# Patient Record
Sex: Female | Born: 1940
Health system: Southern US, Community
[De-identification: ages and names within clinical notes are randomized; demographics above are authoritative.]

## PROBLEM LIST (undated history)

## (undated) DIAGNOSIS — R42 Dizziness and giddiness: Secondary | ICD-10-CM

## (undated) DIAGNOSIS — H3530 Unspecified macular degeneration: Secondary | ICD-10-CM

## (undated) DIAGNOSIS — R569 Unspecified convulsions: Secondary | ICD-10-CM

## (undated) DIAGNOSIS — I679 Cerebrovascular disease, unspecified: Secondary | ICD-10-CM

## (undated) DIAGNOSIS — M8580 Other specified disorders of bone density and structure, unspecified site: Secondary | ICD-10-CM

## (undated) DIAGNOSIS — C801 Malignant (primary) neoplasm, unspecified: Secondary | ICD-10-CM

## (undated) DIAGNOSIS — K5730 Diverticulosis of large intestine without perforation or abscess without bleeding: Secondary | ICD-10-CM

## (undated) DIAGNOSIS — M47812 Spondylosis without myelopathy or radiculopathy, cervical region: Secondary | ICD-10-CM

## (undated) HISTORY — DX: Other specified disorders of bone density and structure, unspecified site: M85.80

## (undated) HISTORY — DX: Dizziness and giddiness: R42

## (undated) HISTORY — PX: COLONOSCOPY: SHX174

## (undated) HISTORY — PX: TUBAL LIGATION: SHX77

## (undated) HISTORY — PX: TONSILLECTOMY: SUR1361

---

## 2000-05-20 ENCOUNTER — Emergency Department (HOSPITAL_COMMUNITY): Admission: EM | Admit: 2000-05-20 | Discharge: 2000-05-20 | Payer: Self-pay | Admitting: *Deleted

## 2003-01-30 ENCOUNTER — Other Ambulatory Visit: Admission: RE | Admit: 2003-01-30 | Discharge: 2003-01-30 | Payer: Self-pay | Admitting: Dermatology

## 2008-11-18 ENCOUNTER — Emergency Department (HOSPITAL_COMMUNITY): Admission: EM | Admit: 2008-11-18 | Discharge: 2008-11-18 | Payer: Self-pay | Admitting: Emergency Medicine

## 2008-11-21 ENCOUNTER — Encounter (HOSPITAL_COMMUNITY): Admission: RE | Admit: 2008-11-21 | Discharge: 2008-12-02 | Payer: Self-pay | Admitting: Emergency Medicine

## 2008-11-21 ENCOUNTER — Ambulatory Visit (HOSPITAL_COMMUNITY): Payer: Self-pay | Admitting: Emergency Medicine

## 2008-11-25 ENCOUNTER — Ambulatory Visit (HOSPITAL_COMMUNITY): Payer: Self-pay | Admitting: Family Medicine

## 2008-12-02 ENCOUNTER — Ambulatory Visit (HOSPITAL_COMMUNITY): Payer: Self-pay | Admitting: Oncology

## 2009-01-12 ENCOUNTER — Ambulatory Visit (HOSPITAL_COMMUNITY): Admission: RE | Admit: 2009-01-12 | Discharge: 2009-01-12 | Payer: Self-pay | Admitting: Family Medicine

## 2009-02-25 ENCOUNTER — Ambulatory Visit (HOSPITAL_COMMUNITY): Admission: RE | Admit: 2009-02-25 | Discharge: 2009-02-25 | Payer: Self-pay | Admitting: Family Medicine

## 2010-03-25 LAB — HM DEXA SCAN

## 2010-03-25 LAB — HM MAMMOGRAPHY: HM Mammogram: NORMAL

## 2010-07-23 ENCOUNTER — Encounter: Payer: Self-pay | Admitting: Family Medicine

## 2010-07-23 DIAGNOSIS — M8580 Other specified disorders of bone density and structure, unspecified site: Secondary | ICD-10-CM | POA: Insufficient documentation

## 2010-08-18 DIAGNOSIS — R42 Dizziness and giddiness: Secondary | ICD-10-CM

## 2010-08-18 HISTORY — DX: Dizziness and giddiness: R42

## 2010-08-19 ENCOUNTER — Emergency Department (HOSPITAL_COMMUNITY): Payer: Medicare Other

## 2010-08-19 ENCOUNTER — Observation Stay (HOSPITAL_COMMUNITY)
Admission: EM | Admit: 2010-08-19 | Discharge: 2010-08-20 | Disposition: A | Discharge status: Home / Self Care | Payer: Medicare Other | Attending: Internal Medicine | Admitting: Internal Medicine

## 2010-08-19 ENCOUNTER — Other Ambulatory Visit: Payer: Self-pay

## 2010-08-19 ENCOUNTER — Encounter (HOSPITAL_COMMUNITY): Payer: Self-pay | Admitting: *Deleted

## 2010-08-19 DIAGNOSIS — Z79899 Other long term (current) drug therapy: Secondary | ICD-10-CM | POA: Insufficient documentation

## 2010-08-19 DIAGNOSIS — R112 Nausea with vomiting, unspecified: Secondary | ICD-10-CM | POA: Diagnosis present

## 2010-08-19 DIAGNOSIS — R109 Unspecified abdominal pain: Secondary | ICD-10-CM | POA: Insufficient documentation

## 2010-08-19 DIAGNOSIS — M47812 Spondylosis without myelopathy or radiculopathy, cervical region: Secondary | ICD-10-CM | POA: Diagnosis present

## 2010-08-19 DIAGNOSIS — J45909 Unspecified asthma, uncomplicated: Secondary | ICD-10-CM | POA: Insufficient documentation

## 2010-08-19 DIAGNOSIS — K5730 Diverticulosis of large intestine without perforation or abscess without bleeding: Secondary | ICD-10-CM | POA: Diagnosis present

## 2010-08-19 DIAGNOSIS — M5030 Other cervical disc degeneration, unspecified cervical region: Secondary | ICD-10-CM | POA: Insufficient documentation

## 2010-08-19 DIAGNOSIS — R0789 Other chest pain: Secondary | ICD-10-CM | POA: Insufficient documentation

## 2010-08-19 DIAGNOSIS — E876 Hypokalemia: Secondary | ICD-10-CM | POA: Diagnosis present

## 2010-08-19 DIAGNOSIS — H8110 Benign paroxysmal vertigo, unspecified ear: Secondary | ICD-10-CM

## 2010-08-19 DIAGNOSIS — R930 Abnormal findings on diagnostic imaging of skull and head, not elsewhere classified: Secondary | ICD-10-CM | POA: Insufficient documentation

## 2010-08-19 DIAGNOSIS — M7989 Other specified soft tissue disorders: Secondary | ICD-10-CM | POA: Insufficient documentation

## 2010-08-19 DIAGNOSIS — Z23 Encounter for immunization: Secondary | ICD-10-CM | POA: Insufficient documentation

## 2010-08-19 DIAGNOSIS — R42 Dizziness and giddiness: Principal | ICD-10-CM | POA: Diagnosis present

## 2010-08-19 DIAGNOSIS — I6789 Other cerebrovascular disease: Secondary | ICD-10-CM | POA: Diagnosis present

## 2010-08-19 HISTORY — DX: Spondylosis without myelopathy or radiculopathy, cervical region: M47.812

## 2010-08-19 HISTORY — DX: Cerebrovascular disease, unspecified: I67.9

## 2010-08-19 HISTORY — DX: Diverticulosis of large intestine without perforation or abscess without bleeding: K57.30

## 2010-08-19 HISTORY — DX: Unspecified convulsions: R56.9

## 2010-08-19 LAB — URINALYSIS, ROUTINE W REFLEX MICROSCOPIC
Bilirubin Urine: NEGATIVE
Glucose, UA: NEGATIVE mg/dL
Hgb urine dipstick: NEGATIVE
Ketones, ur: 15 mg/dL — AB
Leukocytes, UA: NEGATIVE
Specific Gravity, Urine: 1.015 (ref 1.005–1.030)
Urobilinogen, UA: 0.2 mg/dL (ref 0.0–1.0)
pH: 7 (ref 5.0–8.0)

## 2010-08-19 LAB — CBC
Hemoglobin: 13.7 g/dL (ref 12.0–15.0)
RBC: 4.64 MIL/uL (ref 3.87–5.11)
WBC: 11.6 10*3/uL — ABNORMAL HIGH (ref 4.0–10.5)

## 2010-08-19 LAB — CARDIAC PANEL(CRET KIN+CKTOT+MB+TROPI)
CK, MB: 3.7 ng/mL (ref 0.3–4.0)
Relative Index: 2.9 — ABNORMAL HIGH (ref 0.0–2.5)
Total CK: 132 U/L (ref 7–177)

## 2010-08-19 LAB — DIFFERENTIAL
Basophils Absolute: 0.1 10*3/uL (ref 0.0–0.1)
Basophils Relative: 1 % (ref 0–1)
Lymphocytes Relative: 13 % (ref 12–46)
Lymphs Abs: 1.6 10*3/uL (ref 0.7–4.0)
Monocytes Absolute: 1 10*3/uL (ref 0.1–1.0)

## 2010-08-19 LAB — COMPREHENSIVE METABOLIC PANEL
ALT: 17 U/L (ref 0–35)
AST: 20 U/L (ref 0–37)
Albumin: 3.5 g/dL (ref 3.5–5.2)
Alkaline Phosphatase: 53 U/L (ref 39–117)
BUN: 14 mg/dL (ref 6–23)
Calcium: 9.3 mg/dL (ref 8.4–10.5)
Creatinine, Ser: 0.61 mg/dL (ref 0.50–1.10)
Total Protein: 6.7 g/dL (ref 6.0–8.3)

## 2010-08-19 LAB — TSH: TSH: 0.555 u[IU]/mL (ref 0.350–4.500)

## 2010-08-19 LAB — APTT: aPTT: 27 seconds (ref 24–37)

## 2010-08-19 LAB — PROTIME-INR: INR: 1.1 (ref 0.00–1.49)

## 2010-08-19 LAB — MAGNESIUM: Magnesium: 2 mg/dL (ref 1.5–2.5)

## 2010-08-19 MED ORDER — PROMETHAZINE HCL 25 MG/ML IJ SOLN: 12.5000 mg | Freq: Four times a day (QID) | INTRAMUSCULAR | Status: DC | PRN

## 2010-08-19 MED ORDER — LORAZEPAM 2 MG/ML IJ SOLN: 1.0000 mg | INTRAMUSCULAR | Status: DC | PRN

## 2010-08-19 MED ORDER — PNEUMOCOCCAL VAC POLYVALENT 25 MCG/0.5ML IJ INJ
0.5000 mL | INJECTION | INTRAMUSCULAR | Status: AC
  Administered 2010-08-20: 0.5 mL via INTRAMUSCULAR
  Filled 2010-08-19: qty 0.5

## 2010-08-19 MED ORDER — ACETAMINOPHEN 650 MG RE SUPP: 650.0000 mg | Freq: Four times a day (QID) | RECTAL | Status: DC | PRN

## 2010-08-19 MED ORDER — ASPIRIN EC 81 MG PO TBEC
81.0000 mg | DELAYED_RELEASE_TABLET | Freq: Every day | ORAL | Status: DC
  Administered 2010-08-19 – 2010-08-20 (×2): 81 mg via ORAL
  Filled 2010-08-19 (×2): qty 1

## 2010-08-19 MED ORDER — ACETAMINOPHEN 325 MG PO TABS: 650.0000 mg | ORAL_TABLET | Freq: Four times a day (QID) | ORAL | Status: DC | PRN

## 2010-08-19 MED ORDER — METOCLOPRAMIDE HCL 5 MG/ML IJ SOLN
10.0000 mg | Freq: Once | INTRAMUSCULAR | Status: AC
  Administered 2010-08-19: 10 mg via INTRAVENOUS
  Filled 2010-08-19: qty 2

## 2010-08-19 MED ORDER — POTASSIUM CHLORIDE IN NACL 20-0.9 MEQ/L-% IV SOLN
INTRAVENOUS | Status: DC
  Administered 2010-08-19: 16:00:00 via INTRAVENOUS

## 2010-08-19 MED ORDER — ONDANSETRON HCL 4 MG PO TABS: 4.0000 mg | ORAL_TABLET | Freq: Four times a day (QID) | ORAL | Status: DC | PRN

## 2010-08-19 MED ORDER — ONDANSETRON HCL 4 MG/2ML IJ SOLN
4.0000 mg | Freq: Four times a day (QID) | INTRAMUSCULAR | Status: DC | PRN
  Administered 2010-08-19: 4 mg via INTRAVENOUS
  Filled 2010-08-19: qty 2

## 2010-08-19 MED ORDER — PANTOPRAZOLE SODIUM 40 MG IV SOLR
40.0000 mg | Freq: Once | INTRAVENOUS | Status: AC
  Administered 2010-08-19: 40 mg via INTRAVENOUS
  Filled 2010-08-19: qty 40

## 2010-08-19 MED ORDER — ALBUTEROL SULFATE (5 MG/ML) 0.5% IN NEBU: 2.5000 mg | INHALATION_SOLUTION | RESPIRATORY_TRACT | Status: DC | PRN

## 2010-08-19 MED ORDER — MECLIZINE HCL 12.5 MG PO TABS
25.0000 mg | ORAL_TABLET | Freq: Once | ORAL | Status: AC
  Administered 2010-08-19: 25 mg via ORAL
  Filled 2010-08-19: qty 2

## 2010-08-19 MED ORDER — SODIUM CHLORIDE 0.9 % IJ SOLN
INTRAMUSCULAR | Status: AC
  Filled 2010-08-19: qty 10

## 2010-08-19 MED ORDER — DOCUSATE SODIUM 100 MG PO CAPS
100.0000 mg | ORAL_CAPSULE | Freq: Two times a day (BID) | ORAL | Status: DC
  Administered 2010-08-19 – 2010-08-20 (×3): 100 mg via ORAL
  Filled 2010-08-19 (×3): qty 1

## 2010-08-19 MED ORDER — OXYCODONE HCL 5 MG PO TABS: 5.0000 mg | ORAL_TABLET | ORAL | Status: DC | PRN

## 2010-08-19 MED ORDER — MECLIZINE HCL 12.5 MG PO TABS
25.0000 mg | ORAL_TABLET | Freq: Three times a day (TID) | ORAL | Status: DC
  Administered 2010-08-19 – 2010-08-20 (×3): 25 mg via ORAL
  Filled 2010-08-19: qty 1
  Filled 2010-08-19: qty 2
  Filled 2010-08-19: qty 1
  Filled 2010-08-19: qty 2

## 2010-08-19 MED ORDER — SODIUM CHLORIDE 0.9 % IV SOLN
Freq: Once | INTRAVENOUS | Status: AC
  Administered 2010-08-19: 1000 mL via INTRAVENOUS

## 2010-08-19 MED ORDER — POTASSIUM CHLORIDE 10 MEQ/100ML IV SOLN
INTRAVENOUS | Status: AC
  Administered 2010-08-19: 10 meq via INTRAVENOUS
  Filled 2010-08-19: qty 100

## 2010-08-19 MED ORDER — SODIUM CHLORIDE 0.9 % IV SOLN
INTRAVENOUS | Status: DC
  Administered 2010-08-19: 13:00:00 via INTRAVENOUS

## 2010-08-19 MED ORDER — SENNA 8.6 MG PO TABS: 2.0000 | ORAL_TABLET | Freq: Every day | ORAL | Status: DC | PRN

## 2010-08-19 MED ORDER — PROMETHAZINE HCL 12.5 MG PO TABS: 12.5000 mg | ORAL_TABLET | Freq: Four times a day (QID) | ORAL | Status: DC | PRN

## 2010-08-19 MED ORDER — PANTOPRAZOLE SODIUM 40 MG IV SOLR
40.0000 mg | INTRAVENOUS | Status: DC
  Administered 2010-08-19: 40 mg via INTRAVENOUS
  Filled 2010-08-19: qty 40

## 2010-08-19 MED ORDER — POTASSIUM CHLORIDE 10 MEQ/100ML IV SOLN
10.0000 meq | INTRAVENOUS | Status: AC
  Administered 2010-08-19 (×4): 10 meq via INTRAVENOUS
  Filled 2010-08-19 (×3): qty 100

## 2010-08-19 MED ORDER — MORPHINE SULFATE 2 MG/ML IJ SOLN
2.0000 mg | Freq: Once | INTRAMUSCULAR | Status: AC
  Filled 2010-08-19: qty 1

## 2010-08-19 MED ORDER — ONDANSETRON HCL 4 MG/2ML IJ SOLN
4.0000 mg | Freq: Once | INTRAMUSCULAR | Status: AC
  Administered 2010-08-19: 4 mg via INTRAVENOUS
  Filled 2010-08-19: qty 2

## 2010-08-19 NOTE — ED Notes (Signed)
Sudden onset on n/vv. ems started iv 20 left ac & adminstered 4mg  zofran.

## 2010-08-19 NOTE — ED Provider Notes (Signed)
History     CSN: [id number] Arrival date & time: 08/19/2010  1:56 AM  Chief Complaint  Patient presents with  . Emesis  . Abdominal Pain   Patient is a 70 y.o. female presenting with vomiting and abdominal pain. The history is provided by the patient.  Emesis  This is a new (Patient states that she started around 12:30 with severe nausea vomiting with dizziness she felt like the room was moving she's had some abdominal discomfort) problem. The current episode started 6 to 12 hours ago. The problem occurs 5 to 10 times per day. The problem has not changed since onset.There has been no fever. Associated symptoms include abdominal pain. Pertinent negatives include no chills, no cough, no diarrhea and no headaches.  Abdominal Pain The primary symptoms of the illness include abdominal pain, nausea and vomiting. The primary symptoms of the illness do not include fatigue or diarrhea.  Symptoms associated with the illness do not include chills, hematuria, frequency or back pain.    Past Medical History  Diagnosis Date  . Osteopenia   . Asthma     Past Surgical History  Procedure Date  . Tubal ligation   . Tonsillectomy   . Tubal ligation     No family history on file.  History  Substance Use Topics  . Smoking status: Never Smoker   . Smokeless tobacco: Not on file  . Alcohol Use: No    OB History    Grav Para Term Preterm Abortions TAB SAB Ect Mult Living                  Review of Systems  Constitutional: Negative for chills and fatigue.  HENT: Negative for congestion, sinus pressure and ear discharge.   Eyes: Negative for discharge.  Respiratory: Negative for cough.   Cardiovascular: Negative for chest pain.  Gastrointestinal: Positive for nausea, vomiting and abdominal pain. Negative for diarrhea.  Genitourinary: Negative for frequency and hematuria.  Musculoskeletal: Negative for back pain.  Skin: Negative for rash.  Neurological: Positive for dizziness. Negative for  seizures and headaches.  Hematological: Negative.   Psychiatric/Behavioral: Negative for hallucinations.    Physical Exam  BP 138/67  Pulse 87  Temp(Src) 97.5 F (36.4 C) (Oral)  Resp 16  Ht 5\' 3"  (1.6 m)  Wt 130 lb (58.968 kg)  BMI 23.03 kg/m2  SpO2 100%  Physical Exam  Constitutional: She is oriented to person, place, and time. She appears well-developed.  HENT:  Head: Normocephalic and atraumatic.  Eyes: Conjunctivae and EOM are normal. No scleral icterus.       No nystagmus  Neck: Neck supple. No thyromegaly present.  Cardiovascular: Normal rate and regular rhythm.  Exam reveals no gallop and no friction rub.   No murmur heard. Pulmonary/Chest: No stridor. She has no wheezes. She has no rales. She exhibits no tenderness.  Abdominal: She exhibits no distension. There is tenderness. There is no rebound.  Musculoskeletal: Normal range of motion. She exhibits no edema.  Lymphadenopathy:    She has no cervical adenopathy.  Neurological: She is oriented to person, place, and time. A cranial nerve deficit is present.       Patient was ataxic due to the dizziness she had when she stood up  Skin: No rash noted. She is diaphoretic. No erythema.  Psychiatric: She has a normal mood and affect. Her behavior is normal.   Results for orders placed during the hospital encounter of 08/19/10  CBC  Component Value Range   WBC 11.6 (*) 4.0 - 10.5 (K/uL)   RBC 4.64  3.87 - 5.11 (MIL/uL)   Hemoglobin 13.7  12.0 - 15.0 (g/dL)   HCT 29.5  62.1 - 30.8 (%)   MCV 86.4  78.0 - 100.0 (fL)   MCH 29.5  26.0 - 34.0 (pg)   MCHC 34.2  30.0 - 36.0 (g/dL)   RDW 65.7  84.6 - 96.2 (%)   Platelets 404 (*) 150 - 400 (K/uL)  DIFFERENTIAL      Component Value Range   Neutrophils Relative 77  43 - 77 (%)   Neutro Abs 8.9 (*) 1.7 - 7.7 (K/uL)   Lymphocytes Relative 13  12 - 46 (%)   Lymphs Abs 1.6  0.7 - 4.0 (K/uL)   Monocytes Relative 8  3 - 12 (%)   Monocytes Absolute 1.0  0.1 - 1.0 (K/uL)    Eosinophils Relative 1  0 - 5 (%)   Eosinophils Absolute 0.1  0.0 - 0.7 (K/uL)   Basophils Relative 1  0 - 1 (%)   Basophils Absolute 0.1  0.0 - 0.1 (K/uL)  COMPREHENSIVE METABOLIC PANEL      Component Value Range   Sodium 137  135 - 145 (mEq/L)   Potassium 3.4 (*) 3.5 - 5.1 (mEq/L)   Chloride 104  96 - 112 (mEq/L)   CO2 20  19 - 32 (mEq/L)   Glucose, Bld 184 (*) 70 - 99 (mg/dL)   BUN 14  6 - 23 (mg/dL)   Creatinine, Ser 9.52  0.50 - 1.10 (mg/dL)   Calcium 9.3  8.4 - 84.1 (mg/dL)   Total Protein 6.7  6.0 - 8.3 (g/dL)   Albumin 3.5  3.5 - 5.2 (g/dL)   AST 20  0 - 37 (U/L)   ALT 17  0 - 35 (U/L)   Alkaline Phosphatase 53  39 - 117 (U/L)   Total Bilirubin 0.3  0.3 - 1.2 (mg/dL)   GFR calc non Af Amer >60  >60 (mL/min)   GFR calc Af Amer >60  >60 (mL/min)  LIPASE, BLOOD      Component Value Range   Lipase 29  11 - 59 (U/L)  URINALYSIS, ROUTINE W REFLEX MICROSCOPIC      Component Value Range   Color, Urine YELLOW  YELLOW    Appearance HAZY (*) CLEAR    Specific Gravity, Urine 1.015  1.005 - 1.030    pH 7.0  5.0 - 8.0    Glucose, UA NEGATIVE  NEGATIVE (mg/dL)   Hgb urine dipstick NEGATIVE  NEGATIVE    Bilirubin Urine NEGATIVE  NEGATIVE    Ketones, ur 15 (*) NEGATIVE (mg/dL)   Protein, ur NEGATIVE  NEGATIVE (mg/dL)   Urobilinogen, UA 0.2  0.0 - 1.0 (mg/dL)   Nitrite NEGATIVE  NEGATIVE    Leukocytes, UA NEGATIVE  NEGATIVE    Ct Abdomen Pelvis Wo Contrast  08/19/2010  *RADIOLOGY REPORT*  Clinical Data: Nausea vomiting abdominal pain  CT ABDOMEN AND PELVIS WITHOUT CONTRAST  Technique:  Multidetector CT imaging of the abdomen and pelvis was performed following the standard protocol without intravenous contrast.  Comparison: None.  Findings: The liver and gallbladder are normal.  No biliary duct dilatation.  Pancreas and spleen are normal.  No renal calculi or hydronephrosis.  Colonic diverticulosis is present, most severe in the sigmoid region.  No evidence of diverticulitis.  Appendix  is normal. Small hiatal hernia.  No free fluid.  No mass or  adenopathy.  Small calcified uterine fibroid.  Grade 1 slip L4 on L5, negative for fracture.  IMPRESSION: Sigmoid diverticulosis.  Negative for diverticulitis.  No renal calculi.  No acute abnormality.  Original Report Authenticated By: Camelia Phenes, M.D.   Dg Chest 2 View  08/19/2010  *RADIOLOGY REPORT*  Clinical Data: Cough and dizziness  CHEST - 2 VIEW  Comparison: 01/12/2009  Findings: COPD with hyperinflation.  Negative for pneumonia. Negative for heart failure.  IMPRESSION: No active cardiopulmonary disease.  Original Report Authenticated By: Camelia Phenes, M.D.   Ct Head Wo Contrast  08/19/2010  *RADIOLOGY REPORT*  Clinical Data: Nausea vomiting and dizziness.  Abdominal pain  CT HEAD WITHOUT CONTRAST  Technique:  Contiguous axial images were obtained from the base of the skull through the vertex without contrast.  Comparison: None.  Findings: Ventricle size is normal.  Negative for intracranial hemorrhage.  Negative for infarct or mass.  Calvarium is intact.  IMPRESSION: Negative  Original Report Authenticated By: Camelia Phenes, M.D.      ED Course  Procedures  MDM Vertigo,  cva?      Benny Lennert, MD 08/19/10 [phone]

## 2010-08-19 NOTE — Progress Notes (Signed)
The patient is a 70 year old woman with a past medical history significant for asthma and osteopenia, who was admitted this morning for abdominal pain, vertigo, nausea, and vomiting. The patient was briefly seen and interviewed. Laboratory studies and vital signs were reviewed. We will continue current management. In addition, we will order a magnesium level, vitamin B 12 level, free T4 level, and further cardiac enzymes. If her symptoms do not subside with supportive treatment, we will consider consulting neurology and/or gastroenterology if and when applicable.

## 2010-08-19 NOTE — ED Notes (Signed)
Pt reports sudden onset of n/v. ems transported & adminstered 4mg  zofran enroute to er. Pt still vomiting at this time.
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
## 2010-08-19 NOTE — ED Notes (Signed)
Pt states she became dizzy before she started vomiting.

## 2010-08-19 NOTE — ED Notes (Signed)
Pt resting calmly w/ eyes closed. Rise & fall of the chest noted. NAD noted at this time.   

## 2010-08-19 NOTE — ED Notes (Signed)
Pt reported she has been having some pain to the left side of her neck while outside working. Happened yesterday around 1900. Denies any pain at current.

## 2010-08-19 NOTE — ED Notes (Signed)
Gave report to [facility] on ICU

## 2010-08-20 ENCOUNTER — Other Ambulatory Visit: Payer: Self-pay

## 2010-08-20 LAB — COMPREHENSIVE METABOLIC PANEL
ALT: 14 U/L (ref 0–35)
CO2: 21 mEq/L (ref 19–32)
Calcium: 8.5 mg/dL (ref 8.4–10.5)
Creatinine, Ser: 0.65 mg/dL (ref 0.50–1.10)
GFR calc Af Amer: >60 mL/min (ref >60)
GFR calc non Af Amer: >60 mL/min (ref >60)
Glucose, Bld: 99 mg/dL (ref 70–99)

## 2010-08-20 LAB — CBC
Hemoglobin: 12.3 g/dL (ref 12.0–15.0)
MCHC: 32.9 g/dL (ref 30.0–36.0)
RBC: 4.19 MIL/uL (ref 3.87–5.11)

## 2010-08-20 LAB — LIPID PANEL
Cholesterol: 125 mg/dL (ref 0–200)
Triglycerides: 104 mg/dL (ref <150)
VLDL: 21 mg/dL (ref 0–40)

## 2010-08-20 MED ORDER — MECLIZINE HCL 25 MG PO TABS: 25.0000 mg | ORAL_TABLET | Freq: Two times a day (BID) | ORAL | Status: AC

## 2010-08-20 MED ORDER — OMEPRAZOLE 20 MG PO CPDR: 20.0000 mg | DELAYED_RELEASE_CAPSULE | Freq: Every day | ORAL | Status: DC

## 2010-08-20 MED ORDER — ACETAMINOPHEN 325 MG PO TABS: 650.0000 mg | ORAL_TABLET | Freq: Four times a day (QID) | ORAL | Status: AC | PRN

## 2010-08-20 MED ORDER — ONDANSETRON HCL 4 MG PO TABS: 4.0000 mg | ORAL_TABLET | Freq: Four times a day (QID) | ORAL | Status: AC | PRN
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
## 2010-08-20 NOTE — Progress Notes (Signed)
IV removed, site WNL.  Pt given d/c instructions and new prescriptions.  Discussed home care with patient and discussed home medications, patient verbalizes undstanding. F/U appointment will be made by the patient per her request. , pt states they will keep appts. Pt taken to main entrance in wheelchair by staff member.  Pt is A&O and stable at this time.

## 2011-03-18 ENCOUNTER — Ambulatory Visit (AMBULATORY_SURGERY_CENTER): Payer: Medicare Other | Admitting: *Deleted

## 2011-03-18 VITALS — Ht 63.5 in | Wt 135.8 lb

## 2011-03-18 DIAGNOSIS — Z1211 Encounter for screening for malignant neoplasm of colon: Secondary | ICD-10-CM

## 2011-03-18 MED ORDER — PEG-KCL-NACL-NASULF-NA ASC-C 100 G PO SOLR: ORAL | Status: DC

## 2011-03-21 ENCOUNTER — Encounter: Payer: Self-pay | Admitting: Internal Medicine

## 2011-04-01 ENCOUNTER — Encounter: Payer: Self-pay | Admitting: Internal Medicine

## 2011-04-01 ENCOUNTER — Ambulatory Visit (AMBULATORY_SURGERY_CENTER): Payer: Medicare Other | Admitting: Internal Medicine

## 2011-04-01 VITALS — BP 122/61 | HR 84 | Temp 98.6°F | Resp 18 | Ht 63.0 in | Wt 135.0 lb

## 2011-04-01 DIAGNOSIS — Z1211 Encounter for screening for malignant neoplasm of colon: Secondary | ICD-10-CM

## 2011-04-01 MED ORDER — SODIUM CHLORIDE 0.9 % IV SOLN: 500.0000 mL | INTRAVENOUS | Status: DC

## 2011-04-01 NOTE — Op Note (Signed)
[facility] [address] [place], [place]  [address]  COLONOSCOPY PROCEDURE REPORT  PATIENT:  Yvonne Maynard, Yvonne Maynard  MR#:  [id number] BIRTHDATE:  [birth year], 71 yrs. old  GENDER:  female ENDOSCOPIST:  Hedwig Morton. Juanda Chance, MD REF. BY:  Lilyan Punt, M.D. PROCEDURE DATE:  04/01/2011 PROCEDURE:  Colonoscopy 19147 ASA CLASS:  Class II INDICATIONS:  Screening last exam 2002 was normal MEDICATIONS:   MAC sedation, administered by CRNA, propofol (Diprivan) 160 mg  DESCRIPTION OF PROCEDURE:   After the risks and benefits and of the procedure were explained, informed consent was obtained. Digital rectal exam was performed and revealed no rectal masses. The [id number] endoscope was introduced through the anus and advanced to the cecum, which was identified by both the appendix and ileocecal valve.  The quality of the prep was good, using MoviPrep.  The instrument was then slowly withdrawn as the colon was fully examined. <<PROCEDUREIMAGES>>  FINDINGS:  Moderate diverticulosis was found (see image6, image5, and image7). narrow lumen in the sigmoid colon, large hypertrophied folds  This was otherwise a normal examination of the colon (see image8, image3, image2, and image1).   Retroflexed views in the rectum revealed no abnormalities.    The scope was then withdrawn from the patient and the procedure completed.  COMPLICATIONS:  None ENDOSCOPIC IMPRESSION: 1) Moderate diverticulosis 2) Otherwise normal examination RECOMMENDATIONS: 1) High fiber diet. metamucil 1 tsp daily  REPEAT EXAM:  In 10 year(s) for.  ______________________________ Hedwig Morton. Juanda Chance, MD  CC:  n. eSIGNED:   Hedwig Morton. Jazzie Trampe at 04/01/2011 09:10 AM  Hoyle Sauer, [phone]

## 2011-04-01 NOTE — Progress Notes (Signed)
Patient did not have preoperative order for IV antibiotic SSI prophylaxis. (G8918)  Patient did not experience any of the following events: a burn prior to discharge; a fall within the facility; wrong site/side/patient/procedure/implant event; or a hospital transfer or hospital admission upon discharge from the facility. (G8907)  

## 2011-04-01 NOTE — Patient Instructions (Signed)

## 2011-04-04 ENCOUNTER — Telehealth: Payer: Self-pay | Admitting: *Deleted

## 2011-04-04 NOTE — Telephone Encounter (Signed)
  Follow up Call-  Call back number 04/01/2011  Post procedure Call Back phone  # [phone]  Permission to leave phone message Yes     Patient questions:  Do you have a fever, pain , or abdominal swelling? no Pain Score  0 *  Have you tolerated food without any problems? yes  Have you been able to return to your normal activities? yes  Do you have any questions about your discharge instructions: Diet   no Medications  no Follow up visit  no  Do you have questions or concerns about your Care? no  Actions: * If pain score is 4 or above: No action needed, pain <4.

## 2011-05-19 ENCOUNTER — Ambulatory Visit (HOSPITAL_COMMUNITY)
Admission: RE | Admit: 2011-05-19 | Discharge: 2011-05-19 | Disposition: A | Discharge status: Home / Self Care | Payer: Medicare Other | Source: Ambulatory Visit | Attending: Family Medicine | Admitting: Family Medicine

## 2011-05-19 ENCOUNTER — Other Ambulatory Visit: Payer: Self-pay | Admitting: Family Medicine

## 2011-05-19 DIAGNOSIS — R079 Chest pain, unspecified: Secondary | ICD-10-CM | POA: Insufficient documentation

## 2011-05-19 DIAGNOSIS — M810 Age-related osteoporosis without current pathological fracture: Secondary | ICD-10-CM | POA: Insufficient documentation

## 2011-05-19 DIAGNOSIS — R0781 Pleurodynia: Secondary | ICD-10-CM

## 2012-05-23 ENCOUNTER — Other Ambulatory Visit: Payer: Self-pay | Admitting: Family Medicine

## 2012-07-23 ENCOUNTER — Ambulatory Visit (INDEPENDENT_AMBULATORY_CARE_PROVIDER_SITE_OTHER): Payer: Medicare Other | Admitting: Family Medicine

## 2012-07-23 ENCOUNTER — Encounter: Payer: Self-pay | Admitting: Family Medicine

## 2012-07-23 VITALS — BP 118/70 | Temp 99.0°F | Wt 132.0 lb

## 2012-07-23 DIAGNOSIS — J019 Acute sinusitis, unspecified: Secondary | ICD-10-CM

## 2012-07-23 MED ORDER — MONTELUKAST SODIUM 10 MG PO TABS: ORAL_TABLET | ORAL | Status: DC

## 2012-07-23 MED ORDER — FLUTICASONE PROPIONATE 50 MCG/ACT NA SUSP: 2.0000 | Freq: Every day | NASAL | Status: DC

## 2012-07-23 MED ORDER — CEFPROZIL 500 MG PO TABS: 500.0000 mg | ORAL_TABLET | Freq: Two times a day (BID) | ORAL | Status: DC

## 2012-07-23 NOTE — Progress Notes (Signed)
  Subjective:    Patient ID: Yvonne Maynard, female    DOB: Sep 20, 1940, 72 y.o.   MRN: [id number]  Sinusitis This is a recurrent problem. The current episode started more than 1 month ago. Associated symptoms include congestion, coughing, shortness of breath and a sore throat. Pertinent negatives include no ear pain.   patient denies any wheezing difficulty breathing she does relate persistent postnasal drip some soreness in her throat head congestion sometimes clear sometimes mucoid. She denies high fever chills sweats.    Review of Systems  Constitutional: Negative for fever and activity change.  HENT: Positive for congestion, sore throat and rhinorrhea. Negative for ear pain.   Eyes: Negative for discharge.  Respiratory: Positive for cough and shortness of breath. Negative for wheezing.   Cardiovascular: Negative for chest pain.       Objective:   Physical Exam  Nursing note and vitals reviewed. Constitutional: She appears well-developed.  HENT:  Head: Normocephalic.  Nose: Nose normal.  Mouth/Throat: Oropharynx is clear and moist. No oropharyngeal exudate.  Neck: Neck supple.  Cardiovascular: Normal rate and normal heart sounds.   No murmur heard. Pulmonary/Chest: Effort normal and breath sounds normal. She has no wheezes.  Lymphadenopathy:    She has no cervical adenopathy.  Skin: Skin is warm and dry.          Assessment & Plan:  #1 allergic rhinitis-add Flonase use daily. Followup if ongoing troubles. #2 acute sinusitis-Cefzil twice a day 10 days call if ongoing troubles.

## 2012-07-24 ENCOUNTER — Encounter: Payer: Self-pay | Admitting: *Deleted

## 2012-10-22 ENCOUNTER — Telehealth: Payer: Self-pay | Admitting: Family Medicine

## 2012-10-22 DIAGNOSIS — E782 Mixed hyperlipidemia: Secondary | ICD-10-CM

## 2012-10-22 DIAGNOSIS — M8580 Other specified disorders of bone density and structure, unspecified site: Secondary | ICD-10-CM

## 2012-10-22 DIAGNOSIS — Z79899 Other long term (current) drug therapy: Secondary | ICD-10-CM

## 2012-10-22 NOTE — Telephone Encounter (Signed)
Patient has appointment on 11/01/2012 at 8:40 for Wellness Exam and wants to know if she needs to have blood work completed prior to this appointment.  Okay to leave a message on machine.  Please call to advise.

## 2012-10-22 NOTE — Telephone Encounter (Signed)
Metabolic 7, lipid, vitamin D 

## 2012-10-23 NOTE — Telephone Encounter (Signed)
Patient was notified that blood work has been ordered and she can report to [place] to have it drawn.

## 2012-10-23 NOTE — Addendum Note (Signed)
Addended by: Dereck Ligas on: 10/23/2012 08:56 AM   Modules accepted: Orders

## 2012-10-26 LAB — BASIC METABOLIC PANEL
Chloride: 107 mEq/L (ref 96–112)
Glucose, Bld: 89 mg/dL (ref 70–99)
Potassium: 4.8 mEq/L (ref 3.5–5.3)
Sodium: 139 mEq/L (ref 135–145)

## 2012-10-26 LAB — LIPID PANEL: Total CHOL/HDL Ratio: 3.5 Ratio

## 2012-10-27 LAB — VITAMIN D 25 HYDROXY (VIT D DEFICIENCY, FRACTURES): Vit D, 25-Hydroxy: 67 ng/mL (ref 30–89)

## 2012-11-01 ENCOUNTER — Ambulatory Visit (INDEPENDENT_AMBULATORY_CARE_PROVIDER_SITE_OTHER): Payer: Medicare Other | Admitting: Nurse Practitioner

## 2012-11-01 ENCOUNTER — Encounter: Payer: Self-pay | Admitting: Nurse Practitioner

## 2012-11-01 VITALS — BP 124/74 | HR 70 | Ht 63.5 in | Wt 128.8 lb

## 2012-11-01 DIAGNOSIS — M8580 Other specified disorders of bone density and structure, unspecified site: Secondary | ICD-10-CM

## 2012-11-01 DIAGNOSIS — Z01419 Encounter for gynecological examination (general) (routine) without abnormal findings: Secondary | ICD-10-CM

## 2012-11-01 DIAGNOSIS — Z23 Encounter for immunization: Secondary | ICD-10-CM

## 2012-11-01 DIAGNOSIS — M899 Disorder of bone, unspecified: Secondary | ICD-10-CM

## 2012-11-01 DIAGNOSIS — Z0000 Encounter for general adult medical examination without abnormal findings: Secondary | ICD-10-CM

## 2012-11-01 NOTE — Progress Notes (Signed)
  Subjective:    Patient ID: Yvonne Maynard, female    DOB: 04/18/1940, 72 y.o.   MRN: [id number]  HPI Patient passed mini cog and has not had any falls in the last six months. Presents for her wellness checkup. Gets regular visual and dental exams. Also regular skin cancer screening. Recently had surgery on the outside of her nose for removal of skin cancer. Stop using her Flonase after surgery, was unsure if she should continue. Denies having any surgery inside the nose. Sees an allergy specialist. Has a cough for 5 hour in the morning. No fever. Postnasal drainage. Also on OTC antihistamines and Singulair. No shortness of breath. Rare wheezing. Remote history of asthma. No color to her drainage. No acid reflux or heartburn.   Review of Systems  Constitutional: Negative for fever, activity change, appetite change and fatigue.  HENT: Positive for postnasal drip, rhinorrhea and sinus pressure. Negative for dental problem, ear pain and sore throat.   Eyes: Negative for visual disturbance.  Respiratory: Positive for cough and wheezing. Negative for chest tightness and shortness of breath.   Cardiovascular: Negative for chest pain.  Gastrointestinal: Negative for nausea, vomiting, abdominal pain, diarrhea, constipation and blood in stool.  Genitourinary: Negative for dysuria, urgency, frequency, vaginal bleeding, vaginal discharge, enuresis, difficulty urinating and pelvic pain.  Allergic/Immunologic: Positive for environmental allergies.  Neurological: Negative for headaches.  Psychiatric/Behavioral: Negative for sleep disturbance and dysphoric mood. The patient is not nervous/anxious.        Objective:   Physical Exam  Vitals reviewed. Constitutional: She is oriented to person, place, and time. She appears well-developed. No distress.  HENT:  Right Ear: External ear normal.  Left Ear: External ear normal.  Mouth/Throat: Oropharynx is clear and moist. No oropharyngeal exudate.  Neck:  Normal range of motion. Neck supple. No tracheal deviation present. No thyromegaly present.  Cardiovascular: Normal rate, regular rhythm and normal heart sounds.  Exam reveals no gallop.   No murmur heard. Pulmonary/Chest: Effort normal and breath sounds normal. No respiratory distress. She has no wheezes. She has no rales.  Abdominal: Soft. She exhibits no distension. There is no tenderness.  Musculoskeletal: She exhibits no edema.  Lymphadenopathy:    She has no cervical adenopathy.  Neurological: She is alert and oriented to person, place, and time.  Skin: Skin is warm and dry. No rash noted.  Psychiatric: She has a normal mood and affect. Her behavior is normal.   Breast exam: No masses noted, axilla no adenopathy. External GU pale, no lesions noted. Vagina pale no discharge noted. Bimanual exam nontender, no obvious masses. Rectal exam normal, no stool for Hemoccult. TMs clear effusion, no erythema. Pharynx injected with clear PND noted. Neck supple with minimal adenopathy. External nose: Thin faint pink line noted from surgery.    Assessment & Plan:  Well woman exam  Need for prophylactic vaccination and inoculation against influenza  Routine general medical examination at a health care facility - Plan: POC Hemoccult Bld/Stl (3-Cd Home Screen)  Continue regular activity and healthy diet. Continue vitamin D and calcium supplementation. Next physical in one year. Restart Flonase as directed. Call back if rhinitis worsens or persists.

## 2012-11-07 ENCOUNTER — Encounter: Payer: Self-pay | Admitting: Nurse Practitioner

## 2012-11-19 ENCOUNTER — Other Ambulatory Visit (INDEPENDENT_AMBULATORY_CARE_PROVIDER_SITE_OTHER): Payer: Medicare Other | Admitting: *Deleted

## 2012-11-19 DIAGNOSIS — Z0000 Encounter for general adult medical examination without abnormal findings: Secondary | ICD-10-CM

## 2012-11-19 LAB — POC HEMOCCULT BLD/STL (HOME/3-CARD/SCREEN): Card #2 Fecal Occult Blod, POC: NEGATIVE

## 2013-02-14 ENCOUNTER — Other Ambulatory Visit: Payer: Self-pay | Admitting: Family Medicine

## 2013-05-22 ENCOUNTER — Encounter: Payer: Self-pay | Admitting: Family Medicine

## 2013-06-24 ENCOUNTER — Encounter: Payer: Self-pay | Admitting: Family Medicine

## 2013-06-24 ENCOUNTER — Ambulatory Visit (INDEPENDENT_AMBULATORY_CARE_PROVIDER_SITE_OTHER): Payer: Medicare Other | Admitting: Family Medicine

## 2013-06-24 VITALS — BP 110/70 | Temp 97.4°F | Ht 63.0 in | Wt 131.1 lb

## 2013-06-24 DIAGNOSIS — J309 Allergic rhinitis, unspecified: Secondary | ICD-10-CM

## 2013-06-24 DIAGNOSIS — J019 Acute sinusitis, unspecified: Secondary | ICD-10-CM

## 2013-06-24 MED ORDER — CEFPROZIL 500 MG PO TABS
500.0000 mg | ORAL_TABLET | Freq: Two times a day (BID) | ORAL | Status: DC
Start: 2013-06-24 — End: 2013-11-04

## 2013-06-24 MED ORDER — AZELASTINE-FLUTICASONE 137-50 MCG/ACT NA SUSP: NASAL | Status: DC

## 2013-06-24 NOTE — Patient Instructions (Signed)
Yvonne Maynard,  I sent your meds in  The new nasal inhaler may require authorization with the insurance company. This is very typical within the medication. Your pharmacy will help [place] with notifying us.  This new inhaler will take the place of Flonase. It has a antihistamine and a nasal steroid combined.  Let me know if we can do anything else

## 2013-06-24 NOTE — Progress Notes (Signed)
   Subjective:    Patient ID: Yvonne Maynard, female    DOB: 09/09/1940, 73 y.o.   MRN: [id number]  Sinusitis This is a recurrent problem. The current episode started more than 1 month ago. The problem has been gradually worsening since onset. There has been no fever. The pain is mild. Associated symptoms include congestion and coughing. Pertinent negatives include no ear pain or shortness of breath. Past treatments include spray decongestants (antibiotics). The treatment provided no relief.   Patient states she has no other concerns.  Drains at night Tried OTC without help Takes singulair  past 2 weeks with using flonase without help   Review of Systems  Constitutional: Negative for fever and activity change.  HENT: Positive for congestion and rhinorrhea. Negative for ear pain.   Eyes: Negative for discharge.  Respiratory: Positive for cough. Negative for shortness of breath and wheezing.   Cardiovascular: Negative for chest pain.       Objective:   Physical Exam  Nursing note and vitals reviewed. Constitutional: She appears well-developed.  HENT:  Head: Normocephalic.  Nose: Nose normal.  Mouth/Throat: Oropharynx is clear and moist. No oropharyngeal exudate.  Neck: Neck supple.  Cardiovascular: Normal rate and normal heart sounds.   No murmur heard. Pulmonary/Chest: Effort normal and breath sounds normal. She has no wheezes.  Lymphadenopathy:    She has no cervical adenopathy.  Skin: Skin is warm and dry.          Assessment & Plan:  Acute sinusitis antibiotics prescribed Severe allergic rhinitis continue oral antihistamine continue Singulair continue nasal spray and will call in Dymista one spray each nostril twice daily she did not do well enough on Flonase alone

## 2013-07-11 ENCOUNTER — Other Ambulatory Visit: Payer: Self-pay | Admitting: Nurse Practitioner

## 2013-07-16 ENCOUNTER — Telehealth: Payer: Self-pay | Admitting: Family Medicine

## 2013-07-16 NOTE — Telephone Encounter (Signed)
Rx APPROVED for pt's DYMISTA, expires 07/04/2014 through pt's [employer], faxed approval to [facility]

## 2013-10-03 ENCOUNTER — Other Ambulatory Visit: Payer: Self-pay | Admitting: Family Medicine

## 2013-10-25 ENCOUNTER — Telehealth: Payer: Self-pay | Admitting: *Deleted

## 2013-10-25 DIAGNOSIS — R739 Hyperglycemia, unspecified: Secondary | ICD-10-CM

## 2013-10-25 DIAGNOSIS — Z79899 Other long term (current) drug therapy: Secondary | ICD-10-CM

## 2013-10-25 DIAGNOSIS — R5383 Other fatigue: Secondary | ICD-10-CM

## 2013-10-25 DIAGNOSIS — E785 Hyperlipidemia, unspecified: Secondary | ICD-10-CM

## 2013-10-25 DIAGNOSIS — E559 Vitamin D deficiency, unspecified: Secondary | ICD-10-CM

## 2013-10-25 NOTE — Telephone Encounter (Signed)
Left message on voicemail notifying patient that blood work has been ordered.  

## 2013-10-25 NOTE — Telephone Encounter (Signed)
Pt called requesting a lab order, pt wants to come by today and pick it up, pt has a physical scheduled 11/04/2013 along with her husband Trey Bebee. Please advise [phone]

## 2013-10-25 NOTE — Telephone Encounter (Signed)
Hyperglycemia and screeining, CBC,Met 7 , Lipid, A1C,Vit D

## 2013-10-25 NOTE — Telephone Encounter (Addendum)
Patient had Lipid, Liver and met 7 10/14

## 2013-10-28 LAB — CBC WITH DIFFERENTIAL/PLATELET
BASOS ABS: 0.1 10*3/uL (ref 0.0–0.1)
BASOS PCT: 2 % — AB (ref 0–1)
EOS ABS: 0.3 10*3/uL (ref 0.0–0.7)
EOS PCT: 5 % (ref 0–5)
HCT: 41.9 % (ref 36.0–46.0)
Hemoglobin: 14.2 g/dL (ref 12.0–15.0)
Lymphocytes Relative: 27 % (ref 12–46)
Lymphs Abs: 1.4 10*3/uL (ref 0.7–4.0)
MCH: 29.6 pg (ref 26.0–34.0)
MCHC: 33.9 g/dL (ref 30.0–36.0)
MCV: 87.5 fL (ref 78.0–100.0)
Monocytes Absolute: 0.4 10*3/uL (ref 0.1–1.0)
Monocytes Relative: 8 % (ref 3–12)
NEUTROS PCT: 58 % (ref 43–77)
Neutro Abs: 3 10*3/uL (ref 1.7–7.7)
PLATELETS: 481 10*3/uL — AB (ref 150–400)
RBC: 4.79 MIL/uL (ref 3.87–5.11)
RDW: 14.6 % (ref 11.5–15.5)
WBC: 5.2 10*3/uL (ref 4.0–10.5)

## 2013-10-28 LAB — HEMOGLOBIN A1C
HEMOGLOBIN A1C: 5.8 % — AB (ref <5.7)
Mean Plasma Glucose: 120 mg/dL — ABNORMAL HIGH (ref <117)

## 2013-10-29 LAB — BASIC METABOLIC PANEL
BUN: 16 mg/dL (ref 6–23)
CALCIUM: 9.1 mg/dL (ref 8.4–10.5)
CO2: 23 meq/L (ref 19–32)
CREATININE: 0.85 mg/dL (ref 0.50–1.10)
Chloride: 110 mEq/L (ref 96–112)
GLUCOSE: 98 mg/dL (ref 70–99)
Potassium: 4.4 mEq/L (ref 3.5–5.3)
SODIUM: 140 meq/L (ref 135–145)

## 2013-10-29 LAB — LIPID PANEL
CHOL/HDL RATIO: 3.4 ratio
Cholesterol: 146 mg/dL (ref 0–200)
HDL: 43 mg/dL (ref >39)
LDL Cholesterol: 85 mg/dL (ref 0–99)
TRIGLYCERIDES: 88 mg/dL (ref <150)
VLDL: 18 mg/dL (ref 0–40)

## 2013-10-29 LAB — VITAMIN D 25 HYDROXY (VIT D DEFICIENCY, FRACTURES): VIT D 25 HYDROXY: 71 ng/mL (ref 30–89)

## 2013-11-04 ENCOUNTER — Encounter: Payer: Self-pay | Admitting: Nurse Practitioner

## 2013-11-04 ENCOUNTER — Ambulatory Visit (INDEPENDENT_AMBULATORY_CARE_PROVIDER_SITE_OTHER): Payer: Medicare Other | Admitting: Nurse Practitioner

## 2013-11-04 VITALS — BP 122/70 | Ht 63.5 in | Wt 126.2 lb

## 2013-11-04 DIAGNOSIS — Z0000 Encounter for general adult medical examination without abnormal findings: Secondary | ICD-10-CM

## 2013-11-04 DIAGNOSIS — Z23 Encounter for immunization: Secondary | ICD-10-CM

## 2013-11-04 NOTE — Patient Instructions (Signed)
Nasacort AQ as directed 

## 2013-11-07 ENCOUNTER — Encounter: Payer: Self-pay | Admitting: Nurse Practitioner

## 2013-11-07 NOTE — Progress Notes (Signed)
   Subjective:    Patient ID: Yvonne Maynard, female    DOB: 1940/05/31, 73 y.o.   MRN: [id number]  HPI presents for her wellness exam. No vaginal bleeding or pelvic pain. Married, same sexual partner. Active. Healthy diet. Regular vision and dental exams.     Review of Systems  Constitutional: Negative for fever, activity change, appetite change and fatigue.  HENT: Negative for dental problem, ear pain, sinus pressure and sore throat.   Respiratory: Negative for cough, chest tightness, shortness of breath and wheezing.   Cardiovascular: Negative for chest pain.  Gastrointestinal: Negative for nausea, vomiting, abdominal pain, diarrhea, constipation, blood in stool and abdominal distention.  Genitourinary: Negative for dysuria, urgency, frequency, vaginal bleeding, vaginal discharge, enuresis, difficulty urinating, genital sores and pelvic pain.       Objective:   Physical Exam  Vitals reviewed. Constitutional: She is oriented to person, place, and time. She appears well-developed. No distress.  HENT:  Right Ear: External ear normal.  Left Ear: External ear normal.  Mouth/Throat: Oropharynx is clear and moist.  Neck: Normal range of motion. Neck supple. No tracheal deviation present. No thyromegaly present.  Cardiovascular: Normal rate, regular rhythm and normal heart sounds.  Exam reveals no gallop.   No murmur heard. Pulmonary/Chest: Effort normal and breath sounds normal.  Abdominal: Soft. She exhibits no distension. There is no tenderness.  Genitourinary: Uterus normal. No vaginal discharge found.  External GU: no rashes or lesions. Bimanual exam: no tenderness or obvious masses. Rectal exam: no masses; no stool for hemoccult.  Musculoskeletal: She exhibits no edema.  Lymphadenopathy:    She has no cervical adenopathy.  Neurological: She is alert and oriented to person, place, and time.  Skin: Skin is warm and dry. No rash noted.  Psychiatric: She has a normal mood and  affect. Her behavior is normal.  Breast exam: no masses; axillae no adenopathy.  Labs from 10/28/13 reviewed with patient.       Assessment & Plan:  Routine general medical examination at a health care facility - Plan: POC Hemoccult Bld/Stl (3-Cd Home Screen), POC Hemoccult Bld/Stl (3-Cd Home Screen)  Encounter for immunization  Need for vaccination - Plan: Pneumococcal conjugate vaccine 13-valent IM  Recommend daily vitamin D and calcium supplementation. Plans to repeat bone density Spring 2016. Return in about 1 year (around 11/05/2014).

## 2014-01-21 ENCOUNTER — Other Ambulatory Visit: Payer: Self-pay | Admitting: Nurse Practitioner

## 2014-02-04 ENCOUNTER — Telehealth: Payer: Self-pay | Admitting: *Deleted

## 2014-02-05 ENCOUNTER — Encounter: Payer: Self-pay | Admitting: Family Medicine

## 2014-02-05 ENCOUNTER — Ambulatory Visit (INDEPENDENT_AMBULATORY_CARE_PROVIDER_SITE_OTHER): Payer: Medicare Other | Admitting: Family Medicine

## 2014-02-05 VITALS — BP 118/82 | Temp 98.2°F | Ht 63.5 in | Wt 131.2 lb

## 2014-02-05 DIAGNOSIS — R4701 Aphasia: Secondary | ICD-10-CM

## 2014-02-05 DIAGNOSIS — H3530 Unspecified macular degeneration: Secondary | ICD-10-CM | POA: Insufficient documentation

## 2014-02-05 DIAGNOSIS — R739 Hyperglycemia, unspecified: Secondary | ICD-10-CM

## 2014-02-05 LAB — POCT GLYCOSYLATED HEMOGLOBIN (HGB A1C): HEMOGLOBIN A1C: 5.8

## 2014-02-05 NOTE — Patient Instructions (Signed)

## 2014-02-05 NOTE — Progress Notes (Signed)
   Subjective:    Patient ID: Yvonne Maynard, female    DOB: 10-13-1940, 74 y.o.   MRN: [id number]  HPI Patient arrives with complaint of dizzy spells for last 3 weeks- patient has episodes of vertigo and seizure in the past(40 years ago). Patient states she had an aura similar to before a migraine before the dizzy spells.Patient also having sinus issues.  One episode- standing and reaching, she stated that she was standing and reaching and she felt like she was going to pass out she felt a numbness sensation along with weakness in everything seemed to be going dark.  Second episode- sitting visual aura, she stated she started seeing a visual aura on the left side of the visual field. Then she started having difficulty thinking overall with difficulty speaking. She felt like she was going to pass out. She states that this was very similar to how she felt when she had a seizure almost 30 years ago. She states that she had significant a phase you for several minutes then it passed. 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   Review of Systems  Constitutional: Negative for activity change, appetite change and fatigue.  Respiratory: Negative for choking and shortness of breath.   Cardiovascular: Negative for chest pain.  Gastrointestinal: Negative for abdominal pain.  Endocrine: Negative for polydipsia and polyphagia.  Genitourinary: Negative for frequency.  Neurological: Positive for dizziness and speech difficulty. Negative for weakness.  Psychiatric/Behavioral: Negative for confusion.       Objective:   Physical Exam  Constitutional: She is oriented to person,  place, and time. She appears well-nourished. No distress.  Cardiovascular: Normal rate, regular rhythm and normal heart sounds.   No murmur heard. Pulmonary/Chest: Effort normal and breath sounds normal. No respiratory distress.  Musculoskeletal: She exhibits no edema.  Lymphadenopathy:    She has no cervical adenopathy.  Neurological: She is alert and oriented to person, place, and time. She displays normal reflexes. No cranial nerve deficit. She exhibits normal muscle tone. Coordination normal.  Finger to nose is normal. Romberg is negative. Strength bilateral is normal.  Psychiatric: Her behavior is normal.  Vitals reviewed.    History hyperglycemia check A1c to make sure this is not an issue     Assessment & Plan:  Unusual neurologic spells-I doubt that this is a seizure I believe she is safe to drive I do believe she needs neurology referral. Also with the a fascia combined with difficulty thinking and the dizziness along with abnormal MRI from 2012 I believe that this patient needs another MRI to rule out the possibility of a stroke as well as in [place]  30 minutes spent with patient  Also viral sinusitis-saline nasal spray recommended

## 2014-02-12 ENCOUNTER — Ambulatory Visit (HOSPITAL_COMMUNITY)
Admission: RE | Admit: 2014-02-12 | Discharge: 2014-02-12 | Disposition: A | Discharge status: Home / Self Care | Payer: Medicare Other | Source: Ambulatory Visit | Attending: Family Medicine | Admitting: Family Medicine

## 2014-02-12 DIAGNOSIS — R4701 Aphasia: Secondary | ICD-10-CM | POA: Diagnosis not present

## 2014-02-12 DIAGNOSIS — R410 Disorientation, unspecified: Secondary | ICD-10-CM | POA: Diagnosis not present

## 2014-02-12 DIAGNOSIS — R42 Dizziness and giddiness: Secondary | ICD-10-CM | POA: Insufficient documentation

## 2014-02-13 NOTE — Progress Notes (Signed)
Patient notified and verbalized understanding of test results. No further questions.  

## 2014-02-17 ENCOUNTER — Telehealth: Payer: Self-pay | Admitting: *Deleted

## 2014-02-17 NOTE — Telephone Encounter (Signed)
Pt does not want to see specialist at this time. Pt states they called her and she told them she did not want appt. They told her referral was good for 3 months and she could call back if she needed to be seen. Pt states that if symptoms returned she would call us back.

## 2014-02-17 NOTE — Telephone Encounter (Signed)
L[facility]

## 2014-02-17 NOTE — Telephone Encounter (Signed)
[place] 2/1

## 2014-02-17 NOTE — Telephone Encounter (Signed)
If the patient is doing fine and not having any dizzy spells whatsoever she could have [place] cancel the referral. Please talk with the patient see what she would like to do. Certainly if she started having these spells again in the future we would recommend to reset her up. Talk with the patient see what she would like to do.

## 2014-02-17 NOTE — Telephone Encounter (Signed)
Pt was seen for dizzy spells. Mri and ultrasound was ordered. Pt states she was told test normal. Referral to neurologist was put in. Pt does not have appt yet. Pt wants to know does she need to still have appt. She states she is feeling better now. Or should she still go. Husband is coming next tues for appt. Pt states we can just let him know.

## 2014-05-13 ENCOUNTER — Encounter: Payer: Self-pay | Admitting: Family Medicine

## 2014-05-28 ENCOUNTER — Telehealth: Payer: Self-pay | Admitting: Family Medicine

## 2014-05-28 NOTE — Telephone Encounter (Signed)
Bone density stable repeat in 2 years,minimal osteopenia

## 2014-05-29 NOTE — Telephone Encounter (Signed)
Pt.notified

## 2014-05-29 NOTE — Telephone Encounter (Signed)
Telephone call no answer 

## 2014-06-24 ENCOUNTER — Encounter: Payer: Self-pay | Admitting: Family Medicine

## 2014-07-17 ENCOUNTER — Telehealth: Payer: Self-pay

## 2014-07-17 ENCOUNTER — Telehealth: Payer: Self-pay | Admitting: Family Medicine

## 2014-07-17 ENCOUNTER — Encounter: Payer: Self-pay | Admitting: Family Medicine

## 2014-07-17 ENCOUNTER — Ambulatory Visit (INDEPENDENT_AMBULATORY_CARE_PROVIDER_SITE_OTHER): Payer: Medicare Other | Admitting: Family Medicine

## 2014-07-17 VITALS — BP 118/72 | Temp 98.2°F | Ht 63.5 in | Wt 129.0 lb

## 2014-07-17 DIAGNOSIS — J301 Allergic rhinitis due to pollen: Secondary | ICD-10-CM

## 2014-07-17 DIAGNOSIS — R059 Cough, unspecified: Secondary | ICD-10-CM

## 2014-07-17 DIAGNOSIS — R05 Cough: Secondary | ICD-10-CM | POA: Diagnosis not present

## 2014-07-17 DIAGNOSIS — J0100 Acute maxillary sinusitis, unspecified: Secondary | ICD-10-CM

## 2014-07-17 DIAGNOSIS — R053 Chronic cough: Secondary | ICD-10-CM

## 2014-07-17 MED ORDER — CEFDINIR 300 MG PO CAPS: 300.0000 mg | ORAL_CAPSULE | Freq: Two times a day (BID) | ORAL | Status: DC

## 2014-07-17 MED ORDER — ALBUTEROL SULFATE HFA 108 (90 BASE) MCG/ACT IN AERS: 2.0000 | INHALATION_SPRAY | Freq: Two times a day (BID) | RESPIRATORY_TRACT | Status: DC | PRN

## 2014-07-17 MED ORDER — PREDNISONE 10 MG PO TABS: ORAL_TABLET | ORAL | Status: DC

## 2014-07-17 NOTE — Telephone Encounter (Signed)
Pre and post pulmonary function tests at [facility] 07/30/14 @ 9:30 am -arrive at 9:15 am-no smoking, no caffine or respiratory meds 4 hrs before procedure.

## 2014-07-17 NOTE — Telephone Encounter (Signed)
Pt called stating that she is still having a persistent cough and has tried every thing over the counter. Pt states that Dr. Nicki Reaper has mentioned referring her to a specialist for this and is wanting to know if she can be seen by one of the other Drs or if she has to been seen by Dr. Nicki Reaper. Please advice.

## 2014-07-17 NOTE — Progress Notes (Signed)
   Subjective:    Patient ID: Yvonne Maynard, female    DOB: 1940-05-20, 74 y.o.   MRN: [id number]  Cough This is a recurrent problem. The current episode started more than 1 month ago. The problem has been gradually worsening. The problem occurs every few hours (At bedtime). The cough is productive of sputum. Associated symptoms include postnasal drip. Nothing aggravates the symptoms. Treatments tried: Mucinex, coricidin  The treatment provided no relief.   Patients states no other concerns this visit.  Had tried of aal the antihistamines  Stays on singulair with hx of allergies and cong and drainage  Hx of asthma attacks in the past  Pt tried cloricidrin and it did not help, ctook in place of antihist  Has coughed up chunks of stuff and feels hoarse  Patient is frustrated by the persistence of cough. Has had long-standing history of difficulties with postnasal drip and drainage. Some cough with exertion.  Substantial springtime allergies.  Cough has been somewhat productive Review of Systems  HENT: Positive for postnasal drip.   Respiratory: Positive for cough.    no fever no GI symptoms no abdominal pain     Objective:   Physical Exam  Alert no acute distress. HEENT mild nasal congestion pharynx slight erythema neck supple. Lungs no true wheezes heart regular in rhythm.      Assessment & Plan:  Impression 1 acute rhinosinusitis #2 allergic rhinitis #3 history of asthma #4 chronic cough becoming more and more substantial and aggravating for patient plan Omnicef. Maintain allergy medications. Go back to just plain Flonase. Since dimysta helping an extremely pricey. Chest x-ray. Pulmonary function tests. Patient advised to follow-up with Dr. Nicki Reaper after these tests. WSL

## 2014-07-17 NOTE — Telephone Encounter (Signed)
Transferred patient to front desk to schedule appointment.  

## 2014-07-17 NOTE — Telephone Encounter (Signed)
Please schedule PFT (pre and post) for patient. Respiratory department not available during lunch. Order is in the system. Call patient at cell # [phone] or [phone]

## 2014-07-17 NOTE — Telephone Encounter (Signed)
Patient notified Pre and post pulmonary function tests at [facility] 07/30/14 @ 9:30 am -arrive at 9:15 am-no smoking, no caffine or respiratory meds 4 hrs before procedure. Patient verbalized understanding.

## 2014-07-25 ENCOUNTER — Other Ambulatory Visit: Payer: Self-pay | Admitting: Family Medicine

## 2014-07-30 ENCOUNTER — Ambulatory Visit (HOSPITAL_COMMUNITY)
Admission: RE | Admit: 2014-07-30 | Discharge: 2014-07-30 | Disposition: A | Discharge status: Home / Self Care | Payer: Medicare Other | Source: Ambulatory Visit | Attending: Family Medicine | Admitting: Family Medicine

## 2014-07-30 DIAGNOSIS — R05 Cough: Secondary | ICD-10-CM | POA: Insufficient documentation

## 2014-07-30 MED ORDER — ALBUTEROL SULFATE (2.5 MG/3ML) 0.083% IN NEBU
2.5000 mg | INHALATION_SOLUTION | Freq: Once | RESPIRATORY_TRACT | Status: AC
  Administered 2014-07-30: 2.5 mg via RESPIRATORY_TRACT

## 2014-08-22 ENCOUNTER — Encounter: Payer: Self-pay | Admitting: Family Medicine

## 2014-08-22 ENCOUNTER — Ambulatory Visit (INDEPENDENT_AMBULATORY_CARE_PROVIDER_SITE_OTHER): Payer: Medicare Other | Admitting: Family Medicine

## 2014-08-22 VITALS — BP 132/70 | Ht 63.5 in | Wt 131.5 lb

## 2014-08-22 DIAGNOSIS — J019 Acute sinusitis, unspecified: Secondary | ICD-10-CM | POA: Diagnosis not present

## 2014-08-22 DIAGNOSIS — J302 Other seasonal allergic rhinitis: Secondary | ICD-10-CM

## 2014-08-22 MED ORDER — AMOXICILLIN 500 MG PO TABS
500.0000 mg | ORAL_TABLET | Freq: Three times a day (TID) | ORAL | Status: AC
Start: 2014-08-22 — End: 2014-09-04

## 2014-08-22 NOTE — Progress Notes (Signed)
   Subjective:    Patient ID: Yvonne Maynard, female    DOB: 06-02-1940, 74 y.o.   MRN: [id number]  HPI  Patient in today to discuss recent chest x-ray and PFT. Patient states no other concerns this visit. Chest x-ray with peribronchial thickening but no sign and pneumonia pulmonary function tests overall look good patient states she is actually doing better but still having some coughing with postnasal drainage and mucoid drainage antibiotics helped some but she only took them for 10 days Review of Systems  Constitutional: Negative for fever and activity change.  HENT: Positive for congestion and rhinorrhea. Negative for ear pain.   Eyes: Negative for discharge.  Respiratory: Positive for cough. Negative for shortness of breath and wheezing.   Cardiovascular: Negative for chest pain.       Objective:   Physical Exam  Constitutional: She appears well-developed.  HENT:  Head: Normocephalic.  Nose: Nose normal.  Mouth/Throat: Oropharynx is clear and moist. No oropharyngeal exudate.  Neck: Neck supple.  Cardiovascular: Normal rate and normal heart sounds.   No murmur heard. Pulmonary/Chest: Effort normal and breath sounds normal. She has no wheezes.  Lymphadenopathy:    She has no cervical adenopathy.  Skin: Skin is warm and dry.  Nursing note and vitals reviewed.         Assessment & Plan:  Frequent cough probably related to allergic rhinitis postnasal drip and subacute sinusitis 2 weeks of amoxicillin continue allergy medicine and allergy nasal spray  Taper off of albuterol inhaler if return of cough consider steroid inhaler patient denies reflux issues  The patient is to give [place] feedback in one month's time how she is doing so that we can decide if she needs other intervention  I do not feel the patient needs to see allergist currently

## 2014-10-06 ENCOUNTER — Other Ambulatory Visit: Payer: Self-pay | Admitting: Nurse Practitioner

## 2014-10-06 ENCOUNTER — Telehealth: Payer: Self-pay | Admitting: Family Medicine

## 2014-10-06 DIAGNOSIS — Z0000 Encounter for general adult medical examination without abnormal findings: Secondary | ICD-10-CM

## 2014-10-06 NOTE — Telephone Encounter (Signed)
done

## 2014-10-06 NOTE — Telephone Encounter (Signed)
Pt had labs last 10/28/13 has wellness for Oct   Last labs  Lip, BMP, A1C, CBC, Vit D

## 2014-10-06 NOTE — Telephone Encounter (Signed)
Carolyn to [employer]

## 2014-10-06 NOTE — Telephone Encounter (Signed)
Pt seeing Yvonne Maynard for her wellness, forwarding to her

## 2014-10-06 NOTE — Telephone Encounter (Signed)
[facility] 

## 2014-10-07 NOTE — Telephone Encounter (Signed)
Pt.notified

## 2014-10-07 NOTE — Telephone Encounter (Signed)
[facility] 10/07/14

## 2014-11-06 ENCOUNTER — Encounter: Payer: Medicare Other | Admitting: Nurse Practitioner

## 2014-11-07 LAB — LIPID PANEL
CHOL/HDL RATIO: 3.5 ratio (ref 0.0–4.4)
Cholesterol, Total: 164 mg/dL (ref 100–199)
HDL: 47 mg/dL (ref >39)
LDL CALC: 95 mg/dL (ref 0–99)
Triglycerides: 111 mg/dL (ref 0–149)
VLDL Cholesterol Cal: 22 mg/dL (ref 5–40)

## 2014-11-07 LAB — BASIC METABOLIC PANEL
BUN/Creatinine Ratio: 14 (ref 11–26)
BUN: 10 mg/dL (ref 8–27)
CALCIUM: 9.6 mg/dL (ref 8.7–10.3)
CO2: 22 mmol/L (ref 18–29)
CREATININE: 0.72 mg/dL (ref 0.57–1.00)
Chloride: 103 mmol/L (ref 97–106)
GFR calc Af Amer: 96 mL/min/{1.73_m2} (ref >59)
GFR, EST NON AFRICAN AMERICAN: 83 mL/min/{1.73_m2} (ref >59)
Glucose: 113 mg/dL — ABNORMAL HIGH (ref 65–99)
POTASSIUM: 4.7 mmol/L (ref 3.5–5.2)
Sodium: 139 mmol/L (ref 136–144)

## 2014-11-07 LAB — HEPATIC FUNCTION PANEL
ALBUMIN: 4.3 g/dL (ref 3.5–4.8)
ALK PHOS: 66 IU/L (ref 39–117)
ALT: 19 IU/L (ref 0–32)
AST: 23 IU/L (ref 0–40)
BILIRUBIN TOTAL: 0.4 mg/dL (ref 0.0–1.2)
Bilirubin, Direct: 0.11 mg/dL (ref 0.00–0.40)
Total Protein: 6.6 g/dL (ref 6.0–8.5)

## 2014-11-17 ENCOUNTER — Ambulatory Visit (INDEPENDENT_AMBULATORY_CARE_PROVIDER_SITE_OTHER): Payer: Medicare Other | Admitting: Nurse Practitioner

## 2014-11-17 ENCOUNTER — Encounter: Payer: Self-pay | Admitting: Nurse Practitioner

## 2014-11-17 VITALS — BP 118/74 | Ht 63.5 in | Wt 129.5 lb

## 2014-11-17 DIAGNOSIS — Z23 Encounter for immunization: Secondary | ICD-10-CM

## 2014-11-17 DIAGNOSIS — Z0000 Encounter for general adult medical examination without abnormal findings: Secondary | ICD-10-CM

## 2014-11-17 DIAGNOSIS — R7301 Impaired fasting glucose: Secondary | ICD-10-CM | POA: Diagnosis not present

## 2014-11-17 LAB — POCT GLYCOSYLATED HEMOGLOBIN (HGB A1C): Hemoglobin A1C: 5.6

## 2014-11-18 ENCOUNTER — Encounter: Payer: Self-pay | Admitting: Nurse Practitioner

## 2014-11-18 DIAGNOSIS — R7301 Impaired fasting glucose: Secondary | ICD-10-CM | POA: Insufficient documentation

## 2014-11-18 NOTE — Progress Notes (Signed)
   Subjective:    Patient ID: Yvonne Maynard, female    DOB: [birth year], 74 y.o.   MRN: [id number]  HPI presents for her wellness exam. Regular dental exams. Regular vision exams. Recently diagnosed with macular degeneration which is being followed by her specialist. No vaginal bleeding. No pelvic pain. Same sexual partner. Gets daily exercise. Overall healthy diet. No known family history of diabetes.   Review of Systems  Constitutional: Negative for activity change, appetite change and fatigue.  HENT: Negative for dental problem, ear pain, sinus pressure and sore throat.   Respiratory: Negative for cough, chest tightness, shortness of breath and wheezing.   Cardiovascular: Negative for chest pain.  Gastrointestinal: Negative for nausea, vomiting, abdominal pain, diarrhea, constipation and abdominal distention.  Genitourinary: Negative for dysuria, urgency, frequency, vaginal bleeding, vaginal discharge, enuresis, difficulty urinating, genital sores and pelvic pain.       Objective:   Physical Exam  Constitutional: She is oriented to person, place, and time. She appears well-developed. No distress.  HENT:  Right Ear: External ear normal.  Left Ear: External ear normal.  Mouth/Throat: Oropharynx is clear and moist.  Neck: Normal range of motion. Neck supple. No tracheal deviation present. No thyromegaly present.  Cardiovascular: Normal rate, regular rhythm and normal heart sounds.  Exam reveals no gallop.   No murmur heard. Pulmonary/Chest: Effort normal and breath sounds normal.  Abdominal: Soft. She exhibits no distension. There is no tenderness.  Genitourinary: Vagina normal and uterus normal. No vaginal discharge found.  External GU no rashes or lesions. Vagina pale. Bimanual exam no tenderness or obvious masses. Rectal exam no masses, no stool for Hemoccult.  Musculoskeletal: She exhibits no edema.  Lymphadenopathy:    She has no cervical adenopathy.  Neurological: She is  alert and oriented to person, place, and time.  Skin: Skin is warm and dry. No rash noted.  Psychiatric: She has a normal mood and affect. Her behavior is normal.  Vitals reviewed.  Breast exam: Minimal fine nodularity, no masses. Axilla no adenopathy. Reviewed recent lab work from 10/20 with patient. Fasting sugar was 113. Results for orders placed or performed in visit on 11/17/14  POCT HgB A1C  Result Value Ref Range   Hemoglobin A1C 5.6           Assessment & Plan:  Routine general medical examination at a health care facility  Impaired fasting glucose - Plan: POCT HgB A1C   Encourage patient to limit sugar and simple carbs in her diet. Also continue daily calcium and vitamin D. Return in about 1 year (around 11/17/2015) for physical.

## 2014-12-29 ENCOUNTER — Ambulatory Visit (INDEPENDENT_AMBULATORY_CARE_PROVIDER_SITE_OTHER): Payer: Medicare Other | Admitting: Family Medicine

## 2014-12-29 ENCOUNTER — Encounter: Payer: Self-pay | Admitting: Family Medicine

## 2014-12-29 VITALS — BP 110/70 | Temp 98.3°F | Ht 63.5 in | Wt 129.1 lb

## 2014-12-29 DIAGNOSIS — B9689 Other specified bacterial agents as the cause of diseases classified elsewhere: Secondary | ICD-10-CM

## 2014-12-29 DIAGNOSIS — J019 Acute sinusitis, unspecified: Secondary | ICD-10-CM | POA: Diagnosis not present

## 2014-12-29 MED ORDER — CEFDINIR 300 MG PO CAPS: 300.0000 mg | ORAL_CAPSULE | Freq: Two times a day (BID) | ORAL | Status: DC

## 2014-12-29 NOTE — Progress Notes (Signed)
   Subjective:    Patient ID: Yvonne Maynard, female    DOB: 1940-11-09, 74 y.o.   MRN: [id number]  Sinusitis This is a new problem. The current episode started 1 to 4 weeks ago. The problem is unchanged. There has been no fever. Associated symptoms include congestion and coughing. Pertinent negatives include no ear pain or shortness of breath. (Drainage) Past treatments include oral decongestants. The treatment provided no relief.   Patient has no other concerns at this time.  Patient tried over-the-counter allergy medicine that really seem to do much the symptoms been going on for a few weeks Review of Systems  Constitutional: Negative for fever and activity change.  HENT: Positive for congestion and rhinorrhea. Negative for ear pain.   Eyes: Negative for discharge.  Respiratory: Positive for cough. Negative for shortness of breath and wheezing.   Cardiovascular: Negative for chest pain.       Objective:   Physical Exam  Constitutional: She appears well-developed.  HENT:  Head: Normocephalic.  Nose: Nose normal.  Mouth/Throat: Oropharynx is clear and moist. No oropharyngeal exudate.  Neck: Neck supple.  Cardiovascular: Normal rate and normal heart sounds.   No murmur heard. Pulmonary/Chest: Effort normal and breath sounds normal. She has no wheezes.  Lymphadenopathy:    She has no cervical adenopathy.  Skin: Skin is warm and dry.  Nursing note and vitals reviewed.         Assessment & Plan:  Acute rhinosinusitis antibiotics prescribed warning signs discussed follow-up if problems recheck if any deterioration or issues

## 2015-01-30 ENCOUNTER — Encounter: Payer: Self-pay | Admitting: Family Medicine

## 2015-01-30 ENCOUNTER — Ambulatory Visit (HOSPITAL_COMMUNITY)
Admission: RE | Admit: 2015-01-30 | Discharge: 2015-01-30 | Disposition: A | Discharge status: Home / Self Care | Payer: PPO | Source: Ambulatory Visit | Attending: Family Medicine | Admitting: Family Medicine

## 2015-01-30 ENCOUNTER — Ambulatory Visit (INDEPENDENT_AMBULATORY_CARE_PROVIDER_SITE_OTHER): Payer: PPO | Admitting: Family Medicine

## 2015-01-30 VITALS — Temp 98.0°F | Ht 63.5 in | Wt 130.5 lb

## 2015-01-30 DIAGNOSIS — R0789 Other chest pain: Secondary | ICD-10-CM | POA: Diagnosis not present

## 2015-01-30 DIAGNOSIS — J200 Acute bronchitis due to Mycoplasma pneumoniae: Secondary | ICD-10-CM

## 2015-01-30 DIAGNOSIS — J019 Acute sinusitis, unspecified: Secondary | ICD-10-CM | POA: Diagnosis not present

## 2015-01-30 MED ORDER — AZITHROMYCIN 250 MG PO TABS: ORAL_TABLET | ORAL | Status: DC

## 2015-01-30 MED ORDER — MELOXICAM 15 MG PO TABS: 15.0000 mg | ORAL_TABLET | Freq: Every day | ORAL | Status: DC

## 2015-01-30 NOTE — Progress Notes (Signed)
   Subjective:    Patient ID: Yvonne Maynard, female    DOB: 05/18/1940, 75 y.o.   MRN: [id number]  Cough This is a new problem. The current episode started in the past 7 days. The problem has been unchanged. The cough is productive of purulent sputum. Associated symptoms include nasal congestion, postnasal drip and rhinorrhea. Pertinent negatives include no chest pain, chills, ear pain, heartburn, hemoptysis, myalgias, sore throat or shortness of breath. Nothing aggravates the symptoms. She has tried nothing for the symptoms. The treatment provided no relief. There is no history of asthma or bronchiectasis.   She describes having a left lower rib pain and discomfort when she takes a deep breath and when she coughs. She is bringing up some limp. She denies hemoptysis. Denies shortness of breath. She is being treated for a sinus infection back in December which cause significant drainage and coughing but then got better  Review of Systems  Constitutional: Negative for chills.  HENT: Positive for postnasal drip and rhinorrhea. Negative for ear pain and sore throat.   Respiratory: Positive for cough. Negative for hemoptysis and shortness of breath.   Cardiovascular: Negative for chest pain.  Gastrointestinal: Negative for heartburn.  Musculoskeletal: Negative for myalgias.       Objective:   Physical Exam Sinuses nontender eardrums normal throat is normal lungs are clear no crackles heart is regular abdomen soft extremities no edema chest wall some tenderness noted in the left side lower ribs       Assessment & Plan:  Chest x-ray negative. No sign of any pneumonia No sign of any type of fluid. Could be residual discomfort. Could be intercostal muscle strain versus pleuritic pain I doubt pulmonary embolism Antibiotics prescribed anti-inflammatory prescribed if worse over the next several days notify us

## 2015-02-24 ENCOUNTER — Other Ambulatory Visit: Payer: Self-pay | Admitting: Family Medicine

## 2015-03-09 ENCOUNTER — Ambulatory Visit (INDEPENDENT_AMBULATORY_CARE_PROVIDER_SITE_OTHER): Payer: PPO | Admitting: Family Medicine

## 2015-03-09 ENCOUNTER — Encounter: Payer: Self-pay | Admitting: Family Medicine

## 2015-03-09 VITALS — BP 118/76 | Temp 98.0°F | Ht 63.5 in | Wt 129.0 lb

## 2015-03-09 DIAGNOSIS — J019 Acute sinusitis, unspecified: Secondary | ICD-10-CM

## 2015-03-09 MED ORDER — AZELASTINE-FLUTICASONE 137-50 MCG/ACT NA SUSP: NASAL | Status: DC

## 2015-03-09 MED ORDER — AMOXICILLIN-POT CLAVULANATE 875-125 MG PO TABS: 1.0000 | ORAL_TABLET | Freq: Two times a day (BID) | ORAL | Status: DC

## 2015-03-09 NOTE — Progress Notes (Signed)
   Subjective:    Patient ID: Yvonne Maynard, female    DOB: Nov 05, 1940, 75 y.o.   MRN: [id number]  Sinus Problem This is a new problem. The current episode started in the past 7 days. Associated symptoms include congestion and coughing. Pertinent negatives include no ear pain or shortness of breath. Treatments tried: mucinex.    patient relates flulike illness proximally 7-10 days ago then she started having sinus pressure drainage coughing in some discomfort denies wheezing difficulty breathing does have significant allergic rhinitis symptoms   Review of Systems  Constitutional: Positive for fever and fatigue. Negative for activity change.  HENT: Positive for congestion and rhinorrhea. Negative for ear pain.   Eyes: Negative for discharge.  Respiratory: Positive for cough. Negative for shortness of breath and wheezing.   Cardiovascular: Negative for chest pain.       Objective:   Physical Exam  Constitutional: She appears well-developed.  HENT:  Head: Normocephalic.  Nose: Nose normal.  Mouth/Throat: Oropharynx is clear and moist. No oropharyngeal exudate.  Neck: Neck supple.  Cardiovascular: Normal rate and normal heart sounds.   No murmur heard. Pulmonary/Chest: Effort normal and breath sounds normal. She has no wheezes.  Lymphadenopathy:    She has no cervical adenopathy.  Skin: Skin is warm and dry.  Nursing note and vitals reviewed.         Assessment & Plan:   sinus infection antibiotics prescribed warning signs discuss I believe that this was triggered by recent flulike illness about a week and half ago. If progressive troubles or if worse notify us

## 2015-04-20 ENCOUNTER — Encounter: Payer: Self-pay | Admitting: Nurse Practitioner

## 2015-04-20 ENCOUNTER — Ambulatory Visit (INDEPENDENT_AMBULATORY_CARE_PROVIDER_SITE_OTHER): Payer: PPO | Admitting: Nurse Practitioner

## 2015-04-20 VITALS — BP 122/74 | Temp 98.7°F | Ht 63.5 in | Wt 132.0 lb

## 2015-04-20 DIAGNOSIS — N760 Acute vaginitis: Secondary | ICD-10-CM | POA: Diagnosis not present

## 2015-04-20 DIAGNOSIS — A499 Bacterial infection, unspecified: Secondary | ICD-10-CM

## 2015-04-20 DIAGNOSIS — R300 Dysuria: Secondary | ICD-10-CM

## 2015-04-20 DIAGNOSIS — B9689 Other specified bacterial agents as the cause of diseases classified elsewhere: Secondary | ICD-10-CM

## 2015-04-20 LAB — POCT URINALYSIS DIPSTICK
Spec Grav, UA: 1.02
pH, UA: 5

## 2015-04-20 LAB — POCT WET PREP WITH KOH
KOH Prep POC: NEGATIVE
PH WET PREP: 6
TRICHOMONAS UA: NEGATIVE
Yeast Wet Prep HPF POC: NEGATIVE

## 2015-04-20 LAB — POCT UA - MICROSCOPIC ONLY
BACTERIA, U MICROSCOPIC: NEGATIVE
RBC, urine, microscopic: NEGATIVE
WBC, Ur, HPF, POC: NEGATIVE

## 2015-04-20 MED ORDER — METRONIDAZOLE 500 MG PO TABS: 500.0000 mg | ORAL_TABLET | Freq: Two times a day (BID) | ORAL | Status: DC

## 2015-04-20 NOTE — Progress Notes (Signed)
Subjective:  Presents for c/o vaginal discharge x 2 weeks. Dark yellow color. Now having itching and burning. No fever or pelvic pain. Had urinary urgency and frequency for 2 days at the beginning but this has resolved. No back or flank pain. Married, same sexual partner. Has taken 2 courses of antibiotics in the past month.  Objective:   BP 122/74 mmHg  Temp(Src) 98.7 F (37.1 C) (Oral)  Ht 5' 3.5" (1.613 m)  Wt 132 lb (59.875 kg)  BMI 23.01 kg/m2 NAD. Alert, oriented. Lungs clear. Heart RRR. Abdomen soft, nontender. No CVA or flank tenderness. External GU: minimal erythema, no lesions. Vagina: very erythematous with minimal yellow discharge. No CMT. Bimanual exam: no tenderness or obvious masses. Results for orders placed or performed in visit on 04/20/15  POCT urinalysis dipstick  Result Value Ref Range   Color, UA     Clarity, UA     Glucose, UA     Bilirubin, UA     Ketones, UA     Spec Grav, UA 1.020    Blood, UA     pH, UA 5.0    Protein, UA     Urobilinogen, UA     Nitrite, UA     Leukocytes, UA moderate (2+) (A) Negative  POCT Wet Prep with KOH  Result Value Ref Range   Trichomonas, UA Negative    Clue Cells Wet Prep HPF POC rare    Epithelial Wet Prep HPF POC Few Few, Moderate, Many, Too numerous to count   Yeast Wet Prep HPF POC neg    Bacteria Wet Prep HPF POC None None, Few, Too numerous to count   RBC Wet Prep HPF POC rare    WBC Wet Prep HPF POC many    KOH Prep POC Negative    pH, Wet Prep 6.0   POCT UA - Microscopic Only  Result Value Ref Range   WBC, Ur, HPF, POC neg    RBC, urine, microscopic neg    Bacteria, U Microscopic neg    Mucus, UA     Epithelial cells, urine per micros rare    Crystals, Ur, HPF, POC     Casts, Ur, LPF, POC     Yeast, UA       Assessment: Dysuria - Plan: POCT urinalysis dipstick, POCT UA - Microscopic Only  Bacterial vaginosis - Plan: POCT Wet Prep with KOH  Plan:  Meds ordered this encounter  Medications  .  metroNIDAZOLE (FLAGYL) 500 MG tablet    Sig: Take 1 tablet (500 mg total) by mouth 2 (two) times daily with a meal.    Dispense:  14 tablet    Refill:  0    Order Specific Question:  Supervising Provider    Answer:  Mikey Kirschner [[id number]]   Call back if worsens or persists.

## 2015-04-29 DIAGNOSIS — Z803 Family history of malignant neoplasm of breast: Secondary | ICD-10-CM | POA: Diagnosis not present

## 2015-04-29 DIAGNOSIS — Z1231 Encounter for screening mammogram for malignant neoplasm of breast: Secondary | ICD-10-CM | POA: Diagnosis not present

## 2015-06-29 ENCOUNTER — Telehealth: Payer: Self-pay | Admitting: *Deleted

## 2015-07-22 DIAGNOSIS — S50861A Insect bite (nonvenomous) of right forearm, initial encounter: Secondary | ICD-10-CM | POA: Diagnosis not present

## 2015-07-22 DIAGNOSIS — W57XXXA Bitten or stung by nonvenomous insect and other nonvenomous arthropods, initial encounter: Secondary | ICD-10-CM | POA: Diagnosis not present

## 2015-07-22 DIAGNOSIS — L821 Other seborrheic keratosis: Secondary | ICD-10-CM | POA: Diagnosis not present

## 2015-07-22 DIAGNOSIS — D225 Melanocytic nevi of trunk: Secondary | ICD-10-CM | POA: Diagnosis not present

## 2015-07-22 DIAGNOSIS — Z85828 Personal history of other malignant neoplasm of skin: Secondary | ICD-10-CM | POA: Diagnosis not present

## 2015-07-22 DIAGNOSIS — L570 Actinic keratosis: Secondary | ICD-10-CM | POA: Diagnosis not present

## 2015-07-31 ENCOUNTER — Other Ambulatory Visit: Payer: Self-pay | Admitting: Family Medicine

## 2015-09-25 ENCOUNTER — Other Ambulatory Visit: Payer: Self-pay | Admitting: Family Medicine

## 2015-11-17 ENCOUNTER — Ambulatory Visit (INDEPENDENT_AMBULATORY_CARE_PROVIDER_SITE_OTHER): Payer: PPO | Admitting: *Deleted

## 2015-11-17 DIAGNOSIS — Z23 Encounter for immunization: Secondary | ICD-10-CM | POA: Diagnosis not present

## 2015-11-24 DIAGNOSIS — H353112 Nonexudative age-related macular degeneration, right eye, intermediate dry stage: Secondary | ICD-10-CM | POA: Diagnosis not present

## 2015-11-24 DIAGNOSIS — H5213 Myopia, bilateral: Secondary | ICD-10-CM | POA: Diagnosis not present

## 2015-11-24 DIAGNOSIS — H04123 Dry eye syndrome of bilateral lacrimal glands: Secondary | ICD-10-CM | POA: Diagnosis not present

## 2015-11-24 DIAGNOSIS — H01001 Unspecified blepharitis right upper eyelid: Secondary | ICD-10-CM | POA: Diagnosis not present

## 2015-12-08 ENCOUNTER — Telehealth: Payer: Self-pay | Admitting: Nurse Practitioner

## 2015-12-08 ENCOUNTER — Other Ambulatory Visit: Payer: Self-pay | Admitting: Nurse Practitioner

## 2015-12-08 DIAGNOSIS — Z13220 Encounter for screening for lipoid disorders: Secondary | ICD-10-CM

## 2015-12-08 DIAGNOSIS — Z79899 Other long term (current) drug therapy: Secondary | ICD-10-CM

## 2015-12-08 DIAGNOSIS — R7301 Impaired fasting glucose: Secondary | ICD-10-CM

## 2015-12-08 DIAGNOSIS — E876 Hypokalemia: Secondary | ICD-10-CM

## 2015-12-08 NOTE — Telephone Encounter (Signed)
Patient notified blood work has been ordered.

## 2015-12-08 NOTE — Telephone Encounter (Signed)
done

## 2015-12-08 NOTE — Telephone Encounter (Signed)
Pt is needing lab orders sent over for an upcoming wellness visit. Last labs per epic were: lipid,hepatic,and bmp on 11/06/14.

## 2015-12-17 DIAGNOSIS — T148XXA Other injury of unspecified body region, initial encounter: Secondary | ICD-10-CM | POA: Diagnosis not present

## 2015-12-17 DIAGNOSIS — L820 Inflamed seborrheic keratosis: Secondary | ICD-10-CM | POA: Diagnosis not present

## 2015-12-22 DIAGNOSIS — Z13220 Encounter for screening for lipoid disorders: Secondary | ICD-10-CM | POA: Diagnosis not present

## 2015-12-22 DIAGNOSIS — R7301 Impaired fasting glucose: Secondary | ICD-10-CM | POA: Diagnosis not present

## 2015-12-22 DIAGNOSIS — E876 Hypokalemia: Secondary | ICD-10-CM | POA: Diagnosis not present

## 2015-12-22 DIAGNOSIS — Z79899 Other long term (current) drug therapy: Secondary | ICD-10-CM | POA: Diagnosis not present

## 2015-12-23 ENCOUNTER — Other Ambulatory Visit: Payer: Self-pay | Admitting: Family Medicine

## 2015-12-23 LAB — LIPID PANEL
Chol/HDL Ratio: 3.4 ratio units (ref 0.0–4.4)
Cholesterol, Total: 168 mg/dL (ref 100–199)
HDL: 50 mg/dL (ref >39)
LDL CALC: 93 mg/dL (ref 0–99)
Triglycerides: 126 mg/dL (ref 0–149)
VLDL CHOLESTEROL CAL: 25 mg/dL (ref 5–40)

## 2015-12-23 LAB — BASIC METABOLIC PANEL
BUN/Creatinine Ratio: 20 (ref 12–28)
BUN: 15 mg/dL (ref 8–27)
CALCIUM: 9.6 mg/dL (ref 8.7–10.3)
CO2: 27 mmol/L (ref 18–29)
CREATININE: 0.76 mg/dL (ref 0.57–1.00)
Chloride: 103 mmol/L (ref 96–106)
GFR calc Af Amer: 89 mL/min/{1.73_m2} (ref >59)
GFR, EST NON AFRICAN AMERICAN: 78 mL/min/{1.73_m2} (ref >59)
GLUCOSE: 97 mg/dL (ref 65–99)
Potassium: 5 mmol/L (ref 3.5–5.2)
SODIUM: 142 mmol/L (ref 134–144)

## 2015-12-23 LAB — HEMOGLOBIN A1C
ESTIMATED AVERAGE GLUCOSE: 114 mg/dL
HEMOGLOBIN A1C: 5.6 % (ref 4.8–5.6)

## 2015-12-23 LAB — HEPATIC FUNCTION PANEL
ALBUMIN: 4.6 g/dL (ref 3.5–4.8)
ALT: 20 IU/L (ref 0–32)
AST: 19 IU/L (ref 0–40)
Alkaline Phosphatase: 68 IU/L (ref 39–117)
BILIRUBIN TOTAL: 0.4 mg/dL (ref 0.0–1.2)
BILIRUBIN, DIRECT: 0.11 mg/dL (ref 0.00–0.40)
TOTAL PROTEIN: 6.8 g/dL (ref 6.0–8.5)

## 2015-12-31 ENCOUNTER — Ambulatory Visit (INDEPENDENT_AMBULATORY_CARE_PROVIDER_SITE_OTHER): Payer: PPO | Admitting: Nurse Practitioner

## 2015-12-31 VITALS — BP 102/72 | Ht 63.5 in | Wt 131.6 lb

## 2015-12-31 DIAGNOSIS — Z0000 Encounter for general adult medical examination without abnormal findings: Secondary | ICD-10-CM | POA: Diagnosis not present

## 2015-12-31 DIAGNOSIS — M7711 Lateral epicondylitis, right elbow: Secondary | ICD-10-CM | POA: Diagnosis not present

## 2015-12-31 NOTE — Patient Instructions (Signed)
Tennis Elbow Tennis elbow (lateral epicondylitis) is inflammation of the outer tendons of your forearm close to your elbow. Your tendons attach your muscles to your bones. The outer tendons of your forearm are used to extend your wrist, and they attach on the outside part of your elbow. Tennis elbow is often found in people who play tennis, but anyone may get the condition from repeatedly extending the wrist or turning the forearm. What are the causes? This condition is caused by repeatedly extending your wrist and using your hands. It can result from sports or work that requires repetitive forearm movements. Tennis elbow may also be caused by an injury. What increases the risk? You have a higher risk of developing tennis elbow if you play tennis or another racquet sport. You also have a higher risk if you frequently use your hands for work. This condition is also more likely to develop in:  Musicians.  Carpenters, painters, and plumbers.  Cooks.  Cashiers.  People who work in factories.  Construction workers.  Butchers.  People who use computers. What are the signs or symptoms? Symptoms of this condition include:  Pain and tenderness in your forearm and the outer part of your elbow. You may only feel the pain when you use your arm, or you may feel it even when you are not using your arm.  A burning feeling that runs from your elbow through your arm.  Weak grip in your hands. How is this diagnosed? This condition may be diagnosed by medical history and physical exam. You may also have other tests, including:  X-rays.  MRI. How is this treated? Your health care provider will recommend lifestyle adjustments, such as resting and icing your arm. Treatment may also include:  Medicines for inflammation. This may include shots of cortisone if your pain continues.  Physical therapy. This may include massage or exercises.  An elbow brace. Surgery may eventually be recommended if  your pain does not go away with treatment. Follow these instructions at home: Activity  Rest your elbow and wrist as directed by your health care provider. Try to avoid any activities that caused the problem until your health care provider says that you can do them again.  If a physical therapist teaches you exercises, do all of them as directed.  If you lift an object, lift it with your palm facing upward. This lowers the stress on your elbow. Lifestyle  If your tennis elbow is caused by sports, check your equipment and make sure that:  You are using it correctly.  It is the best fit for you.  If your tennis elbow is caused by work, take breaks frequently, if you are able. Talk with your manager about how to best perform tasks in a way that is safe.  If your tennis elbow is caused by computer use, talk with your manager about any changes that can be made to your work environment. General instructions  If directed, apply ice to the painful area:  Put ice in a plastic bag.  Place a towel between your skin and the bag.  Leave the ice on for 20 minutes, 2-3 times per day.  Take medicines only as directed by your health care provider.  If you were given a brace, wear it as directed by your health care provider.  Keep all follow-up visits as directed by your health care provider. This is important. Contact a health care provider if:  Your pain does not get better with treatment.    Your pain gets worse.  You have numbness or weakness in your forearm, hand, or fingers. This information is not intended to replace advice given to you by your health care provider. Make sure you discuss any questions you have with your health care provider. Document Released: 01/03/2005 Document Revised: 09/03/2015 Document Reviewed: 12/30/2013 Elsevier Interactive Patient Education  2017 [employer] Inc[employer]  

## 2016-01-01 ENCOUNTER — Encounter: Payer: Self-pay | Admitting: Nurse Practitioner

## 2016-01-01 NOTE — Progress Notes (Signed)
   Subjective:    Patient ID: Yvonne Maynard, female    DOB: Apr 18, 1940, 75 y.o.   MRN: [id number]  HPI presents for her wellness exam. No pelvic pain or bleeding. Same sexual partner. Regular vision and dental exams. Will get her mammogram and bone density as [place] this summer where her sister works. C/o pain in the lateral elbow, worse with certain movements. Began after cleaning multiple windows in her home. Had mammogram last year but no record in the chart. States it was normal. Stays active. Overall healthy diet.     Review of Systems  Constitutional: Negative for activity change, appetite change and fatigue.  HENT: Negative for dental problem, ear pain, sinus pressure and sore throat.   Respiratory: Negative for cough, chest tightness, shortness of breath and wheezing.   Cardiovascular: Negative for chest pain.  Gastrointestinal: Negative for abdominal distention, abdominal pain, constipation, diarrhea, nausea and vomiting.  Genitourinary: Negative for difficulty urinating, dysuria, enuresis, frequency, genital sores, pelvic pain, urgency, vaginal bleeding and vaginal discharge.       Objective:   Physical Exam  Constitutional: She is oriented to person, place, and time. She appears well-developed. No distress.  HENT:  Right Ear: External ear normal.  Left Ear: External ear normal.  Mouth/Throat: Oropharynx is clear and moist.  Neck: Normal range of motion. Neck supple. No tracheal deviation present. No thyromegaly present.  Cardiovascular: Normal rate, regular rhythm and normal heart sounds.  Exam reveals no gallop.   No murmur heard. Pulmonary/Chest: Effort normal and breath sounds normal.  Abdominal: Soft. She exhibits no distension. There is no tenderness.  Genitourinary:  Genitourinary Comments: Defers pelvic and rectal exams today.   Musculoskeletal:  Pain along the right lateral epicondyle. Can perform full active ROM right elbow. Discomfort with rotation.     Lymphadenopathy:    She has no cervical adenopathy.  Neurological: She is alert and oriented to person, place, and time.  Skin: Skin is warm and dry. No rash noted.  Psychiatric: She has a normal mood and affect. Her behavior is normal.  Vitals reviewed. Breast exam: dense tissue, more on the right. No obvious masses. Axillae no adenopathy.         Assessment & Plan:  Annual physical exam - Plan: POC Hemoccult Bld/Stl (3-Cd Home Screen)  Lateral epicondylitis, right elbow  OTC NSAIDs as directed for elbow pain. Arm band as directed. Given written information. Call back in 3-4 weeks if no better. Recommend office visit for injection at that time. Continue vitamin D and calcium.

## 2016-01-28 ENCOUNTER — Other Ambulatory Visit: Payer: Self-pay | Admitting: Family Medicine

## 2016-05-02 ENCOUNTER — Encounter: Payer: Self-pay | Admitting: Family Medicine

## 2016-05-02 DIAGNOSIS — M8589 Other specified disorders of bone density and structure, multiple sites: Secondary | ICD-10-CM | POA: Diagnosis not present

## 2016-05-02 DIAGNOSIS — Z1231 Encounter for screening mammogram for malignant neoplasm of breast: Secondary | ICD-10-CM | POA: Diagnosis not present

## 2016-05-09 ENCOUNTER — Telehealth: Payer: Self-pay | Admitting: Family Medicine

## 2016-05-09 NOTE — Telephone Encounter (Signed)
Mammogram normal

## 2016-05-09 NOTE — Telephone Encounter (Signed)
Review screening mammogram results in results folder. °

## 2016-05-11 ENCOUNTER — Telehealth: Payer: Self-pay | Admitting: Family Medicine

## 2016-05-11 ENCOUNTER — Encounter: Payer: Self-pay | Admitting: Family Medicine

## 2016-06-18 NOTE — Telephone Encounter (Signed)
error 

## 2016-07-19 ENCOUNTER — Other Ambulatory Visit: Payer: Self-pay | Admitting: Family Medicine

## 2016-08-17 ENCOUNTER — Other Ambulatory Visit: Payer: Self-pay | Admitting: Family Medicine

## 2016-09-16 ENCOUNTER — Other Ambulatory Visit: Payer: Self-pay | Admitting: Family Medicine

## 2016-10-11 ENCOUNTER — Other Ambulatory Visit: Payer: Self-pay | Admitting: Family Medicine

## 2016-10-20 NOTE — Telephone Encounter (Signed)
Entered in error

## 2016-11-11 ENCOUNTER — Other Ambulatory Visit: Payer: Self-pay | Admitting: Family Medicine

## 2016-12-05 ENCOUNTER — Telehealth: Payer: Self-pay | Admitting: Family Medicine

## 2016-12-05 DIAGNOSIS — Z13220 Encounter for screening for lipoid disorders: Secondary | ICD-10-CM

## 2016-12-05 DIAGNOSIS — E876 Hypokalemia: Secondary | ICD-10-CM

## 2016-12-05 DIAGNOSIS — Z79899 Other long term (current) drug therapy: Secondary | ICD-10-CM

## 2016-12-05 DIAGNOSIS — R7301 Impaired fasting glucose: Secondary | ICD-10-CM

## 2016-12-05 NOTE — Telephone Encounter (Signed)
Blood work ordered in Epic. Patient notified. 

## 2016-12-05 NOTE — Telephone Encounter (Signed)
Pt is requesting labs to be sent over for an upcoming physical with Hoyle Sauer.

## 2016-12-05 NOTE — Telephone Encounter (Signed)
Lipid, liver, met 7 and Hgb A1C (impaired fasting glucose)

## 2016-12-23 DIAGNOSIS — R7301 Impaired fasting glucose: Secondary | ICD-10-CM | POA: Diagnosis not present

## 2016-12-23 DIAGNOSIS — Z13220 Encounter for screening for lipoid disorders: Secondary | ICD-10-CM | POA: Diagnosis not present

## 2016-12-23 DIAGNOSIS — E876 Hypokalemia: Secondary | ICD-10-CM | POA: Diagnosis not present

## 2016-12-23 DIAGNOSIS — Z79899 Other long term (current) drug therapy: Secondary | ICD-10-CM | POA: Diagnosis not present

## 2016-12-24 LAB — BASIC METABOLIC PANEL
BUN/Creatinine Ratio: 15 (ref 12–28)
BUN: 13 mg/dL (ref 8–27)
CALCIUM: 9.7 mg/dL (ref 8.7–10.3)
CHLORIDE: 105 mmol/L (ref 96–106)
CO2: 26 mmol/L (ref 20–29)
CREATININE: 0.88 mg/dL (ref 0.57–1.00)
GFR calc non Af Amer: 64 mL/min/{1.73_m2} (ref >59)
GFR, EST AFRICAN AMERICAN: 74 mL/min/{1.73_m2} (ref >59)
Glucose: 101 mg/dL — ABNORMAL HIGH (ref 65–99)
Potassium: 4.7 mmol/L (ref 3.5–5.2)
Sodium: 143 mmol/L (ref 134–144)

## 2016-12-24 LAB — HEPATIC FUNCTION PANEL
ALBUMIN: 4.3 g/dL (ref 3.5–4.8)
ALT: 20 IU/L (ref 0–32)
AST: 20 IU/L (ref 0–40)
Alkaline Phosphatase: 74 IU/L (ref 39–117)
BILIRUBIN TOTAL: 0.3 mg/dL (ref 0.0–1.2)
Bilirubin, Direct: 0.09 mg/dL (ref 0.00–0.40)
TOTAL PROTEIN: 6.8 g/dL (ref 6.0–8.5)

## 2016-12-24 LAB — HEMOGLOBIN A1C
Est. average glucose Bld gHb Est-mCnc: 114 mg/dL
Hgb A1c MFr Bld: 5.6 % (ref 4.8–5.6)

## 2016-12-24 LAB — LIPID PANEL
CHOL/HDL RATIO: 3.3 ratio (ref 0.0–4.4)
Cholesterol, Total: 149 mg/dL (ref 100–199)
HDL: 45 mg/dL (ref >39)
LDL CALC: 81 mg/dL (ref 0–99)
TRIGLYCERIDES: 114 mg/dL (ref 0–149)
VLDL Cholesterol Cal: 23 mg/dL (ref 5–40)

## 2017-01-04 ENCOUNTER — Encounter: Payer: Self-pay | Admitting: Nurse Practitioner

## 2017-01-04 ENCOUNTER — Ambulatory Visit (INDEPENDENT_AMBULATORY_CARE_PROVIDER_SITE_OTHER): Payer: PPO | Admitting: Nurse Practitioner

## 2017-01-04 VITALS — BP 110/64 | HR 69 | Ht 63.5 in | Wt 131.6 lb

## 2017-01-04 DIAGNOSIS — Z01419 Encounter for gynecological examination (general) (routine) without abnormal findings: Secondary | ICD-10-CM

## 2017-01-04 MED ORDER — MONTELUKAST SODIUM 10 MG PO TABS: 10.0000 mg | ORAL_TABLET | Freq: Every day | ORAL | 5 refills | Status: DC

## 2017-01-05 ENCOUNTER — Encounter: Payer: Self-pay | Admitting: Nurse Practitioner

## 2017-01-05 NOTE — Progress Notes (Signed)
   Subjective:    Patient ID: Yvonne Maynard, female    DOB: November 20, 1940, 76 y.o.   MRN: [id number]  HPI presents for her wellness exam.  Same sexual partner.  No vaginal bleeding or pelvic pain.  Regular activity/exercise.  Regular vision and dental exams.  Had her mammogram about a year ago at [place] where her sister works.  Continues to get those yearly.  Has had a reaction to tetanus booster in the past, has taken a half of the vaccine with no reaction.  Unsure when she had this. Depression screen [facility] 2/9 01/05/2017 11/17/2014  Decreased Interest 0 0  Down, Depressed, Hopeless 0 0  PHQ - 2 Score 0 0       Review of Systems  Constitutional: Negative for activity change, appetite change and fatigue.  HENT: Negative for dental problem, ear pain, sinus pressure and sore throat.   Respiratory: Negative for cough, chest tightness, shortness of breath and wheezing.   Cardiovascular: Negative for chest pain.  Gastrointestinal: Negative for abdominal distention, abdominal pain, blood in stool, constipation, diarrhea, nausea and vomiting.  Genitourinary: Negative for difficulty urinating, dysuria, enuresis, frequency, genital sores, pelvic pain, urgency, vaginal bleeding and vaginal discharge.       Objective:   Physical Exam  Constitutional: She is oriented to person, place, and time. She appears well-developed. No distress.  HENT:  Right Ear: External ear normal.  Left Ear: External ear normal.  Mouth/Throat: Oropharynx is clear and moist.  Neck: Normal range of motion. Neck supple. No tracheal deviation present. No thyromegaly present.  Cardiovascular: Normal rate, regular rhythm and normal heart sounds. Exam reveals no gallop.  No murmur heard. Pulmonary/Chest: Effort normal and breath sounds normal. Right breast exhibits no inverted nipple, no mass, no skin change and no tenderness. Left breast exhibits no inverted nipple, no mass, no skin change and no tenderness. Breasts are  symmetrical.  Axillae no adenopathy.  Abdominal: Soft. She exhibits no distension. There is no tenderness.  Genitourinary: Vagina normal and uterus normal. No vaginal discharge found.  Genitourinary Comments: External GU no rashes or lesions.  Vagina no discharge.  Cervix normal limit in appearance.  Bimanual exam no CMT, no tenderness or obvious masses.  Musculoskeletal: She exhibits no edema.  Lymphadenopathy:    She has no cervical adenopathy.  Neurological: She is alert and oriented to person, place, and time.  Skin: Skin is warm and dry. No rash noted.  Psychiatric: She has a normal mood and affect. Her behavior is normal.  Vitals reviewed.         Assessment & Plan:  Well woman exam  Encouraged daily vitamin D and calcium supplementation.  Continue yearly mammograms as planned. Meds ordered this encounter  Medications  . montelukast (SINGULAIR) 10 MG tablet    Sig: Take 1 tablet (10 mg total) by mouth daily.    Dispense:  30 tablet    Refill:  5    Order Specific Question:   Supervising Provider    Answer:   Mikey Kirschner [[id number]]   Return in about 1 year (around 01/04/2018) for physical.

## 2017-01-10 IMAGING — MR MR HEAD W/O CM
6 of 13 series · 15 of 48 positions shown · non-contrast
Comparison: MRI brain 08/19/2010

CLINICAL DATA: Episodes of dizziness and inability to speak.
Confused. Abnormal vision in the left eye. Aphasia.

EXAM:
MRI HEAD WITHOUT CONTRAST
TECHNIQUE: Multiplanar, multiecho pulse sequences of the brain and surrounding
structures were obtained without intravenous contrast.

[Series 2: T1 · sagittal · 5.0mm · 0.43mm/px · 1 of 21 slices shown (1 of 2)]
[im 1/21]
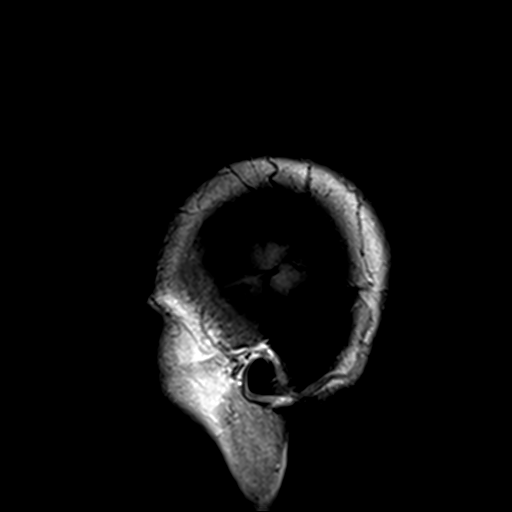

[Series 5: T2 · axial · 5.0mm · 0.49mm/px · 1 of 23 slices shown (1 of 3)]
[im 1/23]
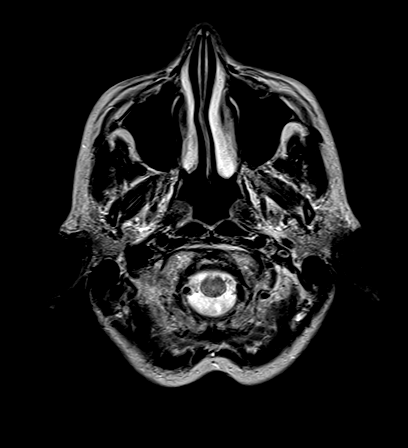

[Series 6: FLAIR · axial · 5.0mm · 0.36mm/px · z∈[+6,+149]mm · 2 of 23 slices shown]
[im 1/23]
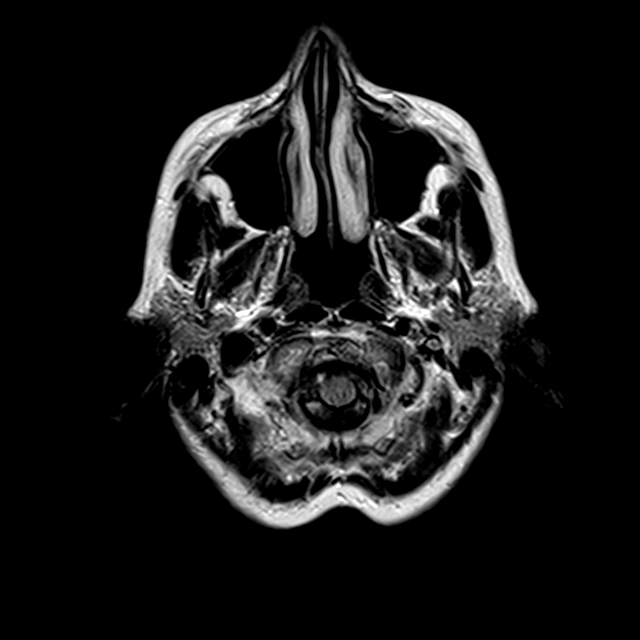
[im 23/23]
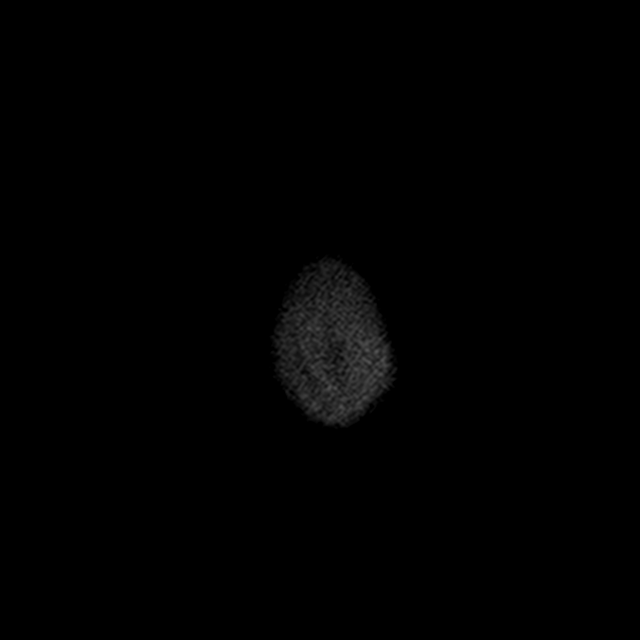

[Series 7: T1 · axial · 2.0mm · 0.45mm/px · z∈[-16,+124]mm · 6 of 86 slices shown (2 of 2)]
[im 1/86]
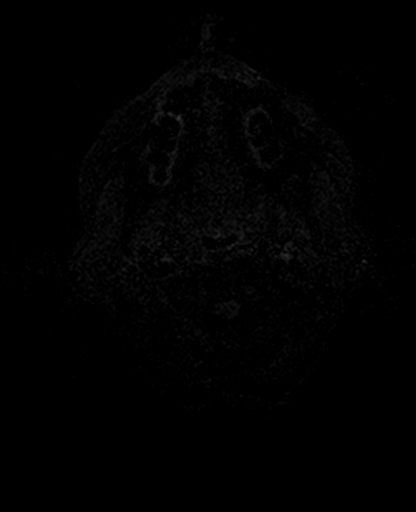
[im 15/86]
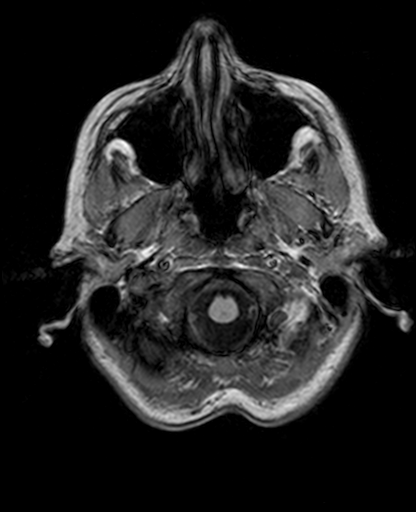
[im 29/86]
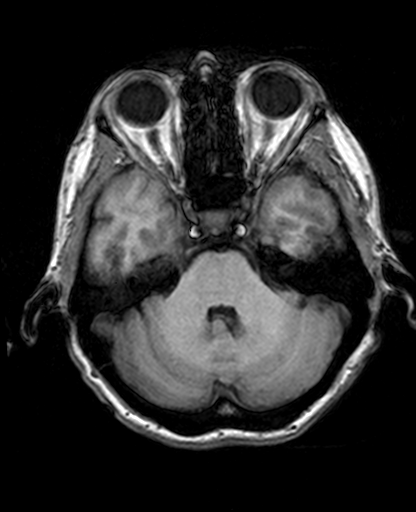
[im 43/86]
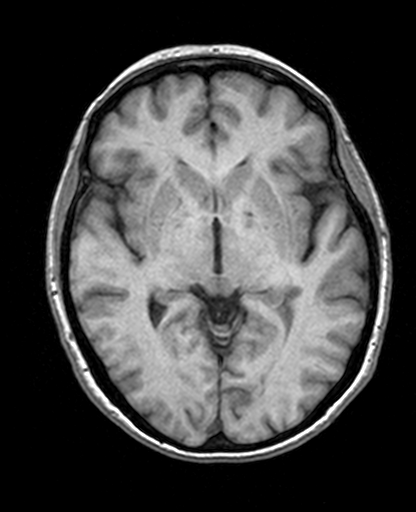
[im 57/86]
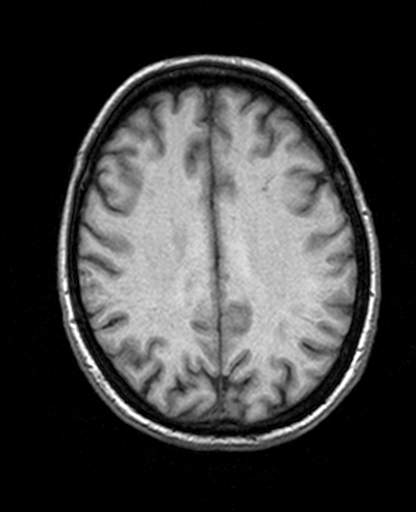
[im 71/86]
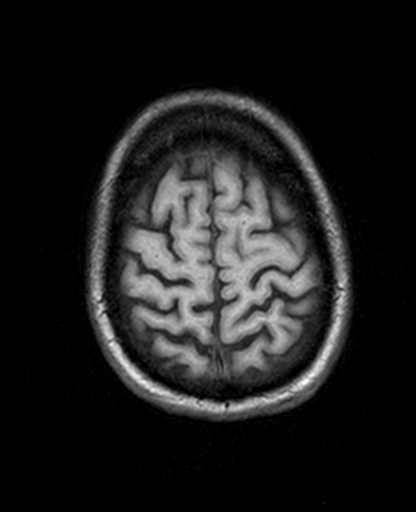

[Series 9: T2 · coronal · 5.0mm · 0.47mm/px · 2 of 24 slices shown (2 of 3)]
[im 1/24]
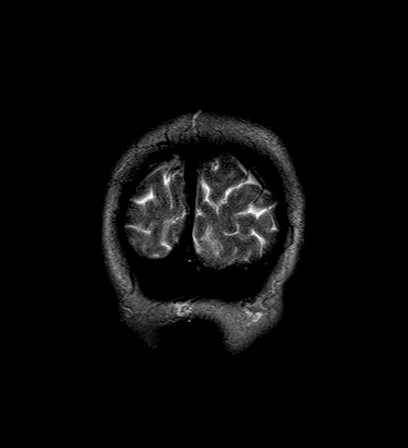
[im 24/24]
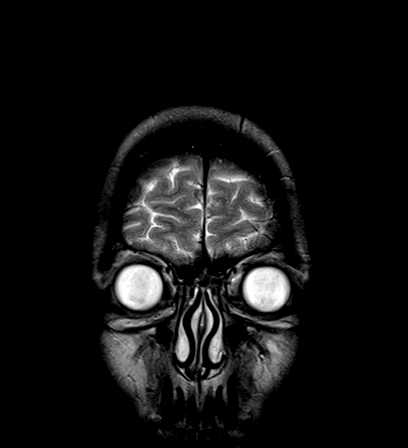

[Series 10: T2 · coronal · 3.0mm · 0.22mm/px · 3 of 36 slices shown (3 of 3)]
[im 1/36]
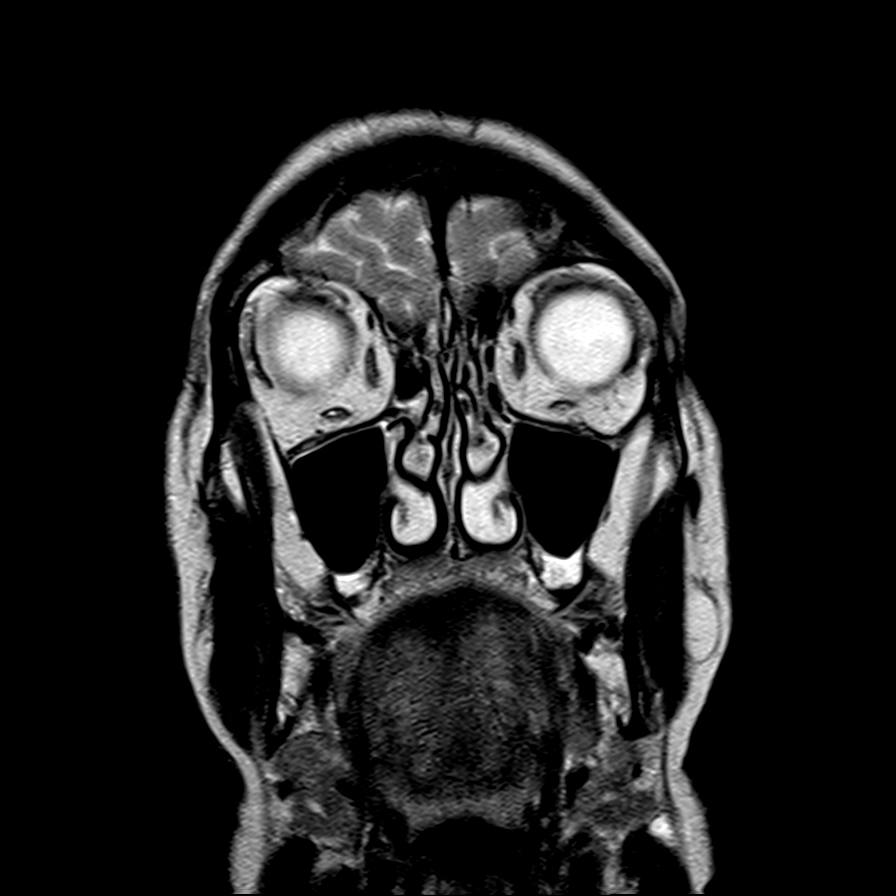
[im 18/36]
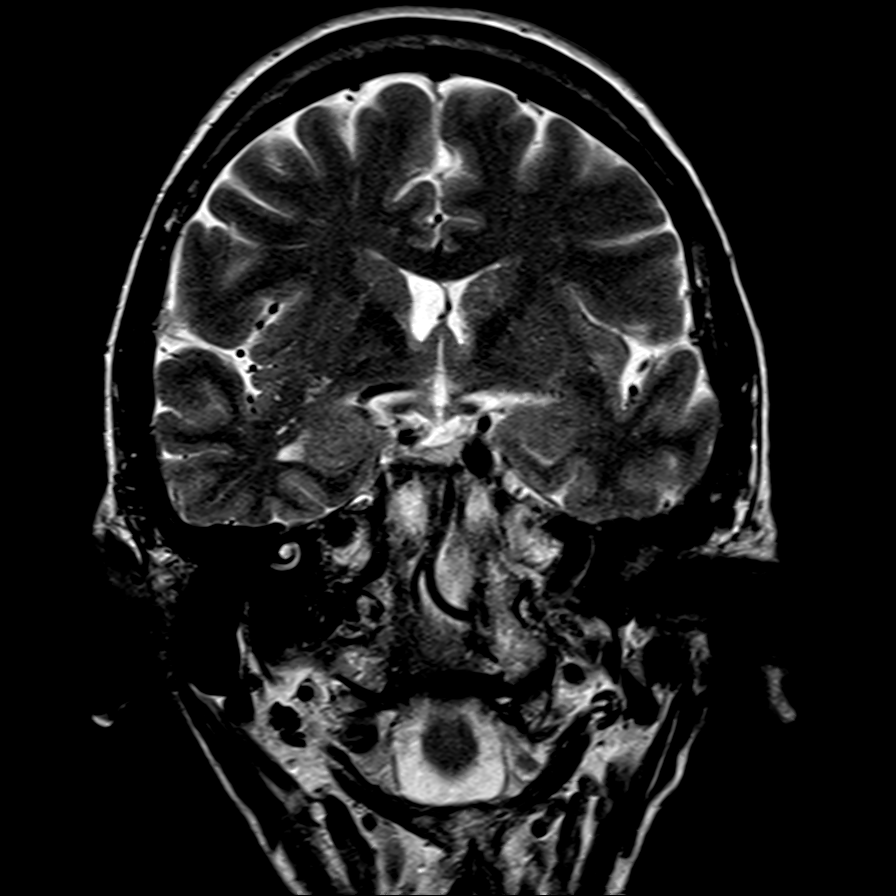
[im 36/36]
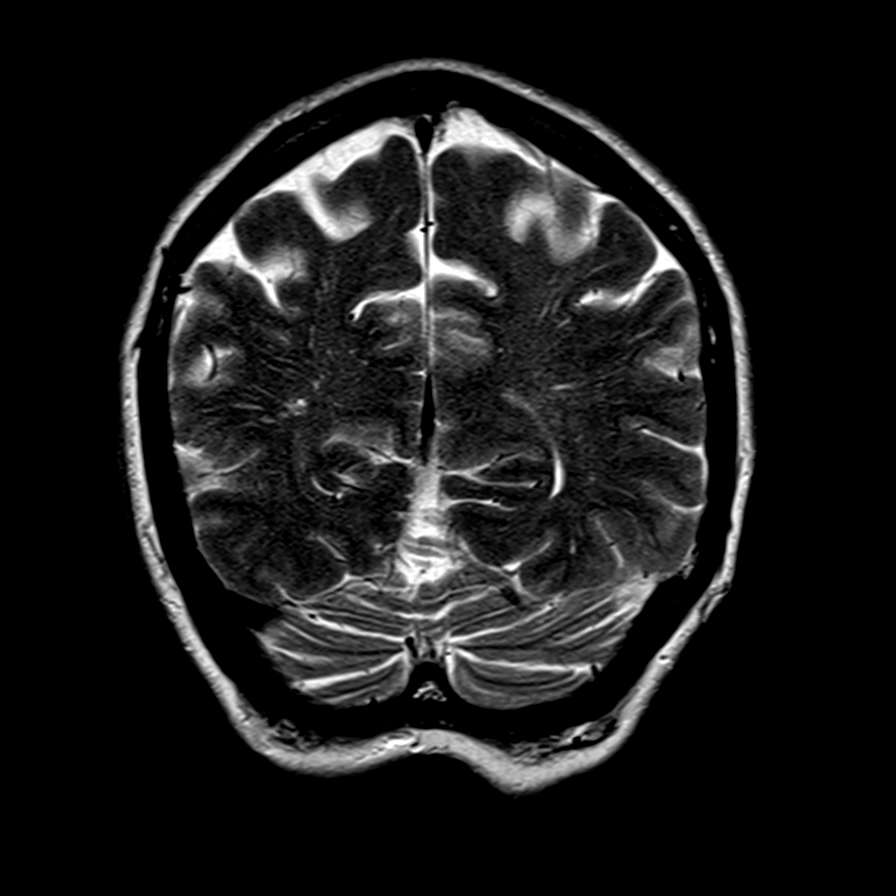

[15 of 48 positions shown; findings below may reference images not displayed]

FINDINGS: The diffusion-weighted images demonstrate no evidence for acute or
subacute infarction. Scattered periventricular and subcortical T2
hyperintensities bilaterally are similar to the prior exam. No acute
infarct, hemorrhage, or mass lesion is present. The ventricles are
of normal size. No significant extraaxial fluid collection is
present.

Flow is present in the major intracranial arteries. A fluid level is
present in the left sphenoid sinus. There is minimal fluid in the
left maxillary sinus. The remaining paranasal sinuses and the
mastoid air cells are clear. Bilateral lens replacements are noted.
IMPRESSION: 1. Stable bilateral periventricular and subcortical T2
hyperintensities bilaterally. These are nonspecific, but likely
reflects the sequelae of chronic microvascular ischemia.
2. No acute intracranial abnormality or significant interval change.
3. Minimal left sphenoid and maxillary sinus disease.

## 2017-03-10 ENCOUNTER — Ambulatory Visit (INDEPENDENT_AMBULATORY_CARE_PROVIDER_SITE_OTHER): Payer: PPO | Admitting: Family Medicine

## 2017-03-10 ENCOUNTER — Encounter: Payer: Self-pay | Admitting: Family Medicine

## 2017-03-10 VITALS — BP 120/64 | Temp 98.8°F | Ht 63.5 in | Wt 135.0 lb

## 2017-03-10 DIAGNOSIS — J329 Chronic sinusitis, unspecified: Secondary | ICD-10-CM

## 2017-03-10 MED ORDER — CEFDINIR 300 MG PO CAPS: 300.0000 mg | ORAL_CAPSULE | Freq: Two times a day (BID) | ORAL | 0 refills | Status: DC

## 2017-03-10 NOTE — Progress Notes (Signed)
   Subjective:    Patient ID: Yvonne Maynard, female    DOB: 1940/03/15, 77 y.o.   MRN: [id number]  HPI  Patient is here today with complaints of sinus drainage and fever,productive cough producing yellow/green mucus ongoing since Sunday. She has been taking sudafed which has not helped much.   drabg e anc ba d coughing   Using both benadryl and sudafed    , Worse with cough, worse with position, frontal headache etles   Running a little bit  f fever  Using sudafed prn      Review of Systems No headache, no major weight loss or weight gain, no chest pain no back pain abdominal pain no change in bowel habits complete ROS otherwise negative     Objective:   Physical Exam   Alert, mild malaise. Hydration good Vitals stable. frontal/ maxillary tenderness evident positive nasal congestion. pharynx normal neck supple  lungs clear/no crackles or wheezes. heart regular in rhythm      Assessment & Plan:  Impression rhinosinusitis likely post viral, discussed with patient. plan antibiotics prescribed. Questions answered. Symptomatic care discussed. warning signs discussed. WSL

## 2017-05-02 DIAGNOSIS — D225 Melanocytic nevi of trunk: Secondary | ICD-10-CM | POA: Diagnosis not present

## 2017-05-02 DIAGNOSIS — D1801 Hemangioma of skin and subcutaneous tissue: Secondary | ICD-10-CM | POA: Diagnosis not present

## 2017-05-02 DIAGNOSIS — L570 Actinic keratosis: Secondary | ICD-10-CM | POA: Diagnosis not present

## 2017-05-02 DIAGNOSIS — L814 Other melanin hyperpigmentation: Secondary | ICD-10-CM | POA: Diagnosis not present

## 2017-05-02 DIAGNOSIS — Z86018 Personal history of other benign neoplasm: Secondary | ICD-10-CM | POA: Diagnosis not present

## 2017-05-02 DIAGNOSIS — Z85828 Personal history of other malignant neoplasm of skin: Secondary | ICD-10-CM | POA: Diagnosis not present

## 2017-05-02 DIAGNOSIS — L821 Other seborrheic keratosis: Secondary | ICD-10-CM | POA: Diagnosis not present

## 2017-05-03 DIAGNOSIS — Z1231 Encounter for screening mammogram for malignant neoplasm of breast: Secondary | ICD-10-CM | POA: Diagnosis not present

## 2017-06-14 ENCOUNTER — Other Ambulatory Visit: Payer: Self-pay | Admitting: Nurse Practitioner

## 2017-11-17 ENCOUNTER — Other Ambulatory Visit: Payer: Self-pay | Admitting: Family Medicine

## 2017-11-17 ENCOUNTER — Telehealth: Payer: Self-pay | Admitting: *Deleted

## 2017-11-17 DIAGNOSIS — Z79899 Other long term (current) drug therapy: Secondary | ICD-10-CM

## 2017-11-17 DIAGNOSIS — Z13220 Encounter for screening for lipoid disorders: Secondary | ICD-10-CM

## 2017-11-17 DIAGNOSIS — E876 Hypokalemia: Secondary | ICD-10-CM

## 2017-11-17 NOTE — Telephone Encounter (Signed)
Patient is aware.Orders placed.

## 2017-11-17 NOTE — Telephone Encounter (Signed)
Has a physical in December. Would like to know if she needs bw before appt. Last labs 12/2016. Lipid, liver, bmp, a1c

## 2017-11-17 NOTE — Telephone Encounter (Signed)
Lipid, CBC, metabolic 7 I would not recommend an A1c unless her fasting glucose is significantly elevated

## 2017-12-18 DIAGNOSIS — E876 Hypokalemia: Secondary | ICD-10-CM | POA: Diagnosis not present

## 2017-12-18 DIAGNOSIS — Z79899 Other long term (current) drug therapy: Secondary | ICD-10-CM | POA: Diagnosis not present

## 2017-12-18 DIAGNOSIS — Z13220 Encounter for screening for lipoid disorders: Secondary | ICD-10-CM | POA: Diagnosis not present

## 2017-12-19 LAB — BASIC METABOLIC PANEL
BUN/Creatinine Ratio: 14 (ref 12–28)
BUN: 11 mg/dL (ref 8–27)
CHLORIDE: 105 mmol/L (ref 96–106)
CO2: 24 mmol/L (ref 20–29)
Calcium: 9.6 mg/dL (ref 8.7–10.3)
Creatinine, Ser: 0.78 mg/dL (ref 0.57–1.00)
GFR calc Af Amer: 85 mL/min/{1.73_m2} (ref >59)
GFR, EST NON AFRICAN AMERICAN: 74 mL/min/{1.73_m2} (ref >59)
GLUCOSE: 95 mg/dL (ref 65–99)
POTASSIUM: 5 mmol/L (ref 3.5–5.2)
SODIUM: 145 mmol/L — AB (ref 134–144)

## 2017-12-19 LAB — CBC WITH DIFFERENTIAL/PLATELET
BASOS ABS: 0.1 10*3/uL (ref 0.0–0.2)
Basos: 2 %
EOS (ABSOLUTE): 0.3 10*3/uL (ref 0.0–0.4)
Eos: 4 %
HEMOGLOBIN: 15.4 g/dL (ref 11.1–15.9)
Hematocrit: 45 % (ref 34.0–46.6)
IMMATURE GRANS (ABS): 0 10*3/uL (ref 0.0–0.1)
Immature Granulocytes: 0 %
LYMPHS: 28 %
Lymphocytes Absolute: 1.8 10*3/uL (ref 0.7–3.1)
MCH: 30.6 pg (ref 26.6–33.0)
MCHC: 34.2 g/dL (ref 31.5–35.7)
MCV: 89 fL (ref 79–97)
MONOCYTES: 7 %
Monocytes Absolute: 0.5 10*3/uL (ref 0.1–0.9)
NEUTROS ABS: 3.8 10*3/uL (ref 1.4–7.0)
Neutrophils: 59 %
Platelets: 557 10*3/uL — ABNORMAL HIGH (ref 150–450)
RBC: 5.04 x10E6/uL (ref 3.77–5.28)
RDW: 13.5 % (ref 12.3–15.4)
WBC: 6.5 10*3/uL (ref 3.4–10.8)

## 2017-12-19 LAB — LIPID PANEL
CHOL/HDL RATIO: 3.9 ratio (ref 0.0–4.4)
CHOLESTEROL TOTAL: 183 mg/dL (ref 100–199)
HDL: 47 mg/dL (ref >39)
LDL Calculated: 109 mg/dL — ABNORMAL HIGH (ref 0–99)
TRIGLYCERIDES: 133 mg/dL (ref 0–149)
VLDL Cholesterol Cal: 27 mg/dL (ref 5–40)

## 2018-01-01 ENCOUNTER — Telehealth: Payer: Self-pay | Admitting: Family Medicine

## 2018-01-01 NOTE — Telephone Encounter (Signed)
Contacted patient. Pt declined appointment for tomorrow morning. Pt states that she is feeling better than we see got up today. Pt states she did go to drug store and get OTC throat drops and something for drainage. Pt states that she thinks she will be OK until Friday. Pt advised that if she got worse and wanted to come in to give [place] a call. Pt verbalized understanding. Pt is not having trouble swallowing or drooling or any other severe symptoms.

## 2018-01-01 NOTE — Telephone Encounter (Signed)
Please advise. Thank you

## 2018-01-01 NOTE — Telephone Encounter (Signed)
Pt has been having sore throat since Saturday morning and low grade fever. Her temp this morning is 100 and her throat is very sore. We are completely booked today. She is hoping the nurse can make a suggestion of something she can take she has been gargling salt water and taking lemon and honey. Also taking ibuprofen for fever. She has an appt on Friday for physical. CB# [phone].

## 2018-01-01 NOTE — Telephone Encounter (Signed)
Doing what she is doing is reasonable. Nurses please talk with the patient I can see her tomorrow morning She can be given an appointment Please let me know if she is having severe symptoms such as drooling and unable to swallow (if so please talk with you while she is on the phone)

## 2018-01-05 ENCOUNTER — Ambulatory Visit (INDEPENDENT_AMBULATORY_CARE_PROVIDER_SITE_OTHER): Payer: PPO | Admitting: Family Medicine

## 2018-01-05 ENCOUNTER — Encounter: Payer: Self-pay | Admitting: Family Medicine

## 2018-01-05 VITALS — BP 132/82 | Ht 62.0 in | Wt 127.8 lb

## 2018-01-05 DIAGNOSIS — N760 Acute vaginitis: Secondary | ICD-10-CM

## 2018-01-05 DIAGNOSIS — Z0000 Encounter for general adult medical examination without abnormal findings: Secondary | ICD-10-CM

## 2018-01-05 DIAGNOSIS — M21611 Bunion of right foot: Secondary | ICD-10-CM | POA: Diagnosis not present

## 2018-01-05 DIAGNOSIS — B9689 Other specified bacterial agents as the cause of diseases classified elsewhere: Secondary | ICD-10-CM

## 2018-01-05 DIAGNOSIS — Z01411 Encounter for gynecological examination (general) (routine) with abnormal findings: Secondary | ICD-10-CM

## 2018-01-05 MED ORDER — METRONIDAZOLE 500 MG PO TABS: 500.0000 mg | ORAL_TABLET | Freq: Two times a day (BID) | ORAL | 0 refills | Status: AC

## 2018-01-05 NOTE — Progress Notes (Signed)
Subjective:    Patient ID: Yvonne Maynard, female    DOB: 26-Jan-1940, 77 y.o.   MRN: [id number]  HPI The patient comes in today for a wellness visit.  A review of their health history was completed. A review of medications was also completed.  Any needed refills; not at this time  Eating habits: health conscious  Falls/  MVA accidents in past few months: no  Regular exercise: stays on the move all day  Specialist pt sees on regular basis: dermatologist every year  Preventative health issues were discussed. DEXA scan due next year. Mammogram- pt reports done in 2019 we do not have this record, pt schedules these herself. Colonoscopy done in her 53s, was told no more f/u needed. Pap smear done over 10 years ago, last was normal told no more f/u needed.  Additional concerns: Pt has had some sore throat and drainage x 7 days. Pt has been taking OTC allergy pills recommended by pharmacist. Pt takes one every 4 hours. Reports sore throat has improved.  Reports hx of arthritis in right foot that has been bothering her at night. Right big toe is painful, worse at night. Sometimes takes advil or aleve - denies helpful.   Last couple of weeks has had some vaginal discharge and is having to wear a pad. Not a whole lot but is having to wear a pad. Pt has itching also. Reports discharge is yellow in color. Denies sexual activity. Denies recent abx use.   Pt also had has labwork completed.    Review of Systems  Constitutional: Negative for chills, fatigue, fever and unexpected weight change.  HENT: Negative for congestion, ear pain, sinus pressure and sinus pain.   Eyes: Negative for discharge and visual disturbance.  Respiratory: Negative for cough, shortness of breath and wheezing.   Cardiovascular: Negative for chest pain and leg swelling.  Gastrointestinal: Negative for abdominal pain, blood in stool, constipation, diarrhea, nausea and vomiting.  Genitourinary: Positive for vaginal  discharge. Negative for difficulty urinating, hematuria, pelvic pain and vaginal bleeding.  Musculoskeletal: Positive for arthralgias (Right big toe pain, see HPI).  Neurological: Negative for dizziness, weakness, light-headedness and headaches.  Psychiatric/Behavioral: Negative for suicidal ideas.  All other systems reviewed and are negative.      Objective:   Physical Exam Vitals signs and nursing note reviewed. Exam conducted with a chaperone present.  Constitutional:      General: She is not in acute distress.    Appearance: She is well-developed.  HENT:     Head: Normocephalic and atraumatic.     Right Ear: Tympanic membrane normal.     Left Ear: Tympanic membrane normal.     Nose: Nose normal.     Mouth/Throat:     Pharynx: Uvula midline.  Eyes:     General:        Right eye: No discharge.        Left eye: No discharge.     Conjunctiva/sclera: Conjunctivae normal.     Pupils: Pupils are equal, round, and reactive to light.  Neck:     Musculoskeletal: Neck supple.     Thyroid: No thyromegaly.  Cardiovascular:     Rate and Rhythm: Normal rate and regular rhythm.     Heart sounds: Normal heart sounds. No murmur.  Pulmonary:     Effort: Pulmonary effort is normal. No respiratory distress.     Breath sounds: Normal breath sounds. No wheezing.  Chest:     Breasts: Breasts  are symmetrical.        Right: Normal. No inverted nipple, mass, nipple discharge, skin change or tenderness.        Left: Normal. No inverted nipple, mass, nipple discharge, skin change or tenderness.  Abdominal:     General: Bowel sounds are normal. There is no distension.     Palpations: Abdomen is soft. There is no mass.     Tenderness: There is no abdominal tenderness.  Genitourinary:    General: Normal vulva.     Labia:        Right: No rash, tenderness or lesion.        Left: No rash, tenderness or lesion.      Vagina: Vaginal discharge (small amount of thin yellow discharge) and erythema  present. No tenderness.     Cervix: Erythema present. No cervical motion tenderness.     Uterus: Normal.      Adnexa: Right adnexa normal and left adnexa normal.       Right: No mass or tenderness.         Left: No mass or tenderness.    Musculoskeletal:        General: No deformity.  Feet:     Comments: Right first MTP joint with tenderness, possible mild bunion, no erythema or warmth noted. Lymphadenopathy:     Cervical: No cervical adenopathy.     Upper Body:     Right upper body: No supraclavicular or axillary adenopathy.     Left upper body: No supraclavicular or axillary adenopathy.  Skin:    General: Skin is warm and dry.  Neurological:     Mental Status: She is alert and oriented to person, place, and time.     Coordination: Coordination normal.  Psychiatric:        Mood and Affect: Mood and affect normal.    Wet prep: WBCs too numerous to count per hpf, rare epithelial cell, no RBCs. KOH: positive whiff test, pH 6.0, no evidence of yeast  Results for orders placed or performed in visit on 01/25/30  Basic metabolic panel  Result Value Ref Range   Glucose 95 65 - 99 mg/dL   BUN 11 8 - 27 mg/dL   Creatinine, Ser 0.78 0.57 - 1.00 mg/dL   GFR calc non Af Amer 74 >59 mL/min/1.73   GFR calc Af Amer 85 >59 mL/min/1.73   BUN/Creatinine Ratio 14 12 - 28   Sodium 145 (H) 134 - 144 mmol/L   Potassium 5.0 3.5 - 5.2 mmol/L   Chloride 105 96 - 106 mmol/L   CO2 24 20 - 29 mmol/L   Calcium 9.6 8.7 - 10.3 mg/dL  Lipid panel  Result Value Ref Range   Cholesterol, Total 183 100 - 199 mg/dL   Triglycerides 133 0 - 149 mg/dL   HDL 47 >39 mg/dL   VLDL Cholesterol Cal 27 5 - 40 mg/dL   LDL Calculated 109 (H) 0 - 99 mg/dL   Chol/HDL Ratio 3.9 0.0 - 4.4 ratio  CBC with Differential/Platelet  Result Value Ref Range   WBC 6.5 3.4 - 10.8 x10E3/uL   RBC 5.04 3.77 - 5.28 x10E6/uL   Hemoglobin 15.4 11.1 - 15.9 g/dL   Hematocrit 45.0 34.0 - 46.6 %   MCV 89 79 - 97 fL   MCH 30.6 26.6 -  33.0 pg   MCHC 34.2 31.5 - 35.7 g/dL   RDW 13.5 12.3 - 15.4 %   Platelets 557 (H) 150 - 450 x10E3/uL  Neutrophils 59 Not Estab. %   Lymphs 28 Not Estab. %   Monocytes 7 Not Estab. %   Eos 4 Not Estab. %   Basos 2 Not Estab. %   Neutrophils Absolute 3.8 1.4 - 7.0 x10E3/uL   Lymphocytes Absolute 1.8 0.7 - 3.1 x10E3/uL   Monocytes Absolute 0.5 0.1 - 0.9 x10E3/uL   EOS (ABSOLUTE) 0.3 0.0 - 0.4 x10E3/uL   Basophils Absolute 0.1 0.0 - 0.2 x10E3/uL   Immature Granulocytes 0 Not Estab. %   Immature Grans (Abs) 0.0 0.0 - 0.1 x10E3/uL        Assessment & Plan:  1. Encounter for well woman exam with abnormal findings Adult wellness-complete.wellness physical was conducted today. Importance of diet and exercise were discussed in detail.  In addition to this a discussion regarding safety was also covered. We also reviewed over immunizations and gave recommendations regarding current immunization needed for age.  In addition to this additional areas were also touched on including: Preventative health exams needed:  Colonoscopy: no longer needed Mammogram due, pt will schedule Pap smear: no longer needed  Patient was advised yearly wellness exam. Recent lab work reviewed with patient.   2. Bacterial vaginosis Likely BV, will treat with flagyl. Warned against alcohol consumption with medication. F/u if symptoms worsen or fail to improve.  3. Bunion of great toe of right foot Likely mild bunion, possible element of arthritis involvement as well. Recommend referral to podiatry. Pt will try and schedule and if referral is needed she will notify us.  Dr. Mickie Hillier was consulted on this case and is in agreement with the above treatment plan.

## 2018-01-05 NOTE — Patient Instructions (Signed)
Bacterial Vaginosis    Bacterial vaginosis is a vaginal infection that occurs when the normal balance of bacteria in the vagina is disrupted. It results from an overgrowth of certain bacteria. This is the most common vaginal infection among women ages 15-44.  Because bacterial vaginosis increases your risk for STIs (sexually transmitted infections), getting treated can help reduce your risk for chlamydia, gonorrhea, herpes, and HIV (human immunodeficiency virus). Treatment is also important for preventing complications in pregnant women, because this condition can cause an early (premature) delivery.  What are the causes?  This condition is caused by an increase in harmful bacteria that are normally present in small amounts in the vagina. However, the reason that the condition develops is not fully understood.  What increases the risk?  The following factors may make you more likely to develop this condition:  · Having a new sexual partner or multiple sexual partners.  · Having unprotected sex.  · Douching.  · Having an intrauterine device (IUD).  · Smoking.  · Drug and alcohol abuse.  · Taking certain antibiotic medicines.  · Being pregnant.  You cannot get bacterial vaginosis from toilet seats, bedding, swimming pools, or contact with objects around you.  What are the signs or symptoms?  Symptoms of this condition include:  · Grey or white vaginal discharge. The discharge can also be watery or foamy.  · A fish-like odor with discharge, especially after sexual intercourse or during menstruation.  · Itching in and around the vagina.  · Burning or pain with urination.  Some women with bacterial vaginosis have no signs or symptoms.  How is this diagnosed?  This condition is diagnosed based on:  · Your medical history.  · A physical exam of the vagina.  · Testing a sample of vaginal fluid under a microscope to look for a large amount of bad bacteria or abnormal cells. Your health care provider may use a cotton swab or  a small wooden spatula to collect the sample.  How is this treated?  This condition is treated with antibiotics. These may be given as a pill, a vaginal cream, or a medicine that is put into the vagina (suppository). If the condition comes back after treatment, a second round of antibiotics may be needed.  Follow these instructions at home:  Medicines  · Take over-the-counter and prescription medicines only as told by your health care provider.  · Take or use your antibiotic as told by your health care provider. Do not stop taking or using the antibiotic even if you start to feel better.  General instructions  · If you have a female sexual partner, tell her that you have a vaginal infection. She should see her health care provider and be treated if she has symptoms. If you have a female sexual partner, he does not need treatment.  · During treatment:  ? Avoid sexual activity until you finish treatment.  ? Do not douche.  ? Avoid alcohol as directed by your health care provider.  ? Avoid breastfeeding as directed by your health care provider.  · Drink enough water and fluids to keep your urine clear or pale yellow.  · Keep the area around your vagina and rectum clean.  ? Wash the area daily with warm water.  ? Wipe yourself from front to back after using the toilet.  · Keep all follow-up visits as told by your health care provider. This is important.  How is this prevented?  · Do not   douche.  · Wash the outside of your vagina with warm water only.  · Use protection when having sex. This includes latex condoms and dental dams.  · Limit how many sexual partners you have. To help prevent bacterial vaginosis, it is best to have sex with just one partner (monogamous).  · Make sure you and your sexual partner are tested for STIs.  · Wear cotton or cotton-lined underwear.  · Avoid wearing tight pants and pantyhose, especially during summer.  · Limit the amount of alcohol that you drink.  · Do not use any products that contain  nicotine or tobacco, such as cigarettes and e-cigarettes. If you need help quitting, ask your health care provider.  · Do not use illegal drugs.  Where to find more information  · Centers for Disease Control and Prevention: www.cdc.gov/std  · American Sexual Health Association (ASHA): www.ashastd.org  · U.S. Department of Health and Human Services, Office on Women's Health: www.womenshealth.gov/ or https://www.womenshealth.gov/a-z-topics/bacterial-vaginosis  Contact a health care provider if:  · Your symptoms do not improve, even after treatment.  · You have more discharge or pain when urinating.  · You have a fever.  · You have pain in your abdomen.  · You have pain during sex.  · You have vaginal bleeding between periods.  Summary  · Bacterial vaginosis is a vaginal infection that occurs when the normal balance of bacteria in the vagina is disrupted.  · Because bacterial vaginosis increases your risk for STIs (sexually transmitted infections), getting treated can help reduce your risk for chlamydia, gonorrhea, herpes, and HIV (human immunodeficiency virus). Treatment is also important for preventing complications in pregnant women, because the condition can cause an early (premature) delivery.  · This condition is treated with antibiotic medicines. These may be given as a pill, a vaginal cream, or a medicine that is put into the vagina (suppository).  This information is not intended to replace advice given to you by your health care provider. Make sure you discuss any questions you have with your health care provider.  Document Released: 01/03/2005 Document Revised: 05/09/2016 Document Reviewed: 09/19/2015  Elsevier Interactive Patient Education © 2019 [employer]

## 2018-01-15 ENCOUNTER — Telehealth: Payer: Self-pay | Admitting: Family Medicine

## 2018-01-15 MED ORDER — FLUCONAZOLE 150 MG PO TABS: ORAL_TABLET | ORAL | 0 refills | Status: DC

## 2018-01-15 NOTE — Telephone Encounter (Signed)
Medication sent to [facility].Tried calling pt no answer.

## 2018-01-15 NOTE — Telephone Encounter (Signed)
Pt finished medication of metroNIDAZOLE (FLAGYL) 500 MG tablet and symptoms are no better, vag discharge, vag and buttocks itching, pt would like another medication called in. Advise.   Ph[facility]:  [facility], [place]

## 2018-01-15 NOTE — Telephone Encounter (Signed)
Please advise 

## 2018-01-15 NOTE — Telephone Encounter (Signed)
Ria Comment to see please (she saw her recently for this issue)

## 2018-01-15 NOTE — Telephone Encounter (Signed)
May try diflucan 150 mg po, 1 tablet today and repeat dose in 3 days. #2, R-0. If symptoms persist she needs to f/u for repeat examination. Thanks!

## 2018-01-16 NOTE — Telephone Encounter (Signed)
Patient notified and verbalized understanding. 

## 2018-01-20 ENCOUNTER — Other Ambulatory Visit: Payer: Self-pay | Admitting: Family Medicine

## 2018-02-13 ENCOUNTER — Other Ambulatory Visit: Payer: Self-pay | Admitting: Family Medicine

## 2018-03-22 ENCOUNTER — Other Ambulatory Visit: Payer: Self-pay | Admitting: Family Medicine

## 2018-06-25 DIAGNOSIS — M79674 Pain in right toe(s): Secondary | ICD-10-CM | POA: Diagnosis not present

## 2018-06-25 DIAGNOSIS — M19071 Primary osteoarthritis, right ankle and foot: Secondary | ICD-10-CM | POA: Diagnosis not present

## 2018-06-25 DIAGNOSIS — M205X1 Other deformities of toe(s) (acquired), right foot: Secondary | ICD-10-CM | POA: Diagnosis not present

## 2018-08-01 ENCOUNTER — Encounter: Payer: Self-pay | Admitting: Family Medicine

## 2018-08-01 DIAGNOSIS — R922 Inconclusive mammogram: Secondary | ICD-10-CM | POA: Diagnosis not present

## 2018-08-01 DIAGNOSIS — M8589 Other specified disorders of bone density and structure, multiple sites: Secondary | ICD-10-CM | POA: Diagnosis not present

## 2018-08-01 DIAGNOSIS — Z803 Family history of malignant neoplasm of breast: Secondary | ICD-10-CM | POA: Diagnosis not present

## 2018-08-01 DIAGNOSIS — Z8262 Family history of osteoporosis: Secondary | ICD-10-CM | POA: Diagnosis not present

## 2018-08-01 DIAGNOSIS — R29890 Loss of height: Secondary | ICD-10-CM | POA: Diagnosis not present

## 2018-08-01 DIAGNOSIS — Z1231 Encounter for screening mammogram for malignant neoplasm of breast: Secondary | ICD-10-CM | POA: Diagnosis not present

## 2018-08-01 DIAGNOSIS — N6311 Unspecified lump in the right breast, upper outer quadrant: Secondary | ICD-10-CM | POA: Diagnosis not present

## 2018-08-08 ENCOUNTER — Other Ambulatory Visit: Payer: Self-pay | Admitting: Radiology

## 2018-08-08 DIAGNOSIS — D0511 Intraductal carcinoma in situ of right breast: Secondary | ICD-10-CM | POA: Diagnosis not present

## 2018-08-08 DIAGNOSIS — N6311 Unspecified lump in the right breast, upper outer quadrant: Secondary | ICD-10-CM | POA: Diagnosis not present

## 2018-08-13 ENCOUNTER — Encounter: Payer: Self-pay | Admitting: Genetic Counselor

## 2018-08-15 ENCOUNTER — Encounter: Payer: Self-pay | Admitting: Adult Health

## 2018-08-15 DIAGNOSIS — C50411 Malignant neoplasm of upper-outer quadrant of right female breast: Secondary | ICD-10-CM | POA: Insufficient documentation

## 2018-08-16 ENCOUNTER — Other Ambulatory Visit (HOSPITAL_COMMUNITY): Payer: Self-pay | Admitting: Surgery

## 2018-08-16 DIAGNOSIS — D0591 Unspecified type of carcinoma in situ of right breast: Secondary | ICD-10-CM | POA: Diagnosis not present

## 2018-08-17 ENCOUNTER — Other Ambulatory Visit: Payer: Self-pay

## 2018-08-20 NOTE — Progress Notes (Signed)
Location of Breast Cancer: Right Breast  Histology per Pathology Report:  08/08/18 Diagnosis Breast, right, needle core biopsy, 11 o'clock, 4cmfn - DUCTAL CARCINOMA IN SITU.  Per pathology report there was insufficient tumor present for ER and PR receptive studies.   Did patient present with symptoms or was this found on screening mammography?: It was found on a screening mammogram.   Past/Anticipated interventions by surgeon, if any: 08/16/18 Dr. Lucia Gaskins consult. He referred her to radiation and medical oncology.  Surgery is scheduled for 09/21/18   Past/Anticipated interventions by medical oncology, if any: 09/04/18 Dr. Lindi Adie  Lymphedema issues, if any:  N/A  Pain issues, if any:  She denies.   SAFETY ISSUES:  Prior radiation? No  Pacemaker/ICD? No  Possible current pregnancy? No  Is the patient on methotrexate? No  Current Complaints / other details:       Raeford Brandenburg, Stephani Police, RN 08/20/2018,2:15 PM

## 2018-08-24 ENCOUNTER — Telehealth: Payer: Self-pay | Admitting: Family Medicine

## 2018-08-24 NOTE — Telephone Encounter (Signed)
Patient just scratch her leg on some farm equipment. Said it isn't bad enough to go to urgent care but is wondering if she should go to the pharmacy and get a tetnus shot this weekend?

## 2018-08-24 NOTE — Telephone Encounter (Signed)
Patient states she cut her leg on a piece of far equipment and wonders if she needs a TD. Patient states she had a rash reaction to a TD 40-50 years ago but she had a injury 10 or more years ago and she received 1/2 of a TD and had no reaction and wants to know if she should get one (patietn would need to get at pharmacy due to insurance)  Forward to Dr Nicki Reaper per Dr Richardson Landry and patient is aware she will get call Monday with recommendations.

## 2018-08-26 NOTE — Telephone Encounter (Signed)
So it is unlikely that she would have a reaction to the vaccine it is possible  The rash she had 40 or 50 years ago was not hives or life-threatening?  Or was it just a localized rash where the shot was? More details would be helpful

## 2018-08-27 ENCOUNTER — Telehealth: Payer: Self-pay | Admitting: Hematology and Oncology

## 2018-08-27 NOTE — Telephone Encounter (Signed)
Pt returned call. Pt states that when she had the last Tetnus shot she broke out in hives only under her clothes. Pt state she is not sure if the Tetnus shot caused it or the pain med that was prescribed at that time. Pt states she has not had a whole tetnus shot since then. Pt state she hit her leg on the point of the plow on Thursday and it did bleed quite a bit; has been putting neosporin and alcohol on the area. No fever, no n/v/d, no other symptoms. Pt states the area is red and sore just a little. Can leave detailed message on voicemail if pt does not answer. Please advise. Thank you

## 2018-08-27 NOTE — Telephone Encounter (Signed)
Received a new referral from Dr. Lucia Gaskins at [facility] for breast cancer. Yvonne Maynard has been cld and scheduled to see Dr. Lindi Adie on 8/18 at 1pm. Yvonne Maynard has been made aware to arrive 15 minutes early.

## 2018-08-27 NOTE — Telephone Encounter (Signed)
Left message to return call 

## 2018-08-28 ENCOUNTER — Other Ambulatory Visit: Payer: Self-pay

## 2018-08-28 ENCOUNTER — Telehealth: Payer: Self-pay | Admitting: Radiation Oncology

## 2018-08-28 ENCOUNTER — Encounter: Payer: Self-pay | Admitting: Family Medicine

## 2018-08-28 ENCOUNTER — Telehealth: Payer: Self-pay | Admitting: Family Medicine

## 2018-08-28 ENCOUNTER — Ambulatory Visit (INDEPENDENT_AMBULATORY_CARE_PROVIDER_SITE_OTHER): Payer: PPO | Admitting: Family Medicine

## 2018-08-28 VITALS — BP 130/74 | Temp 97.7°F | Ht 62.0 in | Wt 133.0 lb

## 2018-08-28 DIAGNOSIS — W458XXA Other foreign body or object entering through skin, initial encounter: Secondary | ICD-10-CM

## 2018-08-28 DIAGNOSIS — S81831A Puncture wound without foreign body, right lower leg, initial encounter: Secondary | ICD-10-CM

## 2018-08-28 DIAGNOSIS — Z23 Encounter for immunization: Secondary | ICD-10-CM

## 2018-08-28 DIAGNOSIS — M79661 Pain in right lower leg: Secondary | ICD-10-CM

## 2018-08-28 DIAGNOSIS — T148XXA Other injury of unspecified body region, initial encounter: Secondary | ICD-10-CM

## 2018-08-28 NOTE — Telephone Encounter (Signed)
Please review note from 8/7 patient wants to get tenus shot today if possible the injury happen last week.

## 2018-08-28 NOTE — Telephone Encounter (Signed)
Please advise. Thank you

## 2018-08-28 NOTE — Telephone Encounter (Signed)
Patient has appointment today

## 2018-08-28 NOTE — Progress Notes (Signed)
   Subjective:    Patient ID: Yvonne Maynard, female    DOB: September 28, 1940, 78 y.o.   MRN: [id number]  HPIpoked right lower leg with a piece of rusty metal 5 days ago.  Pt wanted to get tetanus vaccine. She had a reaction to tetanus about 40 years ago. Broke out in hives. Pt states since then she did a half of a tetanus vaccine and did fine with it. Pt has been cleaning area with neosporin and alcholol.   Whole dose in the pzst cused a problem  Review of Systems No headache, no major weight loss or weight gain, no chest pain no back pain abdominal pain no change in bowel habits complete ROS otherwise negative     Objective:   Physical Exam  Alert vitals stable, NAD. Blood pressure good on repeat. HEENT normal. Lungs clear. Heart regular rate and rhythm. Puncture wound site ulceration at site.  No frank cellulitis tissue surrounding within normal limits      Assessment & Plan:  Impression puncture wound.  Patient states that curiously she can only handle one half a tetanus shot.  Not enough infection present to warrant oral antibiotics.  Local measures discussed warning signs discussed  Greater than 50% of this 15 minute face to face visit was spent in counseling and discussion and coordination of care regarding the above diagnosis/diagnosies

## 2018-08-28 NOTE — Telephone Encounter (Signed)
Although this is somewhat of a confusing message it seems that the patient has been able to take a half dose of the tetanus without reaction if that is the case it is unlikely a whole dose would cause reaction either  I would recommend the patient go ahead with a tetanus shot half dose.  But it also seems that likely the patient could tolerate a full dose

## 2018-08-28 NOTE — Telephone Encounter (Signed)
Confirmed appt and verified info. °

## 2018-08-28 NOTE — Telephone Encounter (Signed)
Has appt today. Will discuss then

## 2018-08-29 ENCOUNTER — Other Ambulatory Visit: Payer: Self-pay

## 2018-08-29 ENCOUNTER — Ambulatory Visit
Admission: RE | Admit: 2018-08-29 | Discharge: 2018-08-29 | Disposition: A | Discharge status: Home / Self Care | Payer: PPO | Source: Ambulatory Visit | Attending: Radiation Oncology | Admitting: Radiation Oncology

## 2018-08-29 ENCOUNTER — Encounter: Payer: Self-pay | Admitting: Radiation Oncology

## 2018-08-29 DIAGNOSIS — Z800 Family history of malignant neoplasm of digestive organs: Secondary | ICD-10-CM | POA: Diagnosis not present

## 2018-08-29 DIAGNOSIS — C50411 Malignant neoplasm of upper-outer quadrant of right female breast: Secondary | ICD-10-CM

## 2018-08-29 DIAGNOSIS — D0511 Intraductal carcinoma in situ of right breast: Secondary | ICD-10-CM

## 2018-08-29 NOTE — Progress Notes (Signed)
Radiation Oncology         [phone] ________________________________  Initial outpatient telephone consultation -due to pandemic risks.  Patient unable to access WebEx  Name: Yvonne Maynard MRN: [id number]  Date: 08/29/2018  DOB: 05/31/1940  [id number], Elayne Snare, MD  No ref. provider found   REFERRING PHYSICIAN: Alphonsa Overall MD  DIAGNOSIS: Stage 0 DCIS, TisN0M0, right breast Cancer Staging Malignant neoplasm of upper-outer quadrant of right female breast [facility]) Staging form: Breast, AJCC 8th Edition - Clinical stage from 08/08/2018: Stage 0 (cTis (DCIS), cN0, cM0) - Unsigned   CHIEF COMPLAINT: Here to discuss management of DCIS  HISTORY OF PRESENT ILLNESS::Yvonne Maynard is a 78 y.o. female who presented with breast abnormality on the following imaging: screening mammogram, which revealed a 6 mm oval mass in the right breast.  Ultrasound revealed an 8 mm oval mass in the right breast at the 11 o'clock position 4 cm from the nipple.  Symptoms, if any, at that time, were:  none.  Biopsy of the breast mass revealed DCIS, not enough tissue to determine estrogen and progesterone receptor status.  The DCIS is high grade.  Lumpectomy is pending for early September.  PREVIOUS RADIATION THERAPY: No  PAST MEDICAL HISTORY:  has a past medical history of Asthma, Cerebrovascular disease, Diverticulosis of colon (08/19/2010), DJD (degenerative joint disease) of cervical spine, Generalized convulsive seizures ([place]) (30 YEARS AGO), Osteopenia, and Vertigo (08/2010).    PAST SURGICAL HISTORY: Past Surgical History:  Procedure Laterality Date  . COLONOSCOPY    . TONSILLECTOMY    . TUBAL LIGATION    . TUBAL LIGATION      FAMILY HISTORY: family history includes Colon cancer (age of onset: 43) in her maternal uncle; Crohn's disease in her mother.  SOCIAL HISTORY:  reports that she has never smoked. She has never used smokeless tobacco. She reports that she does not drink alcohol or use drugs.   ALLERGIES: Tetanus toxoids  MEDICATIONS:  Current Outpatient Medications  Medication Sig Dispense Refill  . albuterol (PROVENTIL HFA;VENTOLIN HFA) 108 (90 BASE) MCG/ACT inhaler Inhale 2 puffs into the lungs 2 (two) times daily as needed for wheezing or shortness of breath. 1 Inhaler 0  . aspirin EC 81 MG tablet Take 81 mg by mouth daily.      . Azelastine-Fluticasone (DYMISTA) 137-50 MCG/ACT SUSP 1 spray each nostril bid (Patient not taking: Reported on 01/05/2018) 1 Bottle 12  . Calcium Carbonate-Vitamin D (CALCIUM 600+D) 600-200 MG-UNIT TABS Take by mouth 2 (two) times daily.      . cetirizine (ZYRTEC) 10 MG tablet Take 10 mg by mouth daily.    . cholecalciferol (VITAMIN D) 1000 UNITS tablet Take 1,000 Units by mouth daily.    . fexofenadine (ALLEGRA) 180 MG tablet Take 180 mg by mouth daily. She alternates between different allergy medicines.    . fish oil-omega-3 fatty acids 1000 MG capsule Take 1 g by mouth daily.      . fluticasone (FLONASE) 50 MCG/ACT nasal spray USE 2 SPRAYS IN EACH NOSTRIL ONCE DAILY. 16 g 3  . meloxicam (MOBIC) 15 MG tablet Take 1 tablet (15 mg total) by mouth daily. (Patient not taking: Reported on 08/28/2018) 30 tablet 1  . montelukast (SINGULAIR) 10 MG tablet TAKE ONE TABLET BY MOUTH ONCE DAILY. 30 tablet 5  . Multiple Vitamins-Minerals (CENTRUM SILVER PO) Take 1 tablet by mouth daily.     Marland Kitchen UNABLE TO FIND Vitamin for eyes    . vitamin B-12 (CYANOCOBALAMIN)  1000 MCG tablet Take 1,000 mcg by mouth daily.     No current facility-administered medications for this visit.     REVIEW OF SYSTEMS: As above  PHYSICAL EXAM:  vitals were not taken for this visit.   General: Alert and oriented, in no acute distress Psychiatric: Judgment and insight are intact. Affect is appropriate.   LABORATORY DATA:  Lab Results  Component Value Date   WBC 6.5 12/18/2017   HGB 15.4 12/18/2017   HCT 45.0 12/18/2017   MCV 89 12/18/2017   PLT 557 (H) 12/18/2017   CMP      Component Value Date/Time   NA 145 (H) 12/18/2017 0856   K 5.0 12/18/2017 0856   CL 105 12/18/2017 0856   CO2 24 12/18/2017 0856   GLUCOSE 95 12/18/2017 0856   GLUCOSE 98 10/28/2013 0704   BUN 11 12/18/2017 0856   CREATININE 0.78 12/18/2017 0856   CREATININE 0.85 10/28/2013 0704   CALCIUM 9.6 12/18/2017 0856   PROT 6.8 12/23/2016 0819   ALBUMIN 4.3 12/23/2016 0819   AST 20 12/23/2016 0819   ALT 20 12/23/2016 0819   ALKPHOS 74 12/23/2016 0819   BILITOT 0.3 12/23/2016 0819   GFRNONAA 74 12/18/2017 0856   GFRAA 85 12/18/2017 0856         RADIOGRAPHY:  As above  IMPRESSION/PLAN: DCIS of right breast  It was a pleasure meeting the patient today. We discussed the risks, benefits, and side effects of radiotherapy. I recommend radiotherapy to the right breast to reduce her risk of locoregional recurrence by half.  We discussed that radiotherapy would be more important if the DCIS is estrogen sensitive.  However even if the DCIS is estrogen sensitive, the radiotherapy would serve as an insurance policy to prevent recurrences.  She understands that it would not improve her life span. We discussed that radiation would take approximately 3-4 weeks to complete and that I would give the patient a few weeks to heal following surgery before starting treatment planning.   We spoke about acute effects including skin irritation and fatigue. We spoke about the latest technology that is used to minimize the risk of late effects for patients undergoing radiotherapy to the breast or chest wall. No guarantees of treatment were given. The patient is enthusiastic about proceeding with treatment. I look forward to participating in the patient's care.  I will await her referral back to me for postoperative follow-up and eventual CT simulation/treatment planning.  This encounter was provided by telemedicine platform phone - pt couldn't access WEbEx The patient has given verbal consent for this type of encounter  and has been advised to only accept a meeting of this type in a secure network environment. The time spent during this encounter was 15 minutes. The attendants for this meeting include Eppie Gibson  and Clotilde Dieter.  During the encounter, Eppie Gibson was located at [facility] Radiation Oncology Department.  Clotilde Dieter was located at home.  __________________________________________   Eppie Gibson, MD

## 2018-09-03 NOTE — Progress Notes (Signed)
[place] CONSULT NOTE  Patient Care Team: Kathyrn Drown, MD as PCP - General (Family Medicine)  CHIEF COMPLAINTS/PURPOSE OF CONSULTATION:  Newly diagnosed breast cancer  HISTORY OF PRESENTING ILLNESS:  Yvonne Maynard 78 y.o. female is here because of recent diagnosis of DCIS of the right breast. The cancer was detected on a routine screening mammogram on 08/01/18. Right diagnostic mammogram and [place] on 08/01/18 showed a 0.6cm mass in the right breast 5cm from the nipple with no axillary adenopathy. Biopsy on 08/08/18 showed high grade DCIS, with insufficient tumor for receptor studies. She presents to the clinic today for initial evaluation and discussion of treatment options.   I reviewed her records extensively and collaborated the history with the patient.  SUMMARY OF ONCOLOGIC HISTORY: Oncology History  Malignant neoplasm of upper-outer quadrant of right female breast ([name])  08/08/2018 Cancer Staging   Staging form: Breast, AJCC 8th Edition - Clinical stage from 08/08/2018: Stage 0 (cTis (DCIS), cN0, cM0, ER: Unknown, PR: Unknown, HER2: Not Assessed) - Signed by Nicholas Lose, MD on 09/04/2018   08/15/2018 Initial Diagnosis   Routine screening mammogram detected a 0.6cm mass in the right breast, 5cm from the nipple with no axillary adenopathy. Biopsy showed high grade DCIS, with insufficient tumor for receptor studies.     MEDICAL HISTORY:  Past Medical History:  Diagnosis Date  . Asthma   . Cerebrovascular disease   . Diverticulosis of colon 08/19/2010   Per CT scan  . DJD (degenerative joint disease) of cervical spine   . Generalized convulsive seizures ([place]) 30 YEARS AGO   Remotely, following head injury from [place]  . Osteopenia   . Vertigo 08/2010    SURGICAL HISTORY: Past Surgical History:  Procedure Laterality Date  . COLONOSCOPY    . TONSILLECTOMY    . TUBAL LIGATION    . TUBAL LIGATION      SOCIAL HISTORY: Social History   Socioeconomic  History  . Marital status: Married    Spouse name: Not on file  . Number of children: Not on file  . Years of education: Not on file  . Highest education level: Not on file  Occupational History  . Not on file  Social Needs  . Financial resource strain: Not on file  . Food insecurity    Worry: Not on file    Inability: Not on file  . Transportation needs    Medical: No    Non-medical: No  Tobacco Use  . Smoking status: Never Smoker  . Smokeless tobacco: Never Used  Substance and Sexual Activity  . Alcohol use: No  . Drug use: No  . Sexual activity: Yes  Lifestyle  . Physical activity    Days per week: Not on file    Minutes per session: Not on file  . Stress: Not on file  Relationships  . Social Herbalist on phone: Not on file    Gets together: Not on file    Attends religious service: Not on file    Active member of club or organization: Not on file    Attends meetings of clubs or organizations: Not on file    Relationship status: Not on file  . Intimate partner violence    Fear of current or ex partner: No    Emotionally abused: No    Physically abused: No    Forced sexual activity: No  Other Topics Concern  . Not on file  Social History  Narrative  . Not on file    FAMILY HISTORY: Family History  Problem Relation Age of Onset  . Crohn's disease Mother   . Colon cancer Maternal Uncle 70    ALLERGIES:  is allergic to tetanus toxoids.  MEDICATIONS:  Current Outpatient Medications  Medication Sig Dispense Refill  . albuterol (PROVENTIL HFA;VENTOLIN HFA) 108 (90 BASE) MCG/ACT inhaler Inhale 2 puffs into the lungs 2 (two) times daily as needed for wheezing or shortness of breath. 1 Inhaler 0  . aspirin EC 81 MG tablet Take 81 mg by mouth daily.      . Azelastine-Fluticasone (DYMISTA) 137-50 MCG/ACT SUSP 1 spray each nostril bid (Patient not taking: Reported on 01/05/2018) 1 Bottle 12  . Calcium Carbonate-Vitamin D (CALCIUM 600+D) 600-200 MG-UNIT  TABS Take by mouth 2 (two) times daily.      . cetirizine (ZYRTEC) 10 MG tablet Take 10 mg by mouth daily.    . cholecalciferol (VITAMIN D) 1000 UNITS tablet Take 1,000 Units by mouth daily.    . fexofenadine (ALLEGRA) 180 MG tablet Take 180 mg by mouth daily. She alternates between different allergy medicines.    . fish oil-omega-3 fatty acids 1000 MG capsule Take 1 g by mouth daily.      . fluticasone (FLONASE) 50 MCG/ACT nasal spray USE 2 SPRAYS IN EACH NOSTRIL ONCE DAILY. 16 g 3  . meloxicam (MOBIC) 15 MG tablet Take 1 tablet (15 mg total) by mouth daily. (Patient not taking: Reported on 08/28/2018) 30 tablet 1  . montelukast (SINGULAIR) 10 MG tablet TAKE ONE TABLET BY MOUTH ONCE DAILY. 30 tablet 5  . Multiple Vitamins-Minerals (CENTRUM SILVER PO) Take 1 tablet by mouth daily.     Marland Kitchen UNABLE TO FIND Vitamin for eyes    . vitamin B-12 (CYANOCOBALAMIN) 1000 MCG tablet Take 1,000 mcg by mouth daily.     No current facility-administered medications for this visit.     REVIEW OF SYSTEMS:   Constitutional: Denies fevers, chills or abnormal night sweats Eyes: Denies blurriness of vision, double vision or watery eyes Ears, nose, mouth, throat, and face: Denies mucositis or sore throat Respiratory: Denies cough, dyspnea or wheezes Cardiovascular: Denies palpitation, chest discomfort or lower extremity swelling Gastrointestinal:  Denies nausea, heartburn or change in bowel habits Skin: Denies abnormal skin rashes Lymphatics: Denies new lymphadenopathy or easy bruising Neurological:Denies numbness, tingling or new weaknesses Behavioral/Psych: Mood is stable, no new changes  Breast: Denies any palpable lumps or discharge All other systems were reviewed with the patient and are negative.  PHYSICAL EXAMINATION: ECOG PERFORMANCE STATUS: 0 - Asymptomatic  Vitals:   09/04/18 1253  BP: 130/75  Pulse: 75  Resp: 18  Temp: 98.9 F (37.2 C)  SpO2: 99%   Filed Weights   09/04/18 1253  Weight:  133 lb (60.3 kg)    GENERAL:alert, no distress and comfortable SKIN: skin color, texture, turgor are normal, no rashes or significant lesions EYES: normal, conjunctiva are pink and non-injected, sclera clear OROPHARYNX:no exudate, no erythema and lips, buccal mucosa, and tongue normal  NECK: supple, thyroid normal size, non-tender, without nodularity LYMPH:  no palpable lymphadenopathy in the cervical, axillary or inguinal LUNGS: clear to auscultation and percussion with normal breathing effort HEART: regular rate & rhythm and no murmurs and no lower extremity edema ABDOMEN:abdomen soft, non-tender and normal bowel sounds Musculoskeletal:no cyanosis of digits and no clubbing  PSYCH: alert & oriented x 3 with fluent speech NEURO: no focal motor/sensory deficits BREAST: No palpable nodules in  breast. No palpable axillary or supraclavicular lymphadenopathy (exam performed in the presence of a chaperone)   LABORATORY DATA:  I have reviewed the data as listed Lab Results  Component Value Date   WBC 6.5 12/18/2017   HGB 15.4 12/18/2017   HCT 45.0 12/18/2017   MCV 89 12/18/2017   PLT 557 (H) 12/18/2017   Lab Results  Component Value Date   NA 145 (H) 12/18/2017   K 5.0 12/18/2017   CL 105 12/18/2017   CO2 24 12/18/2017    RADIOGRAPHIC STUDIES: I have personally reviewed the radiological reports and agreed with the findings in the report.  ASSESSMENT AND PLAN:  Malignant neoplasm of upper-outer quadrant of right female breast ([place]) 08/15/2018:Routine screening mammogram detected a 0.6cm mass in the right breast, 5cm from the nipple with no axillary adenopathy. Biopsy showed high grade DCIS, with insufficient tumor for receptor studies.  Pathology review: I discussed with the patient the difference between DCIS and invasive breast cancer. It is considered a precancerous lesion. DCIS is classified as a 0. It is generally detected through mammograms as calcifications. We discussed the  significance of grades and its impact on prognosis. We also discussed the importance of ER and PR receptors and their implications to adjuvant treatment options.  These results are not available because of insufficient tissue on the biopsy.  Prognosis of DCIS dependence on grade, comedo necrosis. It is anticipated that if not treated, 20-30% of DCIS can develop into invasive breast cancer.  Recommendation: 1. Breast conserving surgery 2. Followed by adjuvant radiation therapy 3. Followed by antiestrogen therapy with tamoxifen 5 years if it is estrogen receptor positive.  Tamoxifen counseling: We discussed the risks and benefits of tamoxifen. These include but not limited to insomnia, hot flashes, mood changes, vaginal dryness, and weight gain. Although rare, serious side effects including endometrial cancer, risk of blood clots were also discussed. We strongly believe that the benefits far outweigh the risks. Patient understands these risks and consented to starting treatment. Planned treatment duration is 5 years.  Return to clinic after surgery to discuss the final pathology report and come up with an adjuvant treatment plan.     All questions were answered. The patient knows to call the clinic with any problems, questions or concerns.   Rulon Eisenmenger, MD 09/04/2018    I, Molly Dorshimer, am acting as scribe for Nicholas Lose, MD.  I have reviewed the above documentation for accuracy and completeness, and I agree with the above.

## 2018-09-04 ENCOUNTER — Other Ambulatory Visit: Payer: Self-pay

## 2018-09-04 ENCOUNTER — Inpatient Hospital Stay: Payer: PPO | Attending: Hematology and Oncology | Admitting: Hematology and Oncology

## 2018-09-04 DIAGNOSIS — D0511 Intraductal carcinoma in situ of right breast: Secondary | ICD-10-CM | POA: Insufficient documentation

## 2018-09-04 DIAGNOSIS — C50411 Malignant neoplasm of upper-outer quadrant of right female breast: Secondary | ICD-10-CM

## 2018-09-04 DIAGNOSIS — M8580 Other specified disorders of bone density and structure, unspecified site: Secondary | ICD-10-CM | POA: Insufficient documentation

## 2018-09-04 NOTE — Assessment & Plan Note (Signed)
08/15/2018:Routine screening mammogram detected a 0.6cm mass in the right breast, 5cm from the nipple with no axillary adenopathy. Biopsy showed high grade DCIS, with insufficient tumor for receptor studies.  Pathology review: I discussed with the patient the difference between DCIS and invasive breast cancer. It is considered a precancerous lesion. DCIS is classified as a 0. It is generally detected through mammograms as calcifications. We discussed the significance of grades and its impact on prognosis. We also discussed the importance of ER and PR receptors and their implications to adjuvant treatment options.  These results are not available because of insufficient tissue on the biopsy.  Prognosis of DCIS dependence on grade, comedo necrosis. It is anticipated that if not treated, 20-30% of DCIS can develop into invasive breast cancer.  Recommendation: 1. Breast conserving surgery 2. Followed by adjuvant radiation therapy 3. Followed by antiestrogen therapy with tamoxifen 5 years if it is estrogen receptor positive.  Tamoxifen counseling: We discussed the risks and benefits of tamoxifen. These include but not limited to insomnia, hot flashes, mood changes, vaginal dryness, and weight gain. Although rare, serious side effects including endometrial cancer, risk of blood clots were also discussed. We strongly believe that the benefits far outweigh the risks. Patient understands these risks and consented to starting treatment. Planned treatment duration is 5 years.  Return to clinic after surgery to discuss the final pathology report and come up with an adjuvant treatment plan.

## 2018-09-05 ENCOUNTER — Encounter: Payer: Self-pay | Admitting: *Deleted

## 2018-09-06 ENCOUNTER — Telehealth: Payer: Self-pay | Admitting: Hematology and Oncology

## 2018-09-06 NOTE — Telephone Encounter (Signed)
I talk with patient regarding doximity visit

## 2018-09-14 ENCOUNTER — Encounter (HOSPITAL_BASED_OUTPATIENT_CLINIC_OR_DEPARTMENT_OTHER): Payer: Self-pay | Admitting: *Deleted

## 2018-09-14 ENCOUNTER — Other Ambulatory Visit: Payer: Self-pay

## 2018-09-15 ENCOUNTER — Other Ambulatory Visit: Payer: Self-pay | Admitting: Family Medicine

## 2018-09-16 ENCOUNTER — Telehealth: Payer: Self-pay | Admitting: Family Medicine

## 2018-09-16 NOTE — Telephone Encounter (Signed)
Calling the patient to wish her well regarding her upcoming surgery for breast cancer

## 2018-09-16 NOTE — Telephone Encounter (Signed)
Also her bone density shows osteopenia with increased risk of fracture we will touch base regarding treatment more than likely patient will do follow-up once she is over her breast surgery

## 2018-09-18 ENCOUNTER — Other Ambulatory Visit: Payer: Self-pay

## 2018-09-18 ENCOUNTER — Other Ambulatory Visit (HOSPITAL_COMMUNITY): Payer: Medicare Other

## 2018-09-18 ENCOUNTER — Other Ambulatory Visit (HOSPITAL_COMMUNITY)
Admission: RE | Admit: 2018-09-18 | Discharge: 2018-09-18 | Disposition: A | Discharge status: Home / Self Care | Payer: PPO | Source: Ambulatory Visit | Attending: Surgery | Admitting: Surgery

## 2018-09-18 DIAGNOSIS — Z20828 Contact with and (suspected) exposure to other viral communicable diseases: Secondary | ICD-10-CM | POA: Insufficient documentation

## 2018-09-18 DIAGNOSIS — C50911 Malignant neoplasm of unspecified site of right female breast: Secondary | ICD-10-CM | POA: Diagnosis not present

## 2018-09-18 DIAGNOSIS — Z01812 Encounter for preprocedural laboratory examination: Secondary | ICD-10-CM | POA: Diagnosis not present

## 2018-09-18 LAB — SARS CORONAVIRUS 2 (TAT 6-24 HRS): SARS Coronavirus 2: NEGATIVE

## 2018-09-20 DIAGNOSIS — C50411 Malignant neoplasm of upper-outer quadrant of right female breast: Secondary | ICD-10-CM | POA: Diagnosis not present

## 2018-09-20 DIAGNOSIS — Z0000 Encounter for general adult medical examination without abnormal findings: Secondary | ICD-10-CM | POA: Diagnosis not present
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
## 2018-09-20 NOTE — Progress Notes (Addendum)

## 2018-09-21 ENCOUNTER — Encounter (HOSPITAL_BASED_OUTPATIENT_CLINIC_OR_DEPARTMENT_OTHER): Payer: Self-pay | Admitting: Certified Registered"

## 2018-09-21 ENCOUNTER — Ambulatory Visit (HOSPITAL_BASED_OUTPATIENT_CLINIC_OR_DEPARTMENT_OTHER): Payer: PPO | Admitting: Certified Registered"

## 2018-09-21 ENCOUNTER — Ambulatory Visit (HOSPITAL_BASED_OUTPATIENT_CLINIC_OR_DEPARTMENT_OTHER)
Admission: RE | Admit: 2018-09-21 | Discharge: 2018-09-21 | Disposition: A | Discharge status: Home / Self Care | Payer: PPO | Attending: Surgery | Admitting: Surgery

## 2018-09-21 ENCOUNTER — Other Ambulatory Visit: Payer: Self-pay

## 2018-09-21 ENCOUNTER — Encounter (HOSPITAL_BASED_OUTPATIENT_CLINIC_OR_DEPARTMENT_OTHER)
Admission: RE | Disposition: A | Discharge status: Home / Self Care | Payer: Self-pay | Source: Home / Self Care | Attending: Surgery

## 2018-09-21 DIAGNOSIS — Z803 Family history of malignant neoplasm of breast: Secondary | ICD-10-CM | POA: Insufficient documentation

## 2018-09-21 DIAGNOSIS — C50911 Malignant neoplasm of unspecified site of right female breast: Secondary | ICD-10-CM | POA: Diagnosis not present

## 2018-09-21 DIAGNOSIS — D0511 Intraductal carcinoma in situ of right breast: Secondary | ICD-10-CM | POA: Insufficient documentation

## 2018-09-21 DIAGNOSIS — M1990 Unspecified osteoarthritis, unspecified site: Secondary | ICD-10-CM | POA: Diagnosis not present

## 2018-09-21 DIAGNOSIS — C50411 Malignant neoplasm of upper-outer quadrant of right female breast: Secondary | ICD-10-CM | POA: Diagnosis present

## 2018-09-21 DIAGNOSIS — Z171 Estrogen receptor negative status [ER-]: Secondary | ICD-10-CM | POA: Diagnosis not present

## 2018-09-21 DIAGNOSIS — Z0000 Encounter for general adult medical examination without abnormal findings: Secondary | ICD-10-CM | POA: Diagnosis not present

## 2018-09-21 DIAGNOSIS — J45909 Unspecified asthma, uncomplicated: Secondary | ICD-10-CM | POA: Diagnosis not present

## 2018-09-21 HISTORY — DX: Unspecified convulsions: R56.9

## 2018-09-21 HISTORY — DX: Unspecified macular degeneration: H35.30

## 2018-09-21 HISTORY — PX: BREAST LUMPECTOMY WITH RADIOACTIVE SEED LOCALIZATION: SHX6424

## 2018-09-21 SURGERY — BREAST LUMPECTOMY WITH RADIOACTIVE SEED LOCALIZATION
Anesthesia: General | Site: Breast | Laterality: Right

## 2018-09-21 MED ORDER — PROPOFOL 500 MG/50ML IV EMUL
INTRAVENOUS | Status: AC
  Filled 2018-09-21: qty 150

## 2018-09-21 MED ORDER — PROPOFOL 500 MG/50ML IV EMUL
INTRAVENOUS | Status: DC | PRN
  Administered 2018-09-21: 25 ug/kg/min via INTRAVENOUS

## 2018-09-21 MED ORDER — PROPOFOL 10 MG/ML IV BOLUS
INTRAVENOUS | Status: DC | PRN
  Administered 2018-09-21: 150 mg via INTRAVENOUS

## 2018-09-21 MED ORDER — MIDAZOLAM HCL 2 MG/2ML IJ SOLN: 1.0000 mg | INTRAMUSCULAR | Status: DC | PRN

## 2018-09-21 MED ORDER — CHLORHEXIDINE GLUCONATE CLOTH 2 % EX PADS: 6.0000 | MEDICATED_PAD | Freq: Once | CUTANEOUS | Status: DC

## 2018-09-21 MED ORDER — GABAPENTIN 300 MG PO CAPS
300.0000 mg | ORAL_CAPSULE | ORAL | Status: AC
  Administered 2018-09-21: 300 mg via ORAL

## 2018-09-21 MED ORDER — LIDOCAINE 2% (20 MG/ML) 5 ML SYRINGE
INTRAMUSCULAR | Status: AC
  Filled 2018-09-21: qty 20

## 2018-09-21 MED ORDER — FENTANYL CITRATE (PF) 100 MCG/2ML IJ SOLN
50.0000 ug | INTRAMUSCULAR | Status: DC | PRN
  Administered 2018-09-21: 50 ug via INTRAVENOUS

## 2018-09-21 MED ORDER — ACETAMINOPHEN 500 MG PO TABS
ORAL_TABLET | ORAL | Status: AC
  Filled 2018-09-21: qty 2

## 2018-09-21 MED ORDER — FENTANYL CITRATE (PF) 100 MCG/2ML IJ SOLN
INTRAMUSCULAR | Status: AC
  Filled 2018-09-21: qty 2

## 2018-09-21 MED ORDER — LIDOCAINE HCL (CARDIAC) PF 100 MG/5ML IV SOSY
PREFILLED_SYRINGE | INTRAVENOUS | Status: DC | PRN
  Administered 2018-09-21: 60 mg via INTRAVENOUS

## 2018-09-21 MED ORDER — PROMETHAZINE HCL 25 MG/ML IJ SOLN: 6.2500 mg | INTRAMUSCULAR | Status: DC | PRN

## 2018-09-21 MED ORDER — CEFAZOLIN SODIUM-DEXTROSE 2-4 GM/100ML-% IV SOLN
2.0000 g | INTRAVENOUS | Status: AC
  Administered 2018-09-21: 09:00:00 2 g via INTRAVENOUS

## 2018-09-21 MED ORDER — BUPIVACAINE-EPINEPHRINE 0.25% -1:200000 IJ SOLN
INTRAMUSCULAR | Status: DC | PRN
  Administered 2018-09-21: 30 mL

## 2018-09-21 MED ORDER — ONDANSETRON HCL 4 MG/2ML IJ SOLN
INTRAMUSCULAR | Status: AC
  Filled 2018-09-21: qty 16

## 2018-09-21 MED ORDER — GABAPENTIN 300 MG PO CAPS
ORAL_CAPSULE | ORAL | Status: AC
  Filled 2018-09-21: qty 1

## 2018-09-21 MED ORDER — BUPIVACAINE-EPINEPHRINE (PF) 0.25% -1:200000 IJ SOLN
INTRAMUSCULAR | Status: AC
  Filled 2018-09-21: qty 30

## 2018-09-21 MED ORDER — ONDANSETRON HCL 4 MG/2ML IJ SOLN
INTRAMUSCULAR | Status: DC | PRN
  Administered 2018-09-21: 4 mg via INTRAVENOUS

## 2018-09-21 MED ORDER — DEXAMETHASONE SODIUM PHOSPHATE 10 MG/ML IJ SOLN
INTRAMUSCULAR | Status: AC
  Filled 2018-09-21: qty 3

## 2018-09-21 MED ORDER — DEXAMETHASONE SODIUM PHOSPHATE 4 MG/ML IJ SOLN
INTRAMUSCULAR | Status: DC | PRN
  Administered 2018-09-21: 10 mg via INTRAVENOUS

## 2018-09-21 MED ORDER — LACTATED RINGERS IV SOLN
INTRAVENOUS | Status: DC
  Administered 2018-09-21: 08:00:00 via INTRAVENOUS

## 2018-09-21 MED ORDER — TRAMADOL HCL 50 MG PO TABS: 50.0000 mg | ORAL_TABLET | Freq: Four times a day (QID) | ORAL | 0 refills | Status: DC | PRN

## 2018-09-21 MED ORDER — OXYCODONE HCL 5 MG/5ML PO SOLN: 5.0000 mg | Freq: Once | ORAL | Status: DC | PRN

## 2018-09-21 MED ORDER — CEFAZOLIN SODIUM-DEXTROSE 2-4 GM/100ML-% IV SOLN
INTRAVENOUS | Status: AC
  Filled 2018-09-21: qty 100

## 2018-09-21 MED ORDER — HYDROMORPHONE HCL 1 MG/ML IJ SOLN: 0.2500 mg | INTRAMUSCULAR | Status: DC | PRN

## 2018-09-21 MED ORDER — ACETAMINOPHEN 500 MG PO TABS
1000.0000 mg | ORAL_TABLET | ORAL | Status: AC
  Administered 2018-09-21: 1000 mg via ORAL

## 2018-09-21 MED ORDER — OXYCODONE HCL 5 MG PO TABS: 5.0000 mg | ORAL_TABLET | Freq: Once | ORAL | Status: DC | PRN

## 2018-09-21 SURGICAL SUPPLY — 61 items
ADH SKN CLS APL DERMABOND .7 (GAUZE/BANDAGES/DRESSINGS) ×1
APL PRP STRL LF DISP 70% ISPRP (MISCELLANEOUS) ×1
APL SKNCLS STERI-STRIP NONHPOA (GAUZE/BANDAGES/DRESSINGS)
BENZOIN TINCTURE PRP APPL 2/3 (GAUZE/BANDAGES/DRESSINGS) IMPLANT
BINDER BREAST LRG (GAUZE/BANDAGES/DRESSINGS) IMPLANT
BINDER BREAST MEDIUM (GAUZE/BANDAGES/DRESSINGS) ×2 IMPLANT
BINDER BREAST XLRG (GAUZE/BANDAGES/DRESSINGS) IMPLANT
BINDER BREAST XXLRG (GAUZE/BANDAGES/DRESSINGS) IMPLANT
BLADE HEX COATED 2.75 (ELECTRODE) IMPLANT
BLADE SURG 10 STRL SS (BLADE) ×3 IMPLANT
BLADE SURG 15 STRL LF DISP TIS (BLADE) ×1 IMPLANT
BLADE SURG 15 STRL SS (BLADE) ×3
CANISTER SUC SOCK COL 7IN (MISCELLANEOUS) IMPLANT
CANISTER SUCT 1200ML W/VALVE (MISCELLANEOUS) ×3 IMPLANT
CHLORAPREP W/TINT 26 (MISCELLANEOUS) ×3 IMPLANT
CLIP VESOCCLUDE SM WIDE 6/CT (CLIP) IMPLANT
CLOSURE WOUND 1/4X4 (GAUZE/BANDAGES/DRESSINGS)
COVER BACK TABLE REUSABLE LG (DRAPES) ×3 IMPLANT
COVER MAYO STAND REUSABLE (DRAPES) ×3 IMPLANT
COVER PROBE W GEL 5X96 (DRAPES) ×3 IMPLANT
COVER WAND RF STERILE (DRAPES) IMPLANT
DECANTER SPIKE VIAL GLASS SM (MISCELLANEOUS) ×2 IMPLANT
DERMABOND ADVANCED (GAUZE/BANDAGES/DRESSINGS) ×2
DERMABOND ADVANCED .7 DNX12 (GAUZE/BANDAGES/DRESSINGS) ×1 IMPLANT
DRAPE HALF SHEET 70X43 (DRAPES) IMPLANT
DRAPE LAPAROTOMY 100X72 PEDS (DRAPES) ×3 IMPLANT
DRAPE UTILITY XL STRL (DRAPES) ×3 IMPLANT
DRSG PAD ABDOMINAL 8X10 ST (GAUZE/BANDAGES/DRESSINGS) ×2 IMPLANT
ELECT COATED BLADE 2.86 ST (ELECTRODE) ×3 IMPLANT
ELECT REM PT RETURN 9FT ADLT (ELECTROSURGICAL) ×3
ELECTRODE REM PT RTRN 9FT ADLT (ELECTROSURGICAL) ×1 IMPLANT
GAUZE SPONGE 4X4 12PLY STRL LF (GAUZE/BANDAGES/DRESSINGS) IMPLANT
GLOVE BIOGEL PI IND STRL 7.0 (GLOVE) IMPLANT
GLOVE BIOGEL PI IND STRL 7.5 (GLOVE) IMPLANT
GLOVE BIOGEL PI INDICATOR 7.0 (GLOVE) ×4
GLOVE BIOGEL PI INDICATOR 7.5 (GLOVE) ×2
GLOVE SURG SS PI 7.5 STRL IVOR (GLOVE) ×2 IMPLANT
GLOVE SURG SYN 7.5  E (GLOVE) ×2
GLOVE SURG SYN 7.5 E (GLOVE) ×1 IMPLANT
GLOVE SURG SYN 7.5 PF PI (GLOVE) ×1 IMPLANT
GOWN STRL REUS W/ TWL LRG LVL3 (GOWN DISPOSABLE) ×1 IMPLANT
GOWN STRL REUS W/ TWL XL LVL3 (GOWN DISPOSABLE) ×1 IMPLANT
GOWN STRL REUS W/TWL LRG LVL3 (GOWN DISPOSABLE) ×3
GOWN STRL REUS W/TWL XL LVL3 (GOWN DISPOSABLE) ×6
KIT MARKER MARGIN INK (KITS) ×3 IMPLANT
NDL HYPO 25X1 1.5 SAFETY (NEEDLE) ×1 IMPLANT
NEEDLE HYPO 25X1 1.5 SAFETY (NEEDLE) ×3 IMPLANT
NS IRRIG 1000ML POUR BTL (IV SOLUTION) ×2 IMPLANT
PACK BASIN DAY SURGERY FS (CUSTOM PROCEDURE TRAY) ×3 IMPLANT
PENCIL BUTTON HOLSTER BLD 10FT (ELECTRODE) ×3 IMPLANT
SLEEVE SCD COMPRESS KNEE MED (MISCELLANEOUS) ×3 IMPLANT
SPONGE LAP 18X18 RF (DISPOSABLE) ×3 IMPLANT
STRIP CLOSURE SKIN 1/4X4 (GAUZE/BANDAGES/DRESSINGS) IMPLANT
SUT MNCRL AB 4-0 PS2 18 (SUTURE) ×3 IMPLANT
SUT VICRYL 3-0 CR8 SH (SUTURE) ×3 IMPLANT
SYR CONTROL 10ML LL (SYRINGE) ×3 IMPLANT
TOWEL GREEN STERILE FF (TOWEL DISPOSABLE) ×3 IMPLANT
TRAY FAXITRON CT DISP (TRAY / TRAY PROCEDURE) ×3 IMPLANT
TUBE CONNECTING 20'X1/4 (TUBING) ×1
TUBE CONNECTING 20X1/4 (TUBING) ×2 IMPLANT
YANKAUER SUCT BULB TIP NO VENT (SUCTIONS) ×3 IMPLANT

## 2018-09-21 NOTE — Anesthesia Procedure Notes (Signed)
Procedure Name: LMA Insertion Date/Time: 09/21/2018 9:14 AM Performed by: Signe Colt, CRNA Pre-anesthesia Checklist: Patient identified, Emergency Drugs available, Suction available and Patient being monitored Patient Re-evaluated:Patient Re-evaluated prior to induction Oxygen Delivery Method: Circle system utilized Preoxygenation: Pre-oxygenation with 100% oxygen Induction Type: IV induction Ventilation: Mask ventilation without difficulty LMA: LMA inserted LMA Size: 4.0 Number of attempts: 1 Airway Equipment and Method: Bite block Placement Confirmation: positive ETCO2 Tube secured with: Tape Dental Injury: Teeth and Oropharynx as per pre-operative assessment

## 2018-09-21 NOTE — Op Note (Signed)
09/21/2018  10:07 AM  PATIENT:  Yvonne Maynard DOB: 1940/05/30 MRN: [id number]  PREOP DIAGNOSIS:   right breast cancer  POSTOP DIAGNOSIS:    Right breast cancer, 10 o'clock position (Tis, N0)  PROCEDURE:   Procedure(s):    RIGHT BREAST LUMPECTOMY WITH RADIOACTIVE SEED LOCALIZATION  SURGEON:   Alphonsa Overall, M.D.  ANESTHESIA:   General  No anesthesia staff entered.  General  EBL:  50  ml  DRAINS:  none   LOCAL MEDICATIONS USED:   30 cc 1/4% marcaine  SPECIMEN:   Right breast lumpectomy (6 color paint)  COUNTS CORRECT:  YES  INDICATIONS FOR PROCEDURE:  LILYAUNA Maynard is a 78 y.o. (DOB: 03-Oct-1940) white female whose primary care physician is Luking, Elayne Snare, MD and comes for right breast lumpectomy.   Her right breast biopsy on 08/08/2018 at [facility] showed DCIS.  She has seen Drs. Philis Nettle for oncology.  The options for breast cancer treatment have been discussed with the patient. She elected to proceed with lumpectomy.  She does not need a sentinel lymph node biopsy.     The indications and potential complications of surgery were explained to the patient. Potential complications include, but are not limited to, bleeding, infection, the need for further surgery, and nerve injury.     She had a I131 seed placed on 09/20/2018 in her right breast at [facility].  The seed is in the 10 o'clock position of the right breast.  OPERATIVE NOTE:   The patient was taken to operating room # 5 at [facility] where she underwent a general anesthesia  supervised by No anesthesia staff entered.. Her right breast and axilla were prepped with  ChloraPrep and sterilely draped.    A time-out and the surgical check list was reviewed.    The cancer was about at the 10 o'clock position of the right breast.   It was 3 cm from the areola.  Because the seed looked superficial, I made an incision directly over the tumor.  I used the Neoprobe to identify the I131 seed.  I tried to excise an area  around the tumor of at least 1 cm.    I excised this block of breast tissue approximately 3 cm by 4 cm  in diameter.   I painted the lumpectomy specimen with the 6 color paint kit and did a specimen mammogram which confirmed the mass, clip, and the seed were all in the right position in the specimen.  The specimen was sent to pathology who called back to confirm that they have the seed and the specimen.   I then irrigated the wound with saline. I infiltrated approximately 30 mL of 1/4% Marcaine in the incision. I placed 4 clips to mark biopsy cavity, at 12, 3, 6, and 9 o'clock.  I then closed the wound in layers using 3-0 Vicryl sutures for the deep layer. At the skin, I closed the incisions with a 4-0 Monocryl suture. The incisions were then painted with Dermabond.  She had gauze place over the wounds and placed in a breast binder.   The patient tolerated the procedure well, was transported to the recovery room in good condition. Sponge and needle count were correct at the end of the case.   Final pathology is pending.   Alphonsa Overall, MD, [facility] Pager: [phone] Office phone:  [phone]

## 2018-09-21 NOTE — Interval H&P Note (Signed)
History and Physical Interval Note:  09/21/2018 9:02 AM  Yvonne Maynard  has presented today for surgery, with the diagnosis of right breast cancer.  The various methods of treatment have been discussed with the patient and family.   Husband here.  After consideration of risks, benefits and other options for treatment, the patient has consented to  Procedure(s): RIGHT BREAST LUMPECTOMY WITH RADIOACTIVE SEED LOCALIZATION (Right) as a surgical intervention.  The patient's history has been reviewed, patient examined, no change in status, stable for surgery.  I have reviewed the patient's chart and labs.  Questions were answered to the patient's satisfaction.     Shann Medal

## 2018-09-21 NOTE — Discharge Instructions (Signed)
[facility] [place] - DISCHARGE INSTRUCTIONS TO PATIENT  Activity:  Driving - Nay drive in 1 to 3 days, if doing well   Lifting - No lifting more than 15 pounds for 5 days, then no limit.                       Practice you Covid-19 protection:  Wear a mask, social distance, and wash your hands frequently  Wound Care:   Leave the incision dry for 2 days, then you may shower  Diet:  As tolerated  Follow up appointment:  Call Dr. Pollie Friar office [facility] [place] [facility]) at [phone] for an appointment in 2 to 3 weeks..  Medications and dosages:  Resume your home medications.  You have a prescription for:  Ultram  Call Dr. Lucia Gaskins or his office  [phone]) if you have:  Temperature greater than 100.4,  Persistent nausea and vomiting,  Severe uncontrolled pain,  Redness, tenderness, or signs of infection (pain, swelling, redness, odor or green/yellow discharge around the site),  Difficulty breathing, headache or visual disturbances,  Any other questions or concerns you may have after discharge.  In an emergency, call 911 or go to an Emergency Department at a nearby hospital.   [place] Instructions  No tylenol until after 2:30 today.    Activity: Get plenty of rest for the remainder of the day. A responsible individual must stay with you for 24 hours following the procedure.  For the next 24 hours, DO NOT: -Drive a car -Paediatric nurse -Drink alcoholic beverages -Take any medication unless instructed by your physician -Make any legal decisions or sign important papers.  Meals: Start with liquid foods such as gelatin or soup. Progress to regular foods as tolerated. Avoid greasy, spicy, heavy foods. If nausea and/or vomiting occur, drink only clear liquids until the nausea and/or vomiting subsides. Call your physician if vomiting continues.  Special Instructions/Symptoms: Your throat may feel dry or sore from the anesthesia or the breathing  tube placed in your throat during surgery. If this causes discomfort, gargle with warm salt water. The discomfort should disappear within 24 hours.  If you had a scopolamine patch placed behind your ear for the management of post- operative nausea and/or vomiting:  1. The medication in the patch is effective for 72 hours, after which it should be removed.  Wrap patch in a tissue and discard in the trash. Wash hands thoroughly with soap and water. 2. You may remove the patch earlier than 72 hours if you experience unpleasant side effects which may include dry mouth, dizziness or visual disturbances. 3. Avoid touching the patch. Wash your hands with soap and water after contact with the patch.

## 2018-09-21 NOTE — Transfer of Care (Signed)
Immediate Anesthesia Transfer of Care Note  Patient: KENIAH DONOFRIO  Procedure(s) Performed: RIGHT BREAST LUMPECTOMY WITH RADIOACTIVE SEED LOCALIZATION (Right Breast)  Patient Location: PACU  Anesthesia Type:General  Level of Consciousness: awake, alert , oriented and patient cooperative  Airway & Oxygen Therapy: Patient Spontanous Breathing and Patient connected to face mask oxygen  Post-op Assessment: Report given to RN and Post -op Vital signs reviewed and stable  Post vital signs: Reviewed and stable  Last Vitals:  Vitals Value Taken Time  BP    Temp    Pulse 105 09/21/18 1011  Resp 26 09/21/18 1011  SpO2 100 % 09/21/18 1011  Vitals shown include unvalidated device data.  Last Pain:  Vitals:   09/21/18 0812  TempSrc: Oral      Patients Stated Pain Goal: 5 ([id number])  Complications: No apparent anesthesia complications

## 2018-09-21 NOTE — Anesthesia Postprocedure Evaluation (Signed)
Anesthesia Post Note  Patient: Yvonne Maynard  Procedure(s) Performed: RIGHT BREAST LUMPECTOMY WITH RADIOACTIVE SEED LOCALIZATION (Right Breast)     Patient location during evaluation: PACU Anesthesia Type: General Level of consciousness: awake and alert Pain management: pain level controlled Vital Signs Assessment: post-procedure vital signs reviewed and stable Respiratory status: spontaneous breathing, nonlabored ventilation and respiratory function stable Cardiovascular status: blood pressure returned to baseline and stable Postop Assessment: no apparent nausea or vomiting Anesthetic complications: no    Last Vitals:  Vitals:   09/21/18 1043 09/21/18 1045  BP: (!) 144/74 131/62  Pulse: 83   Resp: 16   Temp:  36.5 C  SpO2: 98%     Last Pain:  Vitals:   09/21/18 1045  TempSrc:   PainSc: 0-No pain                 Lynda Rainwater

## 2018-09-21 NOTE — Anesthesia Preprocedure Evaluation (Signed)
Anesthesia Evaluation  Patient identified by MRN, date of birth, ID band Patient awake    Reviewed: Allergy & Precautions, NPO status , Patient's Chart, lab work & pertinent test results  Airway Mallampati: II  TM Distance: >3 FB Neck ROM: Full    Dental no notable dental hx.    Pulmonary asthma ,    Pulmonary exam normal breath sounds clear to auscultation       Cardiovascular negative cardio ROS Normal cardiovascular exam Rhythm:Regular Rate:Normal     Neuro/Psych negative neurological ROS  negative psych ROS   GI/Hepatic negative GI ROS, Neg liver ROS,   Endo/Other  negative endocrine ROS  Renal/GU negative Renal ROS  negative genitourinary   Musculoskeletal  (+) Arthritis , Osteoarthritis,    Abdominal   Peds negative pediatric ROS (+)  Hematology negative hematology ROS (+)   Anesthesia Other Findings Breast Cancer  Reproductive/Obstetrics negative OB ROS                             Anesthesia Physical Anesthesia Plan  ASA: III  Anesthesia Plan: General   Post-op Pain Management:    Induction: Intravenous  PONV Risk Score and Plan: 3 and Ondansetron, Dexamethasone, Midazolam and Treatment may vary due to age or medical condition  Airway Management Planned: LMA  Additional Equipment:   Intra-op Plan:   Post-operative Plan: Extubation in OR  Informed Consent: I have reviewed the patients History and Physical, chart, labs and discussed the procedure including the risks, benefits and alternatives for the proposed anesthesia with the patient or authorized representative who has indicated his/her understanding and acceptance.     Dental advisory given  Plan Discussed with: CRNA  Anesthesia Plan Comments:         Anesthesia Quick Evaluation

## 2018-09-25 ENCOUNTER — Encounter (HOSPITAL_BASED_OUTPATIENT_CLINIC_OR_DEPARTMENT_OTHER): Payer: Self-pay | Admitting: Surgery

## 2018-09-27 ENCOUNTER — Encounter: Payer: Self-pay | Admitting: *Deleted

## 2018-09-27 NOTE — Progress Notes (Addendum)
HEMATOLOGY-ONCOLOGY DOXIMITY VISIT PROGRESS NOTE  I connected with Clotilde Dieter on 09/28/2018 at 10:45 AM EDT by Doximity video conference and verified that I am speaking with the correct person using two identifiers.  I discussed the limitations, risks, security and privacy concerns of performing an evaluation and management service by Doximity and the availability of in person appointments.  I also discussed with the patient that there may be a patient responsible charge related to this service. The patient expressed understanding and agreed to proceed.  Patient's Location: Home Physician Location: Clinic  CHIEF COMPLIANT: Follow-up s/p lumpectomy to review pathology   INTERVAL HISTORY: Yvonne Maynard is a 78 y.o. female with above-mentioned history of right breast DCIS. She underwent a lumpectomy on 09/20/18 with Dr. Lucia Gaskins for which pathology showed intermediate to high grade DCIS with a 0.4cm focus of invasive carcinoma, lymphovascular invasion present, clear margins. She presents over Doximity today to review the pathology report and discuss further treatment.   Oncology History  Malignant neoplasm of upper-outer quadrant of right female breast ([place])  08/08/2018 Cancer Staging   Staging form: Breast, AJCC 8th Edition - Clinical stage from 08/08/2018: Stage 0 (cTis (DCIS), cN0, cM0, ER: Unknown, PR: Unknown, HER2: Not Assessed) - Signed by Nicholas Lose, MD on 09/04/2018   08/15/2018 Initial Diagnosis   Routine screening mammogram detected a 0.6cm mass in the right breast, 5cm from the nipple with no axillary adenopathy. Biopsy showed high grade DCIS, with insufficient tumor for receptor studies.   09/20/2018 Surgery   Right lumpectomy on 09/20/18 Lucia Gaskins): intermediate to high grade DCIS with a 0.4cm focus of invasive carcinoma, lymphovascular invasion present, clear margins.     REVIEW OF SYSTEMS:   Constitutional: Denies fevers, chills or abnormal weight loss Eyes: Denies blurriness  of vision Ears, nose, mouth, throat, and face: Denies mucositis or sore throat Respiratory: Denies cough, dyspnea or wheezes Cardiovascular: Denies palpitation, chest discomfort Gastrointestinal:  Denies nausea, heartburn or change in bowel habits Skin: Denies abnormal skin rashes Lymphatics: Denies new lymphadenopathy or easy bruising Neurological:Denies numbness, tingling or new weaknesses Behavioral/Psych: Mood is stable, no new changes  Extremities: No lower extremity edema Breast: s/p right lumpectomy All other systems were reviewed with the patient and are negative.  Observations/Objective:  There were no vitals filed for this visit. There is no height or weight on file to calculate BMI.  I have reviewed the data as listed CMP Latest Ref Rng & Units 12/18/2017 12/23/2016 12/22/2015  Glucose 65 - 99 mg/dL 95 101(H) 97  BUN 8 - 27 mg/dL _0 Creatinine 0.57 - 1.00 mg/dL 0.78 0.88 0.76  Sodium 134 - 144 mmol/L 145(H) 143 142  Potassium 3.5 - 5.2 mmol/L 5.0 4.7 5.0  Chloride 96 - 106 mmol/L 105 105 103  CO2 20 - 29 mmol/L _1 Calcium 8.7 - 10.3 mg/dL 9.6 9.7 9.6  Total Protein 6.0 - 8.5 g/dL - 6.8 6.8  Total Bilirubin 0.0 - 1.2 mg/dL - 0.3 0.4  Alkaline Phos 39 - 117 IU/L - 74 68  AST 0 - 40 IU/L - 20 19  ALT 0 - 32 IU/L - 20 20    Lab Results  Component Value Date   WBC 6.5 12/18/2017   HGB 15.4 12/18/2017   HCT 45.0 12/18/2017   MCV 89 12/18/2017   PLT 557 (H) 12/18/2017   NEUTROABS 3.8 12/18/2017      Assessment Plan:  Malignant neoplasm of upper-outer quadrant of right  female breast [facility]) 09/20/2018:Right lumpectomy on 09/20/18 Lucia Gaskins): intermediate to high grade DCIS with a 0.4cm focus of invasive carcinoma, lymphovascular invasion present, clear margins. Stage Ia: Breast prognostic panel is pending  Pathology counseling: I discussed the final pathology report of the patient provided  a copy of this report. I discussed the margins as well as lymph node  surgeries. We also discussed the final staging   Breast prognostic panel is pending.  Treatment plan: We discussed the pros and cons of sentinel lymph node surgery.  I informed her that the likelihood of having a positive lymph node is going to be very low if her breast prognostic panel is favorable.  However she wants to undergo this surgery because she is extremely healthy 78 year old.  So I will send a message to Dr. Lucia Gaskins to proceed with planning for the sentinel lymph node surgery.  1.  Adjuvant radiation therapy followed by 2.  Adjuvant antiestrogen therapy with anastrozole for 5 years if estrogen receptor is positive  Return to clinic after radiation therapy is complete. I discussed the assessment and treatment plan with the patient. The patient was provided an opportunity to ask questions and all were answered. The patient agreed with the plan and demonstrated an understanding of the instructions. The patient was advised to call back or seek an in-person evaluation if the symptoms worsen or if the condition fails to improve as anticipated.   I provided 15 minutes of face-to-face Doximity time during this encounter.    Rulon Eisenmenger, MD 09/28/2018   I, Molly Dorshimer, am acting as scribe for Nicholas Lose, MD.  I have reviewed the above documentation for accuracy and completeness, and I agree with the above.   Addendum: Breast prognostic panel: ER PR negative, HER-2 equivocal by IHC, FISH pending, Ki-67 40% There is no role of antiestrogen therapy because she is ER PR negative. Because her prognostic panel is not entirely favorable, it makes sense to pursue sentinel lymph node surgery. I informed the patient of the prognostic panel.

## 2018-09-28 ENCOUNTER — Inpatient Hospital Stay: Payer: PPO | Attending: Hematology and Oncology | Admitting: Hematology and Oncology

## 2018-09-28 ENCOUNTER — Other Ambulatory Visit: Payer: Self-pay

## 2018-09-28 DIAGNOSIS — C50411 Malignant neoplasm of upper-outer quadrant of right female breast: Secondary | ICD-10-CM

## 2018-09-28 NOTE — Assessment & Plan Note (Signed)
09/20/2018:Right lumpectomy on 09/20/18 Yvonne Maynard): intermediate to high grade DCIS with a 0.4cm focus of invasive carcinoma, lymphovascular invasion present, clear margins.  Pathology counseling: I discussed the final pathology report of the patient provided  a copy of this report. I discussed the margins as well as lymph node surgeries. We also discussed the final staging   Breast prognostic panel is pending.  Treatment plan: 1.  Adjuvant radiation therapy followed by 2.  Adjuvant antiestrogen therapy with tamoxifen for 5 years if estrogen receptor is positive

## 2018-09-29 ENCOUNTER — Other Ambulatory Visit (HOSPITAL_COMMUNITY): Payer: Self-pay | Admitting: Surgery

## 2018-09-29 DIAGNOSIS — Z171 Estrogen receptor negative status [ER-]: Secondary | ICD-10-CM

## 2018-09-29 DIAGNOSIS — C50911 Malignant neoplasm of unspecified site of right female breast: Secondary | ICD-10-CM

## 2018-10-01 ENCOUNTER — Encounter: Payer: Self-pay | Admitting: *Deleted

## 2018-10-02 ENCOUNTER — Other Ambulatory Visit (HOSPITAL_COMMUNITY)
Admission: RE | Admit: 2018-10-02 | Discharge: 2018-10-02 | Disposition: A | Discharge status: Home / Self Care | Payer: PPO | Source: Ambulatory Visit | Attending: Surgery | Admitting: Surgery

## 2018-10-02 ENCOUNTER — Other Ambulatory Visit: Payer: Self-pay

## 2018-10-02 DIAGNOSIS — Z01812 Encounter for preprocedural laboratory examination: Secondary | ICD-10-CM | POA: Diagnosis not present

## 2018-10-02 DIAGNOSIS — C50911 Malignant neoplasm of unspecified site of right female breast: Secondary | ICD-10-CM | POA: Diagnosis not present

## 2018-10-02 DIAGNOSIS — Z20828 Contact with and (suspected) exposure to other viral communicable diseases: Secondary | ICD-10-CM | POA: Diagnosis not present

## 2018-10-02 LAB — SARS CORONAVIRUS 2 (TAT 6-24 HRS): SARS Coronavirus 2: NEGATIVE

## 2018-10-04 ENCOUNTER — Other Ambulatory Visit: Payer: Self-pay

## 2018-10-04 ENCOUNTER — Encounter (HOSPITAL_COMMUNITY): Payer: Self-pay | Admitting: *Deleted
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
## 2018-10-05 ENCOUNTER — Other Ambulatory Visit: Payer: Self-pay

## 2018-10-05 ENCOUNTER — Ambulatory Visit (HOSPITAL_COMMUNITY)
Admission: RE | Admit: 2018-10-05 | Discharge: 2018-10-05 | Disposition: A | Discharge status: Home / Self Care | Payer: PPO | Attending: Surgery | Admitting: Surgery

## 2018-10-05 ENCOUNTER — Encounter (HOSPITAL_COMMUNITY): Payer: Self-pay | Admitting: Certified Registered"

## 2018-10-05 ENCOUNTER — Encounter (HOSPITAL_COMMUNITY)
Admission: RE | Admit: 2018-10-05 | Discharge: 2018-10-05 | Disposition: A | Discharge status: Home / Self Care | Payer: PPO | Source: Ambulatory Visit | Attending: Surgery | Admitting: Surgery

## 2018-10-05 ENCOUNTER — Ambulatory Visit (HOSPITAL_COMMUNITY): Payer: PPO | Admitting: Anesthesiology

## 2018-10-05 ENCOUNTER — Encounter (HOSPITAL_COMMUNITY)
Admission: RE | Disposition: A | Discharge status: Home / Self Care | Payer: Self-pay | Source: Home / Self Care | Attending: Surgery

## 2018-10-05 DIAGNOSIS — K5730 Diverticulosis of large intestine without perforation or abscess without bleeding: Secondary | ICD-10-CM | POA: Diagnosis not present

## 2018-10-05 DIAGNOSIS — J45909 Unspecified asthma, uncomplicated: Secondary | ICD-10-CM | POA: Insufficient documentation

## 2018-10-05 DIAGNOSIS — C50412 Malignant neoplasm of upper-outer quadrant of left female breast: Secondary | ICD-10-CM | POA: Insufficient documentation

## 2018-10-05 DIAGNOSIS — C50411 Malignant neoplasm of upper-outer quadrant of right female breast: Secondary | ICD-10-CM | POA: Diagnosis not present

## 2018-10-05 DIAGNOSIS — Z79899 Other long term (current) drug therapy: Secondary | ICD-10-CM | POA: Diagnosis not present

## 2018-10-05 DIAGNOSIS — Z803 Family history of malignant neoplasm of breast: Secondary | ICD-10-CM | POA: Insufficient documentation

## 2018-10-05 DIAGNOSIS — C50911 Malignant neoplasm of unspecified site of right female breast: Secondary | ICD-10-CM

## 2018-10-05 DIAGNOSIS — D0591 Unspecified type of carcinoma in situ of right breast: Secondary | ICD-10-CM | POA: Diagnosis not present

## 2018-10-05 DIAGNOSIS — Z171 Estrogen receptor negative status [ER-]: Secondary | ICD-10-CM | POA: Insufficient documentation

## 2018-10-05 DIAGNOSIS — M1990 Unspecified osteoarthritis, unspecified site: Secondary | ICD-10-CM | POA: Diagnosis not present

## 2018-10-05 DIAGNOSIS — Z7982 Long term (current) use of aspirin: Secondary | ICD-10-CM | POA: Insufficient documentation

## 2018-10-05 HISTORY — PX: AXILLARY SENTINEL NODE BIOPSY: SHX5738

## 2018-10-05 HISTORY — DX: Malignant (primary) neoplasm, unspecified: C80.1

## 2018-10-05 LAB — HEMOGLOBIN: Hemoglobin: 14.4 g/dL (ref 12.0–15.0)

## 2018-10-05 SURGERY — BIOPSY, LYMPH NODE, SENTINEL, AXILLARY
Anesthesia: General | Site: Breast | Laterality: Right

## 2018-10-05 MED ORDER — PHENYLEPHRINE 40 MCG/ML (10ML) SYRINGE FOR IV PUSH (FOR BLOOD PRESSURE SUPPORT)
PREFILLED_SYRINGE | INTRAVENOUS | Status: DC | PRN
  Administered 2018-10-05 (×2): 80 ug via INTRAVENOUS

## 2018-10-05 MED ORDER — CEFAZOLIN SODIUM-DEXTROSE 2-4 GM/100ML-% IV SOLN
2.0000 g | INTRAVENOUS | Status: AC
  Administered 2018-10-05: 2 g via INTRAVENOUS
  Filled 2018-10-05: qty 100

## 2018-10-05 MED ORDER — LIDOCAINE HCL (PF) 1 % IJ SOLN
INTRAMUSCULAR | Status: DC | PRN
Start: 2018-10-05 — End: 2018-10-05
  Administered 2018-10-05: 20 mL

## 2018-10-05 MED ORDER — DEXAMETHASONE SODIUM PHOSPHATE 10 MG/ML IJ SOLN
INTRAMUSCULAR | Status: AC
  Filled 2018-10-05: qty 1

## 2018-10-05 MED ORDER — DEXAMETHASONE SODIUM PHOSPHATE 4 MG/ML IJ SOLN
INTRAMUSCULAR | Status: DC | PRN
  Administered 2018-10-05: 8 mg via INTRAVENOUS

## 2018-10-05 MED ORDER — CHLORHEXIDINE GLUCONATE CLOTH 2 % EX PADS: 6.0000 | MEDICATED_PAD | Freq: Once | CUTANEOUS | Status: DC

## 2018-10-05 MED ORDER — GLYCOPYRROLATE PF 0.2 MG/ML IJ SOSY
PREFILLED_SYRINGE | INTRAMUSCULAR | Status: DC | PRN
  Administered 2018-10-05: .1 mg via INTRAVENOUS

## 2018-10-05 MED ORDER — METHYLENE BLUE 0.5 % INJ SOLN
INTRAVENOUS | Status: AC
  Filled 2018-10-05: qty 10

## 2018-10-05 MED ORDER — GLYCOPYRROLATE PF 0.2 MG/ML IJ SOSY
PREFILLED_SYRINGE | INTRAMUSCULAR | Status: AC
  Filled 2018-10-05: qty 1

## 2018-10-05 MED ORDER — TECHNETIUM TC 99M SULFUR COLLOID FILTERED
1.0000 | Freq: Once | INTRAVENOUS | Status: AC | PRN
  Administered 2018-10-05: 07:00:00 1 via INTRADERMAL

## 2018-10-05 MED ORDER — PROPOFOL 10 MG/ML IV BOLUS
INTRAVENOUS | Status: DC | PRN
  Administered 2018-10-05: 100 mg via INTRAVENOUS

## 2018-10-05 MED ORDER — LIDOCAINE 2% (20 MG/ML) 5 ML SYRINGE
INTRAMUSCULAR | Status: AC
  Filled 2018-10-05: qty 5

## 2018-10-05 MED ORDER — LIDOCAINE HCL 1 % IJ SOLN
INTRAMUSCULAR | Status: AC
  Filled 2018-10-05: qty 20

## 2018-10-05 MED ORDER — SODIUM CHLORIDE 0.9 % IV SOLN
INTRAVENOUS | Status: DC | PRN
  Administered 2018-10-05: 40 ug/min via INTRAVENOUS

## 2018-10-05 MED ORDER — SUCCINYLCHOLINE CHLORIDE 200 MG/10ML IV SOSY
PREFILLED_SYRINGE | INTRAVENOUS | Status: AC
  Filled 2018-10-05: qty 10

## 2018-10-05 MED ORDER — EPHEDRINE SULFATE-NACL 50-0.9 MG/10ML-% IV SOSY
PREFILLED_SYRINGE | INTRAVENOUS | Status: DC | PRN
  Administered 2018-10-05: 5 mg via INTRAVENOUS

## 2018-10-05 MED ORDER — SODIUM CHLORIDE (PF) 0.9 % IJ SOLN
INTRAVENOUS | Status: DC | PRN
  Administered 2018-10-05: 5 mL via INTRAMUSCULAR

## 2018-10-05 MED ORDER — EPHEDRINE 5 MG/ML INJ
INTRAVENOUS | Status: AC
  Filled 2018-10-05: qty 10

## 2018-10-05 MED ORDER — LIDOCAINE 2% (20 MG/ML) 5 ML SYRINGE
INTRAMUSCULAR | Status: DC | PRN
  Administered 2018-10-05: 60 mg via INTRAVENOUS

## 2018-10-05 MED ORDER — ACETAMINOPHEN 500 MG PO TABS
1000.0000 mg | ORAL_TABLET | ORAL | Status: AC
  Administered 2018-10-05: 1000 mg via ORAL
  Filled 2018-10-05: qty 2

## 2018-10-05 MED ORDER — LACTATED RINGERS IV SOLN
INTRAVENOUS | Status: DC | PRN
  Administered 2018-10-05: 07:00:00 via INTRAVENOUS

## 2018-10-05 MED ORDER — PHENYLEPHRINE 40 MCG/ML (10ML) SYRINGE FOR IV PUSH (FOR BLOOD PRESSURE SUPPORT)
PREFILLED_SYRINGE | INTRAVENOUS | Status: AC
  Filled 2018-10-05: qty 10

## 2018-10-05 MED ORDER — PROPOFOL 10 MG/ML IV BOLUS
INTRAVENOUS | Status: AC
  Filled 2018-10-05: qty 20

## 2018-10-05 MED ORDER — ONDANSETRON HCL 4 MG/2ML IJ SOLN
INTRAMUSCULAR | Status: AC
  Filled 2018-10-05: qty 2

## 2018-10-05 MED ORDER — FENTANYL CITRATE (PF) 250 MCG/5ML IJ SOLN
INTRAMUSCULAR | Status: AC
  Filled 2018-10-05: qty 5

## 2018-10-05 MED ORDER — FENTANYL CITRATE (PF) 100 MCG/2ML IJ SOLN
INTRAMUSCULAR | Status: DC | PRN
  Administered 2018-10-05 (×2): 50 ug via INTRAVENOUS

## 2018-10-05 MED ORDER — ONDANSETRON HCL 4 MG/2ML IJ SOLN
INTRAMUSCULAR | Status: DC | PRN
  Administered 2018-10-05: 4 mg via INTRAVENOUS

## 2018-10-05 SURGICAL SUPPLY — 40 items
ADH SKN CLS APL DERMABOND .7 (GAUZE/BANDAGES/DRESSINGS) ×2
APL SKNCLS STERI-STRIP NONHPOA (GAUZE/BANDAGES/DRESSINGS)
BENZOIN TINCTURE PRP APPL 2/3 (GAUZE/BANDAGES/DRESSINGS) IMPLANT
CANISTER SUCT 3000ML PPV (MISCELLANEOUS) IMPLANT
CHLORAPREP W/TINT 10.5 ML (MISCELLANEOUS) ×4 IMPLANT
CLOSURE WOUND 1/4X4 (GAUZE/BANDAGES/DRESSINGS)
COVER PROBE W GEL 5X96 (DRAPES) ×4 IMPLANT
COVER SURGICAL LIGHT HANDLE (MISCELLANEOUS) ×4 IMPLANT
COVER WAND RF STERILE (DRAPES) ×4 IMPLANT
DECANTER SPIKE VIAL GLASS SM (MISCELLANEOUS) IMPLANT
DERMABOND ADVANCED (GAUZE/BANDAGES/DRESSINGS) ×2
DERMABOND ADVANCED .7 DNX12 (GAUZE/BANDAGES/DRESSINGS) ×2 IMPLANT
DRAPE LAPAROTOMY 100X72 PEDS (DRAPES) ×4 IMPLANT
ELECT COATED BLADE 2.86 ST (ELECTRODE) ×4 IMPLANT
ELECT REM PT RETURN 9FT ADLT (ELECTROSURGICAL) ×4
ELECTRODE REM PT RTRN 9FT ADLT (ELECTROSURGICAL) ×2 IMPLANT
GAUZE SPONGE 4X4 12PLY STRL (GAUZE/BANDAGES/DRESSINGS) IMPLANT
GLOVE SURG SYN 7.5  E (GLOVE) ×2
GLOVE SURG SYN 7.5 E (GLOVE) ×2 IMPLANT
GOWN STRL REUS W/ TWL LRG LVL3 (GOWN DISPOSABLE) ×2 IMPLANT
GOWN STRL REUS W/ TWL XL LVL3 (GOWN DISPOSABLE) ×2 IMPLANT
GOWN STRL REUS W/TWL LRG LVL3 (GOWN DISPOSABLE) ×4
GOWN STRL REUS W/TWL XL LVL3 (GOWN DISPOSABLE) ×4
KIT BASIN OR (CUSTOM PROCEDURE TRAY) ×4 IMPLANT
KIT TURNOVER KIT B (KITS) ×4 IMPLANT
NEEDLE FILTER BLUNT 18X 1/2SAF (NEEDLE) ×2
NEEDLE FILTER BLUNT 18X1 1/2 (NEEDLE) ×2 IMPLANT
NEEDLE HYPO 25GX1X1/2 BEV (NEEDLE) ×4 IMPLANT
NS IRRIG 1000ML POUR BTL (IV SOLUTION) ×4 IMPLANT
PACK GENERAL/GYN (CUSTOM PROCEDURE TRAY) ×4 IMPLANT
PAD ARMBOARD 7.5X6 YLW CONV (MISCELLANEOUS) IMPLANT
PENCIL SMOKE EVACUATOR (MISCELLANEOUS) IMPLANT
STRIP CLOSURE SKIN 1/4X4 (GAUZE/BANDAGES/DRESSINGS) IMPLANT
SUT MNCRL AB 4-0 PS2 18 (SUTURE) ×4 IMPLANT
SUT SILK 2 0 TIES 10X30 (SUTURE) ×4 IMPLANT
SUT SILK 3 0 SH 30 (SUTURE) ×4 IMPLANT
SUT VIC AB 3-0 SH 18 (SUTURE) ×4 IMPLANT
SYR CONTROL 10ML LL (SYRINGE) ×4 IMPLANT
TOWEL GREEN STERILE (TOWEL DISPOSABLE) ×4 IMPLANT
TOWEL GREEN STERILE FF (TOWEL DISPOSABLE) ×4 IMPLANT

## 2018-10-05 NOTE — Transfer of Care (Signed)
Immediate Anesthesia Transfer of Care Note  Patient: Yvonne Maynard  Procedure(s) Performed: RIGHT AXILLARY SENTINEL LYMPH NODE BIOPSY (Right Breast)  Patient Location: PACU  Anesthesia Type:General  Level of Consciousness: drowsy  Airway & Oxygen Therapy: Patient Spontanous Breathing and Patient connected to face mask oxygen  Post-op Assessment: Report given to RN and Post -op Vital signs reviewed and stable  Post vital signs: Reviewed and stable  Last Vitals:  Vitals Value Taken Time  BP 137/68 10/05/18 0832  Temp    Pulse 98 10/05/18 0832  Resp 14 10/05/18 0832  SpO2 100 % 10/05/18 0832  Vitals shown include unvalidated device data.  Last Pain:  Vitals:   10/05/18 0621  TempSrc: Oral  PainSc:       Patients Stated Pain Goal: 5 ([id number])  Complications: No apparent anesthesia complications

## 2018-10-05 NOTE — Discharge Instructions (Signed)
[facility] [place] [facility] - DISCHARGE INSTRUCTIONS TO PATIENT  Activity:  Driving - May drive in 1 to 3 days.   Lifting - No lifting more than 15 pounds for 5 days, then no limit                       Practice you Covid-19 protection:  Wear a mask, social distance, and wash your hands frequently  Wound Care:   Leave the incision dry for 2 days, then you may shower  Diet:  As tolerated  Follow up appointment:  Call Dr. Pollie Friar office [facility]) at [phone] for an appointment in 2 to 3 weeks..  Medications and dosages:  Resume your home medications.  Call Dr. Lucia Gaskins or his office  [phone]) if you have:  Temperature greater than 100.4,  Persistent nausea and vomiting,  Severe uncontrolled pain,  Redness, tenderness, or signs of infection (pain, swelling, redness, odor or green/yellow discharge around the site),  Difficulty breathing, headache or visual disturbances,  Any other questions or concerns you may have after discharge.  In an emergency, call 911 or go to an Emergency Department at a nearby hospital.

## 2018-10-05 NOTE — Anesthesia Procedure Notes (Signed)
Procedure Name: LMA Insertion Date/Time: 10/05/2018 7:32 AM Performed by: Orlie Dakin, CRNA Pre-anesthesia Checklist: Patient identified, Emergency Drugs available, Suction available and Patient being monitored Patient Re-evaluated:Patient Re-evaluated prior to induction Oxygen Delivery Method: Circle system utilized Preoxygenation: Pre-oxygenation with 100% oxygen Induction Type: IV induction LMA: LMA inserted LMA Size: 4.0 Tube type: Oral Number of attempts: 1 Placement Confirmation: positive ETCO2 Tube secured with: Tape Dental Injury: Teeth and Oropharynx as per pre-operative assessment

## 2018-10-05 NOTE — Op Note (Signed)
10/05/2018  8:27 AM  PATIENT:  Yvonne Maynard, 78 y.o., female, MRN: [id number]  PREOP DIAGNOSIS:  RIGHT BREAST CANCER  POSTOP DIAGNOSIS:   Right breast cancer  PROCEDURE:   Procedure(s): RIGHT AXILLARY SENTINEL LYMPH NODE BIOPSY  SURGEON:   Alphonsa Overall, M.D.  ASSISTANT:   NOne  ANESTHESIA:   General  Anesthesiologist: Freddrick March, MD CRNA: Orlie Dakin, CRNA  General  EBL:  minimal  ml  BLOOD ADMINISTERED: none  DRAINS: none   LOCAL MEDICATIONS USED:   22 cc of 1/4% marcaine  SPECIMEN:   Right axillary node (counts - 25, background - 0, slightly blue)  COUNTS CORRECT:  YES  INDICATIONS FOR PROCEDURE:  Yvonne Maynard is a 78 y.o. (DOB: 03/22/1940) white female whose primary care physician is Luking, Elayne Snare, MD and comes for right axillary sentinel lymph node biopsy.   She underwent a right breast lumpectomy on 09/21/2018 which showed a 0.4 cm IDC (her original biopsies showed only DCIS), so this was surprise.  She now comes for right axillary SLNBx.   The indications and risks of the surgery were explained to the patient.  The risks include, but are not limited to, infection, bleeding, and nerve injury.  PROCEDURE:  The patient was taken to room #1 at [place].  She underwent a general anesthesia.  Her right breast and axilla were prepped with chloroprep.   A time-out and the surgical check list was reviewed.    I injected about 1.0 mL of 40% methylene blue around her right areola.   I did a  right deep axillary sentinel lymph node biopsy. I made an incision in the right axilla.  I found a hot area at the junction of the breast and the pectoralis major muscle, deep in the axilla. I cut down and  identified a hot node that had counts of 25 and the background has 0 counts. The lymph node was pale blue. I checked her internal mammary nodes and supraclavicular nodes with the neoprobe and found no other hot area. The axillary node was then sent to pathology.     I then irrigated the wound with saline. I infiltrated approximately 20 mL of 1/4% Marcaine in the incision.   I then closed the wound in layers using 3-0 Vicryl sutures for the deep layer. At the skin, I closed the incision with a 4-0 Monocryl suture. The incision was then painted with Dermabond.     The patient tolerated the procedure well, was transported to the recovery room in good condition. Sponge and needle count were correct at the end of the case.   Final pathology is pending.  Alphonsa Overall, MD, [facility] Pager: [phone] Office phone:  [phone]

## 2018-10-05 NOTE — Interval H&P Note (Signed)
History and Physical Interval Note:  10/05/2018 6:53 AM  Yvonne Maynard  has presented today for surgery, with the diagnosis of RIGHT BREAST CANCER.  The various methods of treatment have been discussed with the patient and family.  Her husband is here with her.  After consideration of risks, benefits and other options for treatment, the patient has consented to  Procedure(s): [place] (Right) as a surgical intervention.  The patient's history has been reviewed, patient examined, no change in status, stable for surgery.  I have reviewed the patient's chart and labs.  Questions were answered to the patient's satisfaction.     Shann Medal

## 2018-10-05 NOTE — Anesthesia Postprocedure Evaluation (Signed)
Anesthesia Post Note  Patient: Yvonne Maynard  Procedure(s) Performed: RIGHT AXILLARY SENTINEL LYMPH NODE BIOPSY (Right Breast)     Patient location during evaluation: PACU Anesthesia Type: General Level of consciousness: awake and alert Pain management: pain level controlled Vital Signs Assessment: post-procedure vital signs reviewed and stable Respiratory status: spontaneous breathing, nonlabored ventilation, respiratory function stable and patient connected to nasal cannula oxygen Cardiovascular status: blood pressure returned to baseline and stable Postop Assessment: no apparent nausea or vomiting Anesthetic complications: no    Last Vitals:  Vitals:   10/05/18 0847 10/05/18 0856  BP: 130/74 139/84  Pulse: 85 91  Resp: 13 18  Temp:  36.5 C  SpO2: 99% 99%    Last Pain:  Vitals:   10/05/18 0856  TempSrc:   PainSc: 0-No pain                 Jessalynn Mccowan L Ceceilia Cephus

## 2018-10-05 NOTE — Anesthesia Preprocedure Evaluation (Addendum)
Anesthesia Evaluation  Patient identified by MRN, date of birth, ID band Patient awake    Reviewed: Allergy & Precautions, NPO status , Patient's Chart, lab work & pertinent test results  Airway Mallampati: III  TM Distance: >3 FB Neck ROM: Full    Dental no notable dental hx. (+) Teeth Intact, Dental Advisory Given   Pulmonary neg pulmonary ROS, asthma ,    Pulmonary exam normal breath sounds clear to auscultation       Cardiovascular negative cardio ROS Normal cardiovascular exam Rhythm:Regular Rate:Normal     Neuro/Psych Seizures -, Well Controlled,  negative neurological ROS  negative psych ROS   GI/Hepatic negative GI ROS, Neg liver ROS,   Endo/Other  negative endocrine ROS  Renal/GU negative Renal ROS  negative genitourinary   Musculoskeletal negative musculoskeletal ROS (+) Arthritis ,   Abdominal   Peds  Hematology negative hematology ROS (+)   Anesthesia Other Findings Right breast cancer  Reproductive/Obstetrics                            Anesthesia Physical Anesthesia Plan  ASA: III  Anesthesia Plan: General   Post-op Pain Management:    Induction: Intravenous  PONV Risk Score and Plan: Ondansetron, Dexamethasone and Treatment may vary due to age or medical condition  Airway Management Planned: LMA  Additional Equipment:   Intra-op Plan:   Post-operative Plan: Extubation in OR  Informed Consent: I have reviewed the patients History and Physical, chart, labs and discussed the procedure including the risks, benefits and alternatives for the proposed anesthesia with the patient or authorized representative who has indicated his/her understanding and acceptance.     Dental advisory given  Plan Discussed with: CRNA  Anesthesia Plan Comments:         Anesthesia Quick Evaluation

## 2018-10-06 ENCOUNTER — Encounter (HOSPITAL_COMMUNITY): Payer: Self-pay | Admitting: Surgery

## 2018-10-09 LAB — SURGICAL PATHOLOGY

## 2018-10-24 NOTE — Progress Notes (Signed)
Location of Breast Cancer: Right Breast  Histology per Pathology Report:  08/08/18 Diagnosis Breast, right, needle core biopsy, 11 o'clock, 4cmfn - DUCTAL CARCINOMA IN SITU.  Per pathology report there was insufficient tumor present for ER and PR receptive studies.   09/21/18 Diagnosis Breast, lumpectomy, Right w/seed - FOCUS OF INVASIVE DUCTAL CARCINOMA, 0.4 CM, ARISING IN ASSOCIATION WITH INTERMEDIATE TO HIGH-GRADE DUCTAL CARCINOMA IN SITU. SEE NOTE - RESECTION MARGINS ARE NEGATIVE FOR INVASIVE CARCINOMA - DCIS IS 2.5 MM FROM INFERIOR MARGIN AND 5 MM FROM THE POSTERIOR MARGIN - LYMPHOVASCULAR INVASION IS PRESENT - BIOPSY SITE CHANGES  Receptor Status: ER (NEG), PR (NEG), Her 2 (POS), Ki- (40%).   10/05/18 DIAGNOSIS: A. LYMPH NODE,RIGHT AXILLARY SENTINEL, BIOPSY: -There is no evidence of carcinoma in 1 of 1 lymph node (0/1). B. LYMPH NODE, RIGHT AXILLARY, SENTINEL, BIOPSY: - There is no evidence of carcinoma in 1 of 1 lymph node (0/1).   Did patient present with symptoms or was this found on screening mammography?: It was found on a screening mammogram.   Past/Anticipated interventions by surgeon, if any: 09/21/18 PROCEDURE:   Procedure(s):    RIGHT BREAST LUMPECTOMY WITH RADIOACTIVE SEED LOCALIZATION SURGEON:   Yvonne Maynard, M.D.  10/05/18 PROCEDURE:   Procedure(s): RIGHT AXILLARY SENTINEL LYMPH NODE BIOPSY SURGEON:   Yvonne Maynard, M.D.   Past/Anticipated interventions by medical oncology, if any: 09/28/18 Dr. Lindi Maynard Treatment plan: We discussed the pros and cons of sentinel lymph node surgery.  I informed her that the likelihood of having a positive lymph node is going to be very low if her breast prognostic panel is favorable.  However she wants to undergo this surgery because she is extremely healthy 78 year old.  So I will send a message to Dr. Lucia Maynard to proceed with planning for the sentinel lymph node surgery.  1.  Adjuvant radiation therapy followed by 2.   Adjuvant antiestrogen therapy with anastrozole for 5 years if estrogen receptor is positive Return to clinic after radiation therapy is complete  Lymphedema issues, if any: She denies. She has good arm mobility.   Pain issues, if any:  She denies.   SAFETY ISSUES:  Prior radiation? No  Pacemaker/ICD? No  Possible current pregnancy? No  Is the patient on methotrexate? No  Current Complaints / other details:    BP 139/70 (BP Location: Left Arm, Patient Position: Sitting)   Pulse 64   Temp 98.2 F (36.8 C) (Temporal)   Resp 18   Ht 5\' 3"  (1.6 m)   Wt 133 lb 6 oz (60.5 kg)   SpO2 99%   BMI 23.63 kg/m

## 2018-10-29 ENCOUNTER — Ambulatory Visit: Payer: PPO | Admitting: Radiation Oncology

## 2018-10-29 NOTE — Progress Notes (Signed)
Radiation Oncology         [phone] ________________________________  Name: Yvonne Maynard MRN: [id number]  Date: 10/30/2018  DOB: 11-09-1940  Follow-Up Visit Note  Outpatient  CC: Kathyrn Drown, MD  Kathyrn Drown, MD  Diagnosis:      ICD-10-CM   1. Malignant neoplasm of upper-outer quadrant of right female breast, unspecified estrogen receptor status ([place])  C50.411      Cancer Staging Malignant neoplasm of upper-outer quadrant of right female breast [facility]) Staging form: Breast, AJCC 8th Edition - Clinical stage from 08/08/2018: Stage 0 (cTis (DCIS), cN0, cM0, ER: Unknown, PR: Unknown, HER2: Not Assessed) - Signed by Nicholas Lose, MD on 09/04/2018 - Pathologic stage from 09/21/2018: Stage IA (pT1a, pN0, cM0, G1, ER-, PR-, HER2+) - Signed by Gardenia Phlegm, NP on 10/10/2018   CHIEF COMPLAINT: Here to discuss management of right breast cancer  Narrative:  The patient returns today for follow-up to discuss radiation treatment options. She was seen in consultation on 08/29/2018.     She opted to proceed with right lumpectomy on date of 09/21/2018 with pathology report revealing: tumor size of 0.4; histology of invasive ductal carcinoma; margin status to invasive disease of 12 mm and margin status to in situ disease of 2.5 mm; ER status: negative; PR status negative; Her2 positive by FISH; Grade 1.  SLN bx on 9-18 revealed 2 negative nodes.  Symptomatically, the patient reports:  Doing well, healing well. Good arm mobility, no pain. No plan for chemotherapy per medical oncology.         ALLERGIES:  has No Known Allergies.  Meds: Current Outpatient Medications  Medication Sig Dispense Refill  . albuterol (PROVENTIL HFA;VENTOLIN HFA) 108 (90 BASE) MCG/ACT inhaler Inhale 2 puffs into the lungs 2 (two) times daily as needed for wheezing or shortness of breath. 1 Inhaler 0  . aspirin EC 81 MG tablet Take 81 mg by mouth at bedtime.     . Calcium Carbonate-Vitamin D  (CALTRATE 600+D PO) Take 1 tablet by mouth 2 (two) times daily.    . fexofenadine (ALLEGRA) 180 MG tablet Take 180 mg by mouth daily.     . fluticasone (FLONASE) 50 MCG/ACT nasal spray USE 2 SPRAYS IN EACH NOSTRIL ONCE DAILY. (Patient taking differently: Place 1 spray into both nostrils at bedtime. ) 16 g 3  . hydroxypropyl methylcellulose / hypromellose (ISOPTO TEARS / GONIOVISC) 2.5 % ophthalmic solution Place 1 drop into both eyes 3 (three) times daily as needed for dry eyes.    . montelukast (SINGULAIR) 10 MG tablet TAKE ONE TABLET BY MOUTH ONCE DAILY. (Patient taking differently: Take 10 mg by mouth at bedtime. ) 30 tablet 2  . Multiple Vitamin (MULTIVITAMIN WITH MINERALS) TABS tablet Take 1 tablet by mouth daily. Centrum Silver    . Multiple Vitamins-Minerals (PRESERVISION AREDS 2 PO) Take 1 tablet by mouth 2 (two) times daily.    . Omega-3 Fatty Acids (FISH OIL) 1000 MG CAPS Take 1,000 mg by mouth daily.    . vitamin B-12 (CYANOCOBALAMIN) 1000 MCG tablet Take 1,000 mcg by mouth daily.    . traMADol (ULTRAM) 50 MG tablet Take 1 tablet (50 mg total) by mouth every 6 (six) hours as needed for moderate pain or severe pain. (Patient not taking: Reported on 10/02/2018) 10 tablet 0   No current facility-administered medications for this encounter.     Physical Findings:  height is '5\' 3"'  (1.6 m) and weight is 133 lb 6 oz (  60.5 kg). Her temporal temperature is 98.2 F (36.8 C). Her blood pressure is 139/70 and her pulse is 64. Her respiration is 18 and oxygen saturation is 99%. .     General: Alert and oriented, in no acute distress Musculoskeletal: symmetric strength and muscle tone throughout. Ambulatory Neurologic: No obvious focalities. Speech is fluent.  Psychiatric: Judgment and insight are intact. Affect is appropriate. Breast exam reveals excellent healing of right axillary and lumpectomy sites Skin- no rashes over right breast  Lab Findings: Lab Results  Component Value Date   WBC  6.5 12/18/2017   HGB 14.4 10/05/2018   HCT 45.0 12/18/2017   MCV 89 12/18/2017   PLT 557 (H) 12/18/2017    '@LASTCHEMISTRY' @  Radiographic Findings: Nm Sentinel Node Inj-no Rpt (breast)  Result Date: 10/05/2018 Sulfur colloid was injected by the nuclear medicine technologist for melanoma sentinel node.    Impression/Plan: Right Breast Cancer  We discussed adjuvant radiotherapy today.  I recommend 4 weeks directed at the right breast in order to reduce risk of locoregional recurrence by 2/3.  The risks, benefits and side effects of this treatment were discussed in detail.  She understands that radiotherapy is associated with skin irritation and fatigue in the acute setting. Late effects can include cosmetic changes and rare injury to internal organs.  She is enthusiastic about proceeding with treatment. A consent form has been signed and placed in her chart.   Simulation to follow this morning.  _____________________________________   Eppie Gibson, MD   This document serves as a record of services personally performed by Eppie Gibson, MD. It was created on her behalf by Wilburn Mylar, a trained medical scribe. The creation of this record is based on the scribe's personal observations and the provider's statements to them. This document has been checked and approved by the attending provider.

## 2018-10-30 ENCOUNTER — Encounter: Payer: Self-pay | Admitting: Radiation Oncology

## 2018-10-30 ENCOUNTER — Ambulatory Visit
Admission: RE | Admit: 2018-10-30 | Discharge: 2018-10-30 | Disposition: A | Discharge status: Home / Self Care | Payer: PPO | Source: Ambulatory Visit | Attending: Radiation Oncology | Admitting: Radiation Oncology

## 2018-10-30 ENCOUNTER — Other Ambulatory Visit: Payer: Self-pay

## 2018-10-30 DIAGNOSIS — Z171 Estrogen receptor negative status [ER-]: Secondary | ICD-10-CM | POA: Diagnosis not present

## 2018-10-30 DIAGNOSIS — Z7982 Long term (current) use of aspirin: Secondary | ICD-10-CM | POA: Insufficient documentation

## 2018-10-30 DIAGNOSIS — Z79899 Other long term (current) drug therapy: Secondary | ICD-10-CM | POA: Insufficient documentation

## 2018-10-30 DIAGNOSIS — Z510 Encounter for antineoplastic radiation therapy: Secondary | ICD-10-CM | POA: Diagnosis not present

## 2018-10-30 DIAGNOSIS — C50411 Malignant neoplasm of upper-outer quadrant of right female breast: Secondary | ICD-10-CM

## 2018-10-30 DIAGNOSIS — Z9889 Other specified postprocedural states: Secondary | ICD-10-CM | POA: Diagnosis not present

## 2018-10-30 NOTE — Progress Notes (Signed)
  Radiation Oncology         [phone] ________________________________  Name: Yvonne Maynard MRN: [id number]  Date: 10/30/2018  DOB: 04/06/1940  SIMULATION AND TREATMENT PLANNING NOTE    Outpatient  DIAGNOSIS:     ICD-10-CM   1. Malignant neoplasm of upper-outer quadrant of right breast in female, estrogen receptor negative ([place])  C50.411    Z17.1     NARRATIVE:  The patient was brought to the [facility].  Identity was confirmed.  All relevant records and images related to the planned course of therapy were reviewed.  The patient freely provided informed written consent to proceed with treatment after reviewing the details related to the planned course of therapy. The consent form was witnessed and verified by the simulation staff.    Then, the patient was set-up in a stable reproducible supine position for radiation therapy with her ipsilateral arm over her head, and her upper body secured in a custom-made Vac-lok device.  CT images were obtained.  Surface markings were placed.  The CT images were loaded into the planning software.    TREATMENT PLANNING NOTE: Treatment planning then occurred.  The radiation prescription was entered and confirmed.     A total of 3 medically necessary complex treatment devices were fabricated and supervised by me: 2 fields with MLCs for custom blocks to protect heart, and lungs;  and, a Vac-lok. MORE COMPLEX DEVICES MAY BE MADE IN DOSIMETRY FOR FIELD IN FIELD BEAMS FOR DOSE HOMOGENEITY.  I have requested : 3D Simulation which is medically necessary to give adequate dose to at risk tissues while sparing lungs and heart.  I have requested a DVH of the following structures: lungs, heart, right lumpectomy cavity.    The patient will receive 40.05 Gy in 15 fractions to the right breast with 2 tangential fields.   This will be followed by a boost.  Optical Surface Tracking Plan:  Since intensity modulated radiotherapy (IMRT) and 3D  conformal radiation treatment methods are predicated on accurate and precise positioning for treatment, intrafraction motion monitoring is medically necessary to ensure accurate and safe treatment delivery. The ability to quantify intrafraction motion without excessive ionizing radiation dose can only be performed with optical surface tracking. Accordingly, surface imaging offers the opportunity to obtain 3D measurements of patient position throughout IMRT and 3D treatments without excessive radiation exposure. I am ordering optical surface tracking for this patient's upcoming course of radiotherapy.  ________________________________   Reference:  Ursula Alert, J, et al. Surface imaging-based analysis of intrafraction motion for breast radiotherapy patients.Journal of [place], n. 6, nov. 2014. ISSN [id number].  Available at: <http://www.jacmp.org/index.php/jacmp/article/view/4957>.    -----------------------------------  Eppie Gibson, MD

## 2018-11-01 ENCOUNTER — Encounter: Payer: Self-pay | Admitting: Radiation Oncology

## 2018-11-02 DIAGNOSIS — Z510 Encounter for antineoplastic radiation therapy: Secondary | ICD-10-CM | POA: Diagnosis not present

## 2018-11-02 DIAGNOSIS — C50411 Malignant neoplasm of upper-outer quadrant of right female breast: Secondary | ICD-10-CM | POA: Diagnosis not present

## 2018-11-05 ENCOUNTER — Other Ambulatory Visit: Payer: Self-pay

## 2018-11-05 ENCOUNTER — Ambulatory Visit
Admission: RE | Admit: 2018-11-05 | Discharge: 2018-11-05 | Disposition: A | Discharge status: Home / Self Care | Payer: PPO | Source: Ambulatory Visit | Attending: Radiation Oncology | Admitting: Radiation Oncology

## 2018-11-05 ENCOUNTER — Encounter: Payer: Self-pay | Admitting: *Deleted

## 2018-11-05 DIAGNOSIS — C50411 Malignant neoplasm of upper-outer quadrant of right female breast: Secondary | ICD-10-CM | POA: Diagnosis not present

## 2018-11-05 DIAGNOSIS — Z510 Encounter for antineoplastic radiation therapy: Secondary | ICD-10-CM | POA: Diagnosis not present

## 2018-11-05 DIAGNOSIS — Z171 Estrogen receptor negative status [ER-]: Secondary | ICD-10-CM

## 2018-11-05 MED ORDER — ALRA NON-METALLIC DEODORANT (RAD-ONC)
1.0000 "application " | Freq: Once | TOPICAL | Status: AC
  Administered 2018-11-05: 1 via TOPICAL

## 2018-11-05 MED ORDER — RADIAPLEXRX EX GEL
Freq: Once | CUTANEOUS | Status: AC
  Administered 2018-11-05: 16:00:00 via TOPICAL

## 2018-11-06 ENCOUNTER — Other Ambulatory Visit: Payer: Self-pay

## 2018-11-06 ENCOUNTER — Ambulatory Visit
Admission: RE | Admit: 2018-11-06 | Discharge: 2018-11-06 | Disposition: A | Discharge status: Home / Self Care | Payer: PPO | Source: Ambulatory Visit | Attending: Radiation Oncology | Admitting: Radiation Oncology

## 2018-11-06 DIAGNOSIS — Z510 Encounter for antineoplastic radiation therapy: Secondary | ICD-10-CM | POA: Diagnosis not present

## 2018-11-06 DIAGNOSIS — C50411 Malignant neoplasm of upper-outer quadrant of right female breast: Secondary | ICD-10-CM | POA: Diagnosis not present

## 2018-11-07 ENCOUNTER — Ambulatory Visit
Admission: RE | Admit: 2018-11-07 | Discharge: 2018-11-07 | Disposition: A | Discharge status: Home / Self Care | Payer: PPO | Source: Ambulatory Visit | Attending: Radiation Oncology | Admitting: Radiation Oncology

## 2018-11-07 ENCOUNTER — Other Ambulatory Visit: Payer: Self-pay

## 2018-11-07 DIAGNOSIS — Z510 Encounter for antineoplastic radiation therapy: Secondary | ICD-10-CM | POA: Diagnosis not present

## 2018-11-07 DIAGNOSIS — C50411 Malignant neoplasm of upper-outer quadrant of right female breast: Secondary | ICD-10-CM | POA: Diagnosis not present

## 2018-11-08 ENCOUNTER — Ambulatory Visit
Admission: RE | Admit: 2018-11-08 | Discharge: 2018-11-08 | Disposition: A | Discharge status: Home / Self Care | Payer: PPO | Source: Ambulatory Visit | Attending: Radiation Oncology | Admitting: Radiation Oncology

## 2018-11-08 ENCOUNTER — Other Ambulatory Visit: Payer: Self-pay

## 2018-11-08 DIAGNOSIS — C50411 Malignant neoplasm of upper-outer quadrant of right female breast: Secondary | ICD-10-CM | POA: Diagnosis not present

## 2018-11-08 DIAGNOSIS — Z510 Encounter for antineoplastic radiation therapy: Secondary | ICD-10-CM | POA: Diagnosis not present

## 2018-11-09 ENCOUNTER — Ambulatory Visit
Admission: RE | Admit: 2018-11-09 | Discharge: 2018-11-09 | Disposition: A | Discharge status: Home / Self Care | Payer: PPO | Source: Ambulatory Visit | Attending: Radiation Oncology | Admitting: Radiation Oncology

## 2018-11-09 ENCOUNTER — Other Ambulatory Visit: Payer: Self-pay

## 2018-11-09 DIAGNOSIS — Z510 Encounter for antineoplastic radiation therapy: Secondary | ICD-10-CM | POA: Diagnosis not present

## 2018-11-09 DIAGNOSIS — C50411 Malignant neoplasm of upper-outer quadrant of right female breast: Secondary | ICD-10-CM | POA: Diagnosis not present

## 2018-11-12 ENCOUNTER — Ambulatory Visit
Admission: RE | Admit: 2018-11-12 | Discharge: 2018-11-12 | Disposition: A | Discharge status: Home / Self Care | Payer: PPO | Source: Ambulatory Visit | Attending: Radiation Oncology | Admitting: Radiation Oncology

## 2018-11-12 ENCOUNTER — Other Ambulatory Visit: Payer: Self-pay

## 2018-11-12 DIAGNOSIS — C50411 Malignant neoplasm of upper-outer quadrant of right female breast: Secondary | ICD-10-CM | POA: Diagnosis not present

## 2018-11-12 DIAGNOSIS — Z510 Encounter for antineoplastic radiation therapy: Secondary | ICD-10-CM | POA: Diagnosis not present

## 2018-11-13 ENCOUNTER — Other Ambulatory Visit: Payer: Self-pay

## 2018-11-13 ENCOUNTER — Ambulatory Visit
Admission: RE | Admit: 2018-11-13 | Discharge: 2018-11-13 | Disposition: A | Discharge status: Home / Self Care | Payer: PPO | Source: Ambulatory Visit | Attending: Radiation Oncology | Admitting: Radiation Oncology

## 2018-11-13 DIAGNOSIS — C50411 Malignant neoplasm of upper-outer quadrant of right female breast: Secondary | ICD-10-CM | POA: Diagnosis not present

## 2018-11-13 DIAGNOSIS — Z510 Encounter for antineoplastic radiation therapy: Secondary | ICD-10-CM | POA: Diagnosis not present

## 2018-11-14 ENCOUNTER — Ambulatory Visit
Admission: RE | Admit: 2018-11-14 | Discharge: 2018-11-14 | Disposition: A | Discharge status: Home / Self Care | Payer: PPO | Source: Ambulatory Visit | Attending: Radiation Oncology | Admitting: Radiation Oncology

## 2018-11-14 ENCOUNTER — Other Ambulatory Visit: Payer: Self-pay

## 2018-11-14 DIAGNOSIS — C50411 Malignant neoplasm of upper-outer quadrant of right female breast: Secondary | ICD-10-CM | POA: Diagnosis not present

## 2018-11-14 DIAGNOSIS — Z510 Encounter for antineoplastic radiation therapy: Secondary | ICD-10-CM | POA: Diagnosis not present

## 2018-11-15 ENCOUNTER — Ambulatory Visit
Admission: RE | Admit: 2018-11-15 | Discharge: 2018-11-15 | Disposition: A | Discharge status: Home / Self Care | Payer: PPO | Source: Ambulatory Visit | Attending: Radiation Oncology | Admitting: Radiation Oncology

## 2018-11-15 ENCOUNTER — Other Ambulatory Visit: Payer: Self-pay

## 2018-11-15 DIAGNOSIS — C50411 Malignant neoplasm of upper-outer quadrant of right female breast: Secondary | ICD-10-CM | POA: Diagnosis not present

## 2018-11-15 DIAGNOSIS — Z510 Encounter for antineoplastic radiation therapy: Secondary | ICD-10-CM | POA: Diagnosis not present

## 2018-11-16 ENCOUNTER — Other Ambulatory Visit: Payer: Self-pay

## 2018-11-16 ENCOUNTER — Ambulatory Visit
Admission: RE | Admit: 2018-11-16 | Discharge: 2018-11-16 | Disposition: A | Discharge status: Home / Self Care | Payer: PPO | Source: Ambulatory Visit | Attending: Radiation Oncology | Admitting: Radiation Oncology

## 2018-11-16 DIAGNOSIS — C50411 Malignant neoplasm of upper-outer quadrant of right female breast: Secondary | ICD-10-CM | POA: Diagnosis not present

## 2018-11-16 DIAGNOSIS — Z510 Encounter for antineoplastic radiation therapy: Secondary | ICD-10-CM | POA: Diagnosis not present

## 2018-11-19 ENCOUNTER — Ambulatory Visit
Admission: RE | Admit: 2018-11-19 | Discharge: 2018-11-19 | Disposition: A | Discharge status: Home / Self Care | Payer: PPO | Source: Ambulatory Visit | Attending: Radiation Oncology | Admitting: Radiation Oncology

## 2018-11-19 ENCOUNTER — Other Ambulatory Visit: Payer: Self-pay

## 2018-11-19 DIAGNOSIS — Z510 Encounter for antineoplastic radiation therapy: Secondary | ICD-10-CM | POA: Diagnosis not present

## 2018-11-19 DIAGNOSIS — C50411 Malignant neoplasm of upper-outer quadrant of right female breast: Secondary | ICD-10-CM | POA: Insufficient documentation

## 2018-11-19 DIAGNOSIS — Z171 Estrogen receptor negative status [ER-]: Secondary | ICD-10-CM | POA: Diagnosis not present

## 2018-11-20 ENCOUNTER — Other Ambulatory Visit: Payer: Self-pay

## 2018-11-20 ENCOUNTER — Ambulatory Visit
Admission: RE | Admit: 2018-11-20 | Discharge: 2018-11-20 | Disposition: A | Discharge status: Home / Self Care | Payer: PPO | Source: Ambulatory Visit | Attending: Radiation Oncology | Admitting: Radiation Oncology

## 2018-11-20 DIAGNOSIS — C50411 Malignant neoplasm of upper-outer quadrant of right female breast: Secondary | ICD-10-CM | POA: Diagnosis not present

## 2018-11-21 ENCOUNTER — Other Ambulatory Visit: Payer: Self-pay

## 2018-11-21 ENCOUNTER — Ambulatory Visit
Admission: RE | Admit: 2018-11-21 | Discharge: 2018-11-21 | Disposition: A | Discharge status: Home / Self Care | Payer: PPO | Source: Ambulatory Visit | Attending: Radiation Oncology | Admitting: Radiation Oncology

## 2018-11-21 DIAGNOSIS — C50411 Malignant neoplasm of upper-outer quadrant of right female breast: Secondary | ICD-10-CM | POA: Diagnosis not present

## 2018-11-22 ENCOUNTER — Telehealth: Payer: Self-pay

## 2018-11-22 ENCOUNTER — Ambulatory Visit
Admission: RE | Admit: 2018-11-22 | Discharge: 2018-11-22 | Disposition: A | Discharge status: Home / Self Care | Payer: PPO | Source: Ambulatory Visit | Attending: Radiation Oncology | Admitting: Radiation Oncology

## 2018-11-22 ENCOUNTER — Other Ambulatory Visit: Payer: Self-pay

## 2018-11-22 DIAGNOSIS — C50411 Malignant neoplasm of upper-outer quadrant of right female breast: Secondary | ICD-10-CM | POA: Diagnosis not present

## 2018-11-22 NOTE — Telephone Encounter (Signed)
I received a phone call from Yvonne Maynard this afternoon. She reported that she developed a fever of 100.5 and chills this afternoon after receiving her radiation treatment this morning. She denies other symptoms at this time. I advised her that we would cancel her radiation appointment tomorrow. She should monitor her symptoms over the next several days and call her PCP to advise them of her fever. She will call me if other symptoms develop and if she plans to cancel her radiation appointment for Monday.

## 2018-11-23 ENCOUNTER — Ambulatory Visit: Payer: PPO

## 2018-11-23 ENCOUNTER — Other Ambulatory Visit: Payer: Self-pay | Admitting: *Deleted

## 2018-11-23 DIAGNOSIS — Z20822 Contact with and (suspected) exposure to covid-19: Secondary | ICD-10-CM

## 2018-11-24 LAB — NOVEL CORONAVIRUS, NAA: SARS-CoV-2, NAA: NOT DETECTED

## 2018-11-26 ENCOUNTER — Ambulatory Visit: Payer: PPO | Admitting: Radiation Oncology

## 2018-11-26 ENCOUNTER — Ambulatory Visit
Admission: RE | Admit: 2018-11-26 | Discharge: 2018-11-26 | Disposition: A | Discharge status: Home / Self Care | Payer: PPO | Source: Ambulatory Visit | Attending: Radiation Oncology | Admitting: Radiation Oncology

## 2018-11-26 ENCOUNTER — Other Ambulatory Visit: Payer: Self-pay

## 2018-11-26 DIAGNOSIS — C50411 Malignant neoplasm of upper-outer quadrant of right female breast: Secondary | ICD-10-CM | POA: Diagnosis not present

## 2018-11-26 DIAGNOSIS — Z171 Estrogen receptor negative status [ER-]: Secondary | ICD-10-CM

## 2018-11-26 MED ORDER — RADIAPLEXRX EX GEL
Freq: Once | CUTANEOUS | Status: AC
  Administered 2018-11-26: 14:00:00 via TOPICAL

## 2018-11-27 ENCOUNTER — Ambulatory Visit: Payer: PPO

## 2018-11-27 ENCOUNTER — Other Ambulatory Visit: Payer: Self-pay

## 2018-11-27 ENCOUNTER — Ambulatory Visit
Admission: RE | Admit: 2018-11-27 | Discharge: 2018-11-27 | Disposition: A | Discharge status: Home / Self Care | Payer: PPO | Source: Ambulatory Visit | Attending: Radiation Oncology | Admitting: Radiation Oncology

## 2018-11-27 DIAGNOSIS — C50411 Malignant neoplasm of upper-outer quadrant of right female breast: Secondary | ICD-10-CM | POA: Diagnosis not present

## 2018-11-28 ENCOUNTER — Other Ambulatory Visit: Payer: Self-pay

## 2018-11-28 ENCOUNTER — Ambulatory Visit
Admission: RE | Admit: 2018-11-28 | Discharge: 2018-11-28 | Disposition: A | Discharge status: Home / Self Care | Payer: PPO | Source: Ambulatory Visit | Attending: Radiation Oncology | Admitting: Radiation Oncology

## 2018-11-28 DIAGNOSIS — C50411 Malignant neoplasm of upper-outer quadrant of right female breast: Secondary | ICD-10-CM | POA: Diagnosis not present

## 2018-11-29 ENCOUNTER — Other Ambulatory Visit: Payer: Self-pay

## 2018-11-29 ENCOUNTER — Ambulatory Visit
Admission: RE | Admit: 2018-11-29 | Discharge: 2018-11-29 | Disposition: A | Discharge status: Home / Self Care | Payer: PPO | Source: Ambulatory Visit | Attending: Radiation Oncology | Admitting: Radiation Oncology

## 2018-11-29 DIAGNOSIS — C50411 Malignant neoplasm of upper-outer quadrant of right female breast: Secondary | ICD-10-CM | POA: Diagnosis not present

## 2018-11-30 ENCOUNTER — Other Ambulatory Visit: Payer: Self-pay

## 2018-11-30 ENCOUNTER — Ambulatory Visit
Admission: RE | Admit: 2018-11-30 | Discharge: 2018-11-30 | Disposition: A | Discharge status: Home / Self Care | Payer: PPO | Source: Ambulatory Visit | Attending: Radiation Oncology | Admitting: Radiation Oncology

## 2018-11-30 ENCOUNTER — Ambulatory Visit: Payer: PPO

## 2018-11-30 DIAGNOSIS — C50411 Malignant neoplasm of upper-outer quadrant of right female breast: Secondary | ICD-10-CM | POA: Diagnosis not present

## 2018-12-03 ENCOUNTER — Ambulatory Visit
Admission: RE | Admit: 2018-12-03 | Discharge: 2018-12-03 | Disposition: A | Discharge status: Home / Self Care | Payer: PPO | Source: Ambulatory Visit | Attending: Radiation Oncology | Admitting: Radiation Oncology

## 2018-12-03 ENCOUNTER — Telehealth: Payer: Self-pay | Admitting: Hematology and Oncology

## 2018-12-03 ENCOUNTER — Other Ambulatory Visit: Payer: Self-pay

## 2018-12-03 ENCOUNTER — Encounter: Payer: Self-pay | Admitting: *Deleted

## 2018-12-03 ENCOUNTER — Encounter: Payer: Self-pay | Admitting: Radiation Oncology

## 2018-12-03 DIAGNOSIS — C50411 Malignant neoplasm of upper-outer quadrant of right female breast: Secondary | ICD-10-CM | POA: Diagnosis not present

## 2018-12-03 NOTE — Progress Notes (Signed)
Patient Care Team: Kathyrn Drown, MD as PCP - General (Family Medicine) Nicholas Lose, MD as Consulting Physician (Hematology and Oncology) Eppie Gibson, MD as Attending Physician (Radiation Oncology)  DIAGNOSIS:    ICD-10-CM   1. Malignant neoplasm of upper-outer quadrant of right breast in female, estrogen receptor negative ([place])  C50.411    Z17.1     SUMMARY OF ONCOLOGIC HISTORY: Oncology History  Malignant neoplasm of upper-outer quadrant of right female breast ([place])  08/08/2018 Cancer Staging   Staging form: Breast, AJCC 8th Edition - Clinical stage from 08/08/2018: Stage 0 (cTis (DCIS), cN0, cM0, ER: Unknown, PR: Unknown, HER2: Not Assessed) - Signed by Nicholas Lose, MD on 09/04/2018   08/15/2018 Initial Diagnosis   Routine screening mammogram detected a 0.6cm mass in the right breast, 5cm from the nipple with no axillary adenopathy. Biopsy showed high grade DCIS, with insufficient tumor for receptor studies.   09/20/2018 Surgery   Right lumpectomy on 09/20/18 Lucia Gaskins): intermediate to high grade DCIS with a 0.4cm focus of invasive carcinoma, lymphovascular invasion present, clear margins.   09/21/2018 Cancer Staging   Staging form: Breast, AJCC 8th Edition - Pathologic stage from 09/21/2018: Stage IA (pT1a, pN0, cM0, G1, ER-, PR-, HER2+) - Signed by Gardenia Phlegm, NP on 10/10/2018   10/05/2018 Surgery   Right axillary sentinel lymph node biopsy: 2 lymph nodes negative for carcinoma.    11/06/2018 - 12/03/2018 Radiation Therapy   Adjuvant radiation     CHIEF COMPLIANT: Follow-up to discuss further treatment  INTERVAL HISTORY: Yvonne Maynard is a 78 y.o. with above-mentioned history of right breast cancer who underwent a lumpectomy. She underwent a right axillary sentinel lymph node biopsy on 10/05/18 with Dr. Lucia Gaskins for which pathology showed no evidence of carcinoma in in 2 lymph nodes. She completed radiation therapy on 12/03/18. She presents to the clinic today to  discuss further treatment.   REVIEW OF SYSTEMS:   Constitutional: Denies fevers, chills or abnormal weight loss Eyes: Denies blurriness of vision Ears, nose, mouth, throat, and face: Denies mucositis or sore throat Respiratory: Denies cough, dyspnea or wheezes Cardiovascular: Denies palpitation, chest discomfort Gastrointestinal: Denies nausea, heartburn or change in bowel habits Skin: Denies abnormal skin rashes Lymphatics: Denies new lymphadenopathy or easy bruising Neurological: Denies numbness, tingling or new weaknesses Behavioral/Psych: Mood is stable, no new changes  Extremities: No lower extremity edema Breast: denies any pain or lumps or nodules in either breasts All other systems were reviewed with the patient and are negative.  I have reviewed the past medical history, past surgical history, social history and family history with the patient and they are unchanged from previous note.  ALLERGIES:  has No Known Allergies.  MEDICATIONS:  Current Outpatient Medications  Medication Sig Dispense Refill   albuterol (PROVENTIL HFA;VENTOLIN HFA) 108 (90 BASE) MCG/ACT inhaler Inhale 2 puffs into the lungs 2 (two) times daily as needed for wheezing or shortness of breath. 1 Inhaler 0   aspirin EC 81 MG tablet Take 81 mg by mouth at bedtime.      Calcium Carbonate-Vitamin D (CALTRATE 600+D PO) Take 1 tablet by mouth 2 (two) times daily.     fexofenadine (ALLEGRA) 180 MG tablet Take 180 mg by mouth daily.      fluticasone (FLONASE) 50 MCG/ACT nasal spray USE 2 SPRAYS IN EACH NOSTRIL ONCE DAILY. (Patient taking differently: Place 1 spray into both nostrils at bedtime. ) 16 g 3   hydroxypropyl methylcellulose / hypromellose (ISOPTO TEARS / GONIOVISC)  2.5 % ophthalmic solution Place 1 drop into both eyes 3 (three) times daily as needed for dry eyes.     montelukast (SINGULAIR) 10 MG tablet TAKE ONE TABLET BY MOUTH ONCE DAILY. (Patient taking differently: Take 10 mg by mouth at  bedtime. ) 30 tablet 2   Multiple Vitamin (MULTIVITAMIN WITH MINERALS) TABS tablet Take 1 tablet by mouth daily. Centrum Silver     Multiple Vitamins-Minerals (PRESERVISION AREDS 2 PO) Take 1 tablet by mouth 2 (two) times daily.     Omega-3 Fatty Acids (FISH OIL) 1000 MG CAPS Take 1,000 mg by mouth daily.     traMADol (ULTRAM) 50 MG tablet Take 1 tablet (50 mg total) by mouth every 6 (six) hours as needed for moderate pain or severe pain. (Patient not taking: Reported on 10/02/2018) 10 tablet 0   vitamin B-12 (CYANOCOBALAMIN) 1000 MCG tablet Take 1,000 mcg by mouth daily.     No current facility-administered medications for this visit.     PHYSICAL EXAMINATION: ECOG PERFORMANCE STATUS: 1 - Symptomatic but completely ambulatory  Vitals:   12/04/18 0925  BP: 136/70  Pulse: 73  Resp: 17  Temp: 98.5 F (36.9 C)  SpO2: 100%   Filed Weights   12/04/18 0925  Weight: 133 lb 9.6 oz (60.6 kg)    GENERAL: alert, no distress and comfortable SKIN: skin color, texture, turgor are normal, no rashes or significant lesions EYES: normal, Conjunctiva are pink and non-injected, sclera clear OROPHARYNX: no exudate, no erythema and lips, buccal mucosa, and tongue normal  NECK: supple, thyroid normal size, non-tender, without nodularity LYMPH: no palpable lymphadenopathy in the cervical, axillary or inguinal LUNGS: clear to auscultation and percussion with normal breathing effort HEART: regular rate & rhythm and no murmurs and no lower extremity edema ABDOMEN: abdomen soft, non-tender and normal bowel sounds MUSCULOSKELETAL: no cyanosis of digits and no clubbing  NEURO: alert & oriented x 3 with fluent speech, no focal motor/sensory deficits EXTREMITIES: No lower extremity edema  LABORATORY DATA:  I have reviewed the data as listed CMP Latest Ref Rng & Units 12/18/2017 12/23/2016 12/22/2015  Glucose 65 - 99 mg/dL 95 101(H) 97  BUN 8 - 27 mg/dL '11 13 15  ' Creatinine 0.57 - 1.00 mg/dL 0.78 0.88  0.76  Sodium 134 - 144 mmol/L 145(H) 143 142  Potassium 3.5 - 5.2 mmol/L 5.0 4.7 5.0  Chloride 96 - 106 mmol/L 105 105 103  CO2 20 - 29 mmol/L '24 26 27  ' Calcium 8.7 - 10.3 mg/dL 9.6 9.7 9.6  Total Protein 6.0 - 8.5 g/dL - 6.8 6.8  Total Bilirubin 0.0 - 1.2 mg/dL - 0.3 0.4  Alkaline Phos 39 - 117 IU/L - 74 68  AST 0 - 40 IU/L - 20 19  ALT 0 - 32 IU/L - 20 20    Lab Results  Component Value Date   WBC 6.5 12/18/2017   HGB 14.4 10/05/2018   HCT 45.0 12/18/2017   MCV 89 12/18/2017   PLT 557 (H) 12/18/2017   NEUTROABS 3.8 12/18/2017    ASSESSMENT & PLAN:  Malignant neoplasm of upper-outer quadrant of right female breast ([place]) 09/20/2018:Right lumpectomy on 09/20/18 Lucia Gaskins): intermediate to high grade DCIS with a 0.4cm focus of invasive carcinoma, lymphovascular invasion present, clear margins. Stage Ia: ER/PR negative HER-2 positive 10/05/2018: Sentinel lymph node biopsy: 0/2 lymph nodes negative 11/06/2018- 12/03/2018: Adjuvant radiation  Treatment plan: No role of adjuvant systemic chemotherapy or antiestrogen therapy because of the size of the tumor and  the fact that it is estrogen receptor negative. Surveillance with breast exams and annual mammograms.  Return to clinic in 3 months for survivorship care plan visit. After that she can be followed once a year.  No orders of the defined types were placed in this encounter.  The patient has a good understanding of the overall plan. she agrees with it. she will call with any problems that may develop before the next visit here.  Nicholas Lose, MD 12/04/2018  Julious Oka Dorshimer, am acting as scribe for Dr. Nicholas Lose.  I have reviewed the above documentation for accuracy and completeness, and I agree with the above.

## 2018-12-03 NOTE — Telephone Encounter (Signed)
Called pt per 11/16 sch message - unable to reach pt - called son and her gave me patients mobile number - not listed. Called and was unable to reach pt , but left message with appt date and time

## 2018-12-04 ENCOUNTER — Inpatient Hospital Stay: Payer: PPO | Attending: Hematology and Oncology | Admitting: Hematology and Oncology

## 2018-12-04 ENCOUNTER — Other Ambulatory Visit: Payer: Self-pay

## 2018-12-04 DIAGNOSIS — Z923 Personal history of irradiation: Secondary | ICD-10-CM | POA: Insufficient documentation

## 2018-12-04 DIAGNOSIS — Z171 Estrogen receptor negative status [ER-]: Secondary | ICD-10-CM | POA: Diagnosis not present

## 2018-12-04 DIAGNOSIS — C50411 Malignant neoplasm of upper-outer quadrant of right female breast: Secondary | ICD-10-CM | POA: Diagnosis not present

## 2018-12-04 NOTE — Assessment & Plan Note (Signed)
09/20/2018:Right lumpectomy on 09/20/18 Yvonne Maynard): intermediate to high grade DCIS with a 0.4cm focus of invasive carcinoma, lymphovascular invasion present, clear margins. Stage Ia: ER/PR negative HER-2 positive 10/05/2018: Sentinel lymph node biopsy: 0/2 lymph nodes negative 11/06/2018- 12/03/2018: Adjuvant radiation  Treatment plan: No role of adjuvant systemic chemotherapy or antiestrogen therapy because of the size of the tumor and the fact that it is estrogen receptor negative. Surveillance with breast exams and annual mammograms.  Return to clinic in 3 months for survivorship care plan visit. After that she can be followed at long-term survivorship clinic once a year.

## 2018-12-05 ENCOUNTER — Telehealth: Payer: Self-pay | Admitting: Hematology and Oncology

## 2018-12-05 NOTE — Telephone Encounter (Signed)
I left a message regarding schedule  

## 2019-01-04 ENCOUNTER — Other Ambulatory Visit: Payer: Self-pay | Admitting: Family Medicine

## 2019-01-15 DIAGNOSIS — L821 Other seborrheic keratosis: Secondary | ICD-10-CM | POA: Diagnosis not present

## 2019-01-15 DIAGNOSIS — Z86018 Personal history of other benign neoplasm: Secondary | ICD-10-CM | POA: Diagnosis not present

## 2019-01-15 DIAGNOSIS — Z23 Encounter for immunization: Secondary | ICD-10-CM | POA: Diagnosis not present

## 2019-01-15 DIAGNOSIS — Z85828 Personal history of other malignant neoplasm of skin: Secondary | ICD-10-CM | POA: Diagnosis not present

## 2019-01-15 DIAGNOSIS — D225 Melanocytic nevi of trunk: Secondary | ICD-10-CM | POA: Diagnosis not present

## 2019-01-15 DIAGNOSIS — L814 Other melanin hyperpigmentation: Secondary | ICD-10-CM | POA: Diagnosis not present

## 2019-01-16 ENCOUNTER — Telehealth: Payer: Self-pay | Admitting: Family Medicine

## 2019-01-16 DIAGNOSIS — R7301 Impaired fasting glucose: Secondary | ICD-10-CM

## 2019-01-16 DIAGNOSIS — Z79899 Other long term (current) drug therapy: Secondary | ICD-10-CM

## 2019-01-16 DIAGNOSIS — R7989 Other specified abnormal findings of blood chemistry: Secondary | ICD-10-CM

## 2019-01-16 DIAGNOSIS — Z13220 Encounter for screening for lipoid disorders: Secondary | ICD-10-CM

## 2019-01-16 NOTE — Telephone Encounter (Signed)
Does pt need to have lab work done before CPE on 1/8

## 2019-01-22 NOTE — Telephone Encounter (Signed)
Please order: CMP Lipid A1c CBC with diff (elevated platelet count) Thanks.

## 2019-01-22 NOTE — Telephone Encounter (Signed)
Lab orders placed and pt is aware 

## 2019-01-24 DIAGNOSIS — Z79899 Other long term (current) drug therapy: Secondary | ICD-10-CM | POA: Diagnosis not present

## 2019-01-24 DIAGNOSIS — Z13220 Encounter for screening for lipoid disorders: Secondary | ICD-10-CM | POA: Diagnosis not present

## 2019-01-24 DIAGNOSIS — R7301 Impaired fasting glucose: Secondary | ICD-10-CM | POA: Diagnosis not present

## 2019-01-24 DIAGNOSIS — R7989 Other specified abnormal findings of blood chemistry: Secondary | ICD-10-CM | POA: Diagnosis not present

## 2019-01-25 ENCOUNTER — Encounter: Payer: PPO | Admitting: Nurse Practitioner

## 2019-01-25 LAB — CBC WITH DIFFERENTIAL/PLATELET
Basophils Absolute: 0.1 10*3/uL (ref 0.0–0.2)
Basos: 2 %
EOS (ABSOLUTE): 0.2 10*3/uL (ref 0.0–0.4)
Eos: 4 %
Hematocrit: 44.3 % (ref 34.0–46.6)
Hemoglobin: 15.5 g/dL (ref 11.1–15.9)
Immature Grans (Abs): 0 10*3/uL (ref 0.0–0.1)
Immature Granulocytes: 0 %
Lymphocytes Absolute: 1.7 10*3/uL (ref 0.7–3.1)
Lymphs: 28 %
MCH: 31.5 pg (ref 26.6–33.0)
MCHC: 35 g/dL (ref 31.5–35.7)
MCV: 90 fL (ref 79–97)
Monocytes Absolute: 0.5 10*3/uL (ref 0.1–0.9)
Monocytes: 8 %
Neutrophils Absolute: 3.6 10*3/uL (ref 1.4–7.0)
Neutrophils: 58 %
Platelets: 476 10*3/uL — ABNORMAL HIGH (ref 150–450)
RBC: 4.92 x10E6/uL (ref 3.77–5.28)
RDW: 13.6 % (ref 11.7–15.4)
WBC: 6 10*3/uL (ref 3.4–10.8)

## 2019-01-25 LAB — LIPID PANEL
Chol/HDL Ratio: 3.7 ratio (ref 0.0–4.4)
Cholesterol, Total: 170 mg/dL (ref 100–199)
HDL: 46 mg/dL (ref >39)
LDL Chol Calc (NIH): 102 mg/dL — ABNORMAL HIGH (ref 0–99)
Triglycerides: 122 mg/dL (ref 0–149)
VLDL Cholesterol Cal: 22 mg/dL (ref 5–40)

## 2019-01-25 LAB — HEMOGLOBIN A1C
Est. average glucose Bld gHb Est-mCnc: 111 mg/dL
Hgb A1c MFr Bld: 5.5 % (ref 4.8–5.6)

## 2019-01-25 NOTE — Progress Notes (Signed)
  Patient Name: Yvonne Maynard MRN: [id number] DOB: 1940/02/01 Referring Physician: Nicholas Lose (Profile Not Attached) Date of Service: 12/03/2018 [facility], [place]                                                        End Of Treatment Note  Diagnoses: C50.411-Malignant neoplasm of upper-outer quadrant of right female breast  Cancer Staging: Pathologic stage from 09/21/2018: Stage IA (pT1a, pN0, cM0, G1, ER-, PR-, HER2+) - Signed by Gardenia Phlegm, NP on 10/10/2018  Intent: Curative  Radiation Treatment Dates: 11/05/2018 through 12/03/2018 Site Technique Total Dose (Gy) Dose per Fx (Gy) Completed Fx Beam Energies  Breast: Breast_Rt 3D 40.05/40.05 2.67 15/15 6X  Breast: Breast_Rt_Bst specialPort 10/10 2 5/5 9E   Narrative: The patient tolerated radiation therapy relatively well.  Plan: The patient will follow-up with radiation oncology in 1 mo, or as needed.  -----------------------------------  Eppie Gibson, MD

## 2019-02-04 ENCOUNTER — Other Ambulatory Visit: Payer: Self-pay | Admitting: Family Medicine

## 2019-03-06 ENCOUNTER — Other Ambulatory Visit: Payer: Self-pay

## 2019-03-06 ENCOUNTER — Inpatient Hospital Stay: Payer: PPO | Attending: Adult Health | Admitting: Adult Health

## 2019-03-06 ENCOUNTER — Encounter: Payer: Self-pay | Admitting: Adult Health

## 2019-03-06 VITALS — BP 131/64 | HR 68 | Temp 98.2°F | Resp 18 | Ht 63.0 in | Wt 136.5 lb

## 2019-03-06 DIAGNOSIS — Z7982 Long term (current) use of aspirin: Secondary | ICD-10-CM | POA: Diagnosis not present

## 2019-03-06 DIAGNOSIS — C50411 Malignant neoplasm of upper-outer quadrant of right female breast: Secondary | ICD-10-CM

## 2019-03-06 DIAGNOSIS — Z85828 Personal history of other malignant neoplasm of skin: Secondary | ICD-10-CM | POA: Insufficient documentation

## 2019-03-06 DIAGNOSIS — Z171 Estrogen receptor negative status [ER-]: Secondary | ICD-10-CM | POA: Diagnosis not present

## 2019-03-06 DIAGNOSIS — M8580 Other specified disorders of bone density and structure, unspecified site: Secondary | ICD-10-CM | POA: Insufficient documentation

## 2019-03-06 DIAGNOSIS — Z923 Personal history of irradiation: Secondary | ICD-10-CM | POA: Insufficient documentation

## 2019-03-06 NOTE — Progress Notes (Signed)
SURVIVORSHIP VISIT:   BRIEF ONCOLOGIC HISTORY:  Oncology History  Malignant neoplasm of upper-outer quadrant of right female breast ([place])  08/08/2018 Cancer Staging   Staging form: Breast, AJCC 8th Edition - Clinical stage from 08/08/2018: Stage 0 (cTis (DCIS), cN0, cM0, ER: Unknown, PR: Unknown, HER2: Not Assessed) - Signed by Nicholas Lose, MD on 09/04/2018   08/15/2018 Initial Diagnosis   Routine screening mammogram detected a 0.6cm mass in the right breast, 5cm from the nipple with no axillary adenopathy. Biopsy showed high grade DCIS, with insufficient tumor for receptor studies.   09/21/2018 Surgery   Right lumpectomy on 09/20/18 Lucia Gaskins) [id number]): intermediate to high grade DCIS with a 0.4cm focus of invasive carcinoma, lymphovascular invasion present, clear margins. ER negative, PR negative, HER-2 positive, Ki67 40%  10/05/2018 lymph node biopsy: 2 lymph nodes negative for carcinoma   09/21/2018 Cancer Staging   Staging form: Breast, AJCC 8th Edition - Pathologic stage from 09/21/2018: Stage IA (pT1a, pN0, cM0, G1, ER-, PR-, HER2+) - Signed by Gardenia Phlegm, NP on 10/10/2018   11/05/2018 - 12/03/2018 Radiation Therapy   The patient initially received a dose of 40.05 Gy in 15 fractions to the breast using whole-breast tangent fields. This was delivered using a 3-D conformal technique. The pt received a boost delivering an additional 10 Gy in 5 fractions using a electron boost with 47mV electrons. The total dose was 50.05 Gy.     INTERVAL HISTORY:  Yvonne Maynard review her survivorship care plan detailing her treatment course for breast cancer, as well as monitoring long-term side effects of that treatment, education regarding health maintenance, screening, and overall wellness and health promotion.     Overall, Yvonne Maynard feeling quite well.    REVIEW OF SYSTEMS:  Review of Systems  Constitutional: Negative for appetite change, chills, fatigue, fever and unexpected  weight change.  HENT:   Negative for hearing loss, lump/mass, mouth sores and trouble swallowing.   Eyes: Negative for eye problems and icterus.  Respiratory: Negative for chest tightness, cough and shortness of breath.   Cardiovascular: Negative for chest pain, leg swelling and palpitations.  Gastrointestinal: Negative for abdominal distention, abdominal pain, constipation, diarrhea, nausea and vomiting.  Endocrine: Negative for hot flashes.  Musculoskeletal: Negative for arthralgias.  Skin: Negative for itching and rash.  Neurological: Negative for dizziness, extremity weakness, headaches and numbness.  Hematological: Negative for adenopathy. Does not bruise/bleed easily.  Psychiatric/Behavioral: Negative for depression. The patient is not nervous/anxious.   Breast: Denies any new nodularity, masses, tenderness, nipple changes, or nipple discharge.      ONCOLOGY TREATMENT TEAM:  1. Surgeon:  [facility] 2. Medical Oncologist: Dr. GLindi Adie 3. Radiation Oncologist: Dr. SIsidore Moos   PAST MEDICAL/SURGICAL HISTORY:  Past Medical History:  Diagnosis Date  . Asthma    None in years  . Cancer ([facility]    skin cancer nose   . Cerebrovascular disease   . Diverticulosis of colon 08/19/2010   Per CT scan  . DJD (degenerative joint disease) of cervical spine   . Generalized convulsive seizures ([place] 30 YEARS AGO   [place] Macular degeneration   . Osteopenia   . Seizure ([place]    none since before 2000  . Vertigo 08/2010   Past Surgical History:  Procedure Laterality Date  . AXILLARY SENTINEL NODE BIOPSY Right 10/05/2018   Procedure: RIGHT AXILLARY SENTINEL LYMPH NODE BIOPSY;  Surgeon: NAlphonsa Overall MD;  Location: [facility];  Service: General;  Laterality: Right;  . BREAST LUMPECTOMY WITH RADIOACTIVE SEED LOCALIZATION Right 09/21/2018   Procedure: RIGHT BREAST LUMPECTOMY WITH RADIOACTIVE SEED LOCALIZATION;  Surgeon: Alphonsa Overall,  MD;  Location: [facility];  Service: General;  Laterality: Right;  . COLONOSCOPY    . TONSILLECTOMY    . TUBAL LIGATION    . TUBAL LIGATION       ALLERGIES:  No Known Allergies   CURRENT MEDICATIONS:  Outpatient Encounter Medications as of 03/06/2019  Medication Sig  . albuterol (PROVENTIL HFA;VENTOLIN HFA) 108 (90 BASE) MCG/ACT inhaler Inhale 2 puffs into the lungs 2 (two) times daily as needed for wheezing or shortness of breath.  Marland Kitchen aspirin EC 81 MG tablet Take 81 mg by mouth at bedtime.   . Calcium Carbonate-Vitamin D (CALTRATE 600+D PO) Take 1 tablet by mouth 2 (two) times daily.  . fexofenadine (ALLEGRA) 180 MG tablet Take 180 mg by mouth daily.   . fluticasone (FLONASE) 50 MCG/ACT nasal spray USE 2 SPRAYS IN EACH NOSTRIL ONCE DAILY. (Patient taking differently: Place 1 spray into both nostrils at bedtime. )  . hydroxypropyl methylcellulose / hypromellose (ISOPTO TEARS / GONIOVISC) 2.5 % ophthalmic solution Place 1 drop into both eyes 3 (three) times daily as needed for dry eyes.  . montelukast (SINGULAIR) 10 MG tablet TAKE ONE TABLET BY MOUTH ONCE DAILY.  . Multiple Vitamin (MULTIVITAMIN WITH MINERALS) TABS tablet Take 1 tablet by mouth daily. Centrum Silver  . Multiple Vitamins-Minerals (PRESERVISION AREDS 2 PO) Take 1 tablet by mouth 2 (two) times daily.  . Omega-3 Fatty Acids (FISH OIL) 1000 MG CAPS Take 1,000 mg by mouth daily.  . vitamin B-12 (CYANOCOBALAMIN) 1000 MCG tablet Take 1,000 mcg by mouth daily.  . vitamin C (ASCORBIC ACID) 250 MG tablet Take 250 mg by mouth daily.  Marland Kitchen zinc gluconate 50 MG tablet Take 50 mg by mouth daily.   No facility-administered encounter medications on file as of 03/06/2019.     ONCOLOGIC FAMILY HISTORY:  Family History  Problem Relation Age of Onset  . Crohn's disease Mother   . Colon cancer Maternal Uncle 80     GENETIC COUNSELING/TESTING: Not at this time  SOCIAL HISTORY:  Social History   Socioeconomic History   . Marital status: Married    Spouse name: Not on file  . Number of children: Not on file  . Years of education: Not on file  . Highest education level: Not on file  Occupational History  . Not on file  Tobacco Use  . Smoking status: Never Smoker  . Smokeless tobacco: Never Used  Substance and Sexual Activity  . Alcohol use: No  . Drug use: No  . Sexual activity: Yes  Other Topics Concern  . Not on file  Social History Narrative   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   Social Determinants of Health   Financial Resource Strain:   . Difficulty of Paying Living Expenses: Not on file  Food Insecurity:   . Worried About Charity fundraiser in the Last Year: Not on file  . Ran Out of Food in the Last Year: Not on file  Transportation Needs: No Transportation Needs  . Lack of Transportation (Medical): No  . Lack of Transportation (Non-Medical): No  Physical Activity:   . Days of Exercise per Week: Not on file  . Minutes of Exercise per Session: Not on file  Stress:   . Feeling  of Stress : Not on file  Social Connections:   . Frequency of Communication with Friends and Family: Not on file  . Frequency of Social Gatherings with Friends and Family: Not on file  . Attends Religious Services: Not on file  . Active Member of Clubs or Organizations: Not on file  . Attends Archivist Meetings: Not on file  . Marital Status: Not on file  Intimate Partner Violence: Not At Risk  . Fear of Current or Ex-Partner: No  . Emotionally Abused: No  . Physically Abused: No  . Sexually Abused: No     OBSERVATIONS/OBJECTIVE:   BP 131/64 (BP Location: Left Arm, Patient Position: Sitting)   Pulse 68   Temp 98.2 F (36.8 C) (Temporal)   Resp 18   Ht _0  (1.6 m)   Wt 136 lb 8 oz (61.9 kg)   SpO2 100%   BMI 24.18 kg/m  GENERAL: Patient is a well appearing female in no acute distress HEENT:  Sclerae anicteric.  Oropharynx clear and moist. No  ulcerations or evidence of oropharyngeal candidiasis. Neck is supple.  NODES:  No cervical, supraclavicular, or axillary lymphadenopathy palpated.  BREAST EXAM:  Right breast s/p lumpectomy and radiation, no sign of local recurrence, left breast benign. LUNGS:  Clear to auscultation bilaterally.  No wheezes or rhonchi. HEART:  Regular rate and rhythm. No murmur appreciated. ABDOMEN:  Soft, nontender.  Positive, normoactive bowel sounds. No organomegaly palpated. MSK:  No focal spinal tenderness to palpation. Full range of motion bilaterally in the upper extremities. EXTREMITIES:  No peripheral edema.   SKIN:  Clear with no obvious rashes or skin changes. No nail dyscrasia. NEURO:  Nonfocal. Well oriented.  Appropriate affect.    LABORATORY DATA:  None for this visit.  DIAGNOSTIC IMAGING:  None for this visit.      ASSESSMENT AND PLAN:  Yvonne Maynard is a pleasant 79 y.o. female with Stage IA right breast invasive ductal carcinoma, ER-/PR-/HER2+, diagnosed in 07/2018, treated with lumpectomy and adjuvant radiation therapy.  She presents to the Survivorship [facility] for our initial meeting and routine follow-up post-completion of treatment for breast cancer.    1. Stage IA right breast cancer:  Yvonne Maynard is continuing to recover from definitive treatment for breast cancer. She will follow-up with her medical oncologist, Dr. Lindi Adie in in 6 months with history and physical exam per surveillance protocol.   Her mammogram is due 07/2019; orders placed today. Today, a comprehensive survivorship care plan and treatment summary was reviewed with the patient today detailing her breast cancer diagnosis, treatment course, potential late/long-term effects of treatment, appropriate follow-up care with recommendations for the future, and patient education resources.  A copy of this summary, along with a letter will be sent to the patient's primary care provider via mail/fax/In Basket message after today's visit.     2. Bone health:  Given Yvonne Maynard age/history of breast cancer, she is at risk for bone demineralization.  Her last DEXA scan was on 08/01/2018 and showed osteopenia with a T score of -2.1 in the right femoral neck.  She was given education on specific activities to promote bone health.  3. Cancer screening:  Due to Yvonne Maynard's history and her age, she should receive screening for skin cancers, colon cancer.  The information and recommendations are listed on the patient's comprehensive care plan/treatment summary and were reviewed in detail with the patient.    4. Health maintenance and wellness promotion: Yvonne Maynard was encouraged  to consume 5-7 servings of fruits and vegetables per day. We reviewed the "Nutrition Rainbow" handout, as well as the handout "Take Control of Your Health and Reduce Your Cancer Risk" from the [employer].  She was also encouraged to engage in moderate to vigorous exercise for 30 minutes per day most days of the week. We discussed the [employer] fitness program, which is designed for cancer survivors to help them become more physically fit after cancer treatments.  She was instructed to limit her alcohol consumption and continue to abstain from tobacco use.     5. Support services/counseling: It is not uncommon for this period of the patient's cancer care trajectory to be one of many emotions and stressors.  We discussed how this can be increasingly difficult during the times of quarantine and social distancing due to the COVID-19 pandemic.   She was given information regarding our available services and encouraged to contact me with any questions or for help enrolling in any of our support group/programs.    Follow up instructions:    -Return to cancer center in 6 months for f/u with Dr. Lindi Adie  -Mammogram due in 07/2019 -Follow up with me in 1 year  The patient was provided an opportunity to ask questions and all were answered. The patient agreed with  the plan and demonstrated an understanding of the instructions.   Total encounter time: 45 minutes*  Wilber Bihari, NP 03/06/19 10:59 AM Medical Oncology and Hematology [facility] [place],  [address] Tel. [phone]    Fax. [phone]  *Total Encounter Time as defined by the Centers for Medicare and Medicaid Services includes, in addition to the face-to-face time of a patient visit (documented in the note above) non-face-to-face time: obtaining and reviewing outside history, ordering and reviewing medications, tests or procedures, care coordination (communications with other health care professionals or caregivers) and documentation in the medical record.

## 2019-03-07 ENCOUNTER — Telehealth: Payer: Self-pay | Admitting: Adult Health

## 2019-03-07 NOTE — Telephone Encounter (Signed)
I could not reach patient regarding schedule  °

## 2019-03-11 ENCOUNTER — Telehealth: Payer: Self-pay | Admitting: Family Medicine

## 2019-03-11 ENCOUNTER — Other Ambulatory Visit: Payer: Self-pay | Admitting: *Deleted

## 2019-03-11 DIAGNOSIS — R7301 Impaired fasting glucose: Secondary | ICD-10-CM | POA: Diagnosis not present

## 2019-03-11 DIAGNOSIS — E876 Hypokalemia: Secondary | ICD-10-CM | POA: Diagnosis not present

## 2019-03-11 DIAGNOSIS — Z79899 Other long term (current) drug therapy: Secondary | ICD-10-CM | POA: Diagnosis not present

## 2019-03-11 NOTE — Telephone Encounter (Signed)
Patient met the nurse at the back door this morning and said that she was suppose to have labwork done for her physical with Hoyle Sauer next week and the orders weren't in.  Lab orders were put in by Dr. Nicki Reaper.

## 2019-03-12 LAB — BASIC METABOLIC PANEL
BUN/Creatinine Ratio: 14 (ref 12–28)
BUN: 12 mg/dL (ref 8–27)
CO2: 24 mmol/L (ref 20–29)
Calcium: 9.7 mg/dL (ref 8.7–10.3)
Chloride: 101 mmol/L (ref 96–106)
Creatinine, Ser: 0.88 mg/dL (ref 0.57–1.00)
GFR calc Af Amer: 73 mL/min/{1.73_m2} (ref >59)
GFR calc non Af Amer: 63 mL/min/{1.73_m2} (ref >59)
Glucose: 92 mg/dL (ref 65–99)
Potassium: 4.5 mmol/L (ref 3.5–5.2)
Sodium: 141 mmol/L (ref 134–144)

## 2019-03-12 LAB — HEPATIC FUNCTION PANEL
ALT: 20 IU/L (ref 0–32)
AST: 19 IU/L (ref 0–40)
Albumin: 4.3 g/dL (ref 3.7–4.7)
Alkaline Phosphatase: 90 IU/L (ref 39–117)
Bilirubin Total: 0.3 mg/dL (ref 0.0–1.2)
Bilirubin, Direct: 0.1 mg/dL (ref 0.00–0.40)
Total Protein: 6.9 g/dL (ref 6.0–8.5)

## 2019-03-22 ENCOUNTER — Ambulatory Visit (INDEPENDENT_AMBULATORY_CARE_PROVIDER_SITE_OTHER): Payer: PPO | Admitting: Nurse Practitioner

## 2019-03-22 ENCOUNTER — Encounter: Payer: Self-pay | Admitting: Nurse Practitioner

## 2019-03-22 ENCOUNTER — Other Ambulatory Visit: Payer: Self-pay

## 2019-03-22 VITALS — BP 132/76 | Temp 97.1°F | Ht 63.0 in | Wt 135.0 lb

## 2019-03-22 DIAGNOSIS — R21 Rash and other nonspecific skin eruption: Secondary | ICD-10-CM

## 2019-03-22 DIAGNOSIS — Z0000 Encounter for general adult medical examination without abnormal findings: Secondary | ICD-10-CM | POA: Diagnosis not present

## 2019-03-22 MED ORDER — TRIAMCINOLONE ACETONIDE 0.1 % EX CREA: 1.0000 "application " | TOPICAL_CREAM | Freq: Two times a day (BID) | CUTANEOUS | 0 refills | Status: DC
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
## 2019-03-22 NOTE — Progress Notes (Signed)
Subjective:    Patient ID: Yvonne Maynard, female    DOB: [birth year], 79 y.o.   MRN: [id number]  HPI  Patient presents for annual wellness exam today. Patient has complaints of chronic sinus drainage. Currently taking Singulair for allergies and reports trying several other OTC medications without relief. Complaints of faint rash on her neck for several days. Pruritic at times. No new products, jewelry or clothing. Localized to this area. Patient is followed by her specialists related to breast cancer in the right breast in 2020. Patient up to date on current vaccinations, dental and eye exams. Regular exercise, and healthy diet. No vaginal bleeding or pelvic pain. No new sexual partners.   Review of Systems  Constitutional: Negative for activity change and appetite change.  Respiratory: Negative for cough, chest tightness, shortness of breath and wheezing.   Cardiovascular: Negative for chest pain.  Gastrointestinal: Negative for abdominal distention, abdominal pain, anal bleeding, blood in stool, constipation, diarrhea, nausea and vomiting.  Genitourinary: Negative for decreased urine volume, difficulty urinating, dysuria, enuresis, flank pain, frequency, genital sores, pelvic pain, vaginal bleeding and vaginal discharge.       Objective:   Physical Exam Constitutional:      General: She is not in acute distress.    Appearance: Normal appearance. She is normal weight.  HENT:     Right Ear: Tympanic membrane and ear canal normal.     Left Ear: Tympanic membrane and ear canal normal.  Neck:     Comments: Thyroid nontender, no mass or goiter noted.  Cardiovascular:     Rate and Rhythm: Normal rate and regular rhythm.     Pulses: Normal pulses.     Heart sounds: Normal heart sounds.  Pulmonary:     Effort: Pulmonary effort is normal.     Breath sounds: Normal breath sounds. No wheezing or rales.  Chest:     Chest wall: No mass, lacerations, deformity, swelling or tenderness.   Breasts:        Right: No inverted nipple, mass, nipple discharge, skin change or tenderness.        Left: No inverted nipple, mass, nipple discharge, skin change or tenderness.  Abdominal:     General: Bowel sounds are normal. There is no distension.     Palpations: Abdomen is soft.     Tenderness: There is no abdominal tenderness.  Genitourinary:    General: Normal vulva.     Vagina: No vaginal discharge.     Comments: External GU: no rash or lesion. Vagina: pale, no discharge. Cervix normal in appearance. No CMT. Bimanual exam: no tenderness or obvious masses.  Musculoskeletal:     Cervical back: Neck supple. No tenderness.  Lymphadenopathy:     Cervical: No cervical adenopathy.     Upper Body:     Right upper body: No supraclavicular, axillary or pectoral adenopathy.     Left upper body: No supraclavicular, axillary or pectoral adenopathy.  Skin:    General: Skin is warm and dry.     Comments: Faint pink patch of linear minimally raised papules noted on the right side of the neck.   Neurological:     Mental Status: She is alert.  Psychiatric:        Mood and Affect: Mood normal.        Behavior: Behavior normal.        Thought Content: Thought content normal.        Judgment: Judgment normal.     Recent  Results (from the past 2160 hour(s))  Lipid Profile     Status: Abnormal   Collection Time: 01/24/19  8:02 AM  Result Value Ref Range   Cholesterol, Total 170 100 - 199 mg/dL   Triglycerides 122 0 - 149 mg/dL   HDL 46 >39 mg/dL   VLDL Cholesterol Cal 22 5 - 40 mg/dL   LDL Chol Calc (NIH) 102 (H) 0 - 99 mg/dL   Chol/HDL Ratio 3.7 0.0 - 4.4 ratio    Comment:                                   T. Chol/HDL Ratio                                             Men  Women                               1/2 Avg.Risk  3.4    3.3                                   Avg.Risk  5.0    4.4                                2X Avg.Risk  9.6    7.1                                3X Avg.Risk  23.4   11.0   Hemoglobin A1c     Status: None   Collection Time: 01/24/19  8:02 AM  Result Value Ref Range   Hgb A1c MFr Bld 5.5 4.8 - 5.6 %    Comment:          Prediabetes: 5.7 - 6.4          Diabetes: >6.4          Glycemic control for adults with diabetes: <7.0    Est. average glucose Bld gHb Est-mCnc 111 mg/dL  CBC with Differential     Status: Abnormal   Collection Time: 01/24/19  8:02 AM  Result Value Ref Range   WBC 6.0 3.4 - 10.8 x10E3/uL   RBC 4.92 3.77 - 5.28 x10E6/uL   Hemoglobin 15.5 11.1 - 15.9 g/dL   Hematocrit 44.3 34.0 - 46.6 %   MCV 90 79 - 97 fL   MCH 31.5 26.6 - 33.0 pg   MCHC 35.0 31.5 - 35.7 g/dL   RDW 13.6 11.7 - 15.4 %   Platelets 476 (H) 150 - 450 x10E3/uL   Neutrophils 58 Not Estab. %   Lymphs 28 Not Estab. %   Monocytes 8 Not Estab. %   Eos 4 Not Estab. %   Basos 2 Not Estab. %   Neutrophils Absolute 3.6 1.4 - 7.0 x10E3/uL   Lymphocytes Absolute 1.7 0.7 - 3.1 x10E3/uL   Monocytes Absolute 0.5 0.1 - 0.9 x10E3/uL   EOS (ABSOLUTE) 0.2 0.0 - 0.4 x10E3/uL   Basophils Absolute 0.1 0.0 - 0.2 x10E3/uL   Immature Granulocytes 0 Not Estab. %  Immature Grans (Abs) 0.0 0.0 - 0.1 [id number]  Basic metabolic panel     Status: None   Collection Time: 03/11/19  8:37 AM  Result Value Ref Range   Glucose 92 65 - 99 mg/dL   BUN 12 8 - 27 mg/dL   Creatinine, Ser 0.88 0.57 - 1.00 mg/dL   GFR calc non Af Amer 63 >59 mL/min/1.73   GFR calc Af Amer 73 >59 mL/min/1.73   BUN/Creatinine Ratio 14 12 - 28   Sodium 141 134 - 144 mmol/L   Potassium 4.5 3.5 - 5.2 mmol/L   Chloride 101 96 - 106 mmol/L   CO2 24 20 - 29 mmol/L   Calcium 9.7 8.7 - 10.3 mg/dL  Hepatic function panel     Status: None   Collection Time: 03/11/19  8:37 AM  Result Value Ref Range   Total Protein 6.9 6.0 - 8.5 g/dL   Albumin 4.3 3.7 - 4.7 g/dL   Bilirubin Total 0.3 0.0 - 1.2 mg/dL   Bilirubin, Direct 0.10 0.00 - 0.40 mg/dL   Alkaline Phosphatase 90 39 - 117 IU/L   AST 19 0 - 40 IU/L   ALT 20  0 - 32 IU/L         Assessment & Plan:  Routine general medical examination at a health care facility  Rash and nonspecific skin eruption/probable contact dermatitis    Meds ordered this encounter  Medications  . triamcinolone cream (KENALOG) 0.1 %    Sig: Apply 1 application topically 2 (two) times daily. Prn rash; use up to 2 weeks    Dispense:  30 g    Refill:  0    Order Specific Question:   Supervising Provider    Answer:   Kathyrn Drown [phone]    Lab results reviewed with patient.  Bone scan results discussed with patient. Results suggest osteopenia/ pre-osteoporosis.  Recommend retesting in two years.  Recommend weight bearing exercises such as walking. Continue vitamin D and calcium supplementation.  Topical steroid cream as directed for rash on areas of neck. Call back in 10-14 days if no improvement, sooner if worse.  Follow up within one year or as needed.  Call office with questions or concerns.

## 2019-03-23 ENCOUNTER — Encounter: Payer: Self-pay | Admitting: Nurse Practitioner

## 2019-04-26 DIAGNOSIS — Z171 Estrogen receptor negative status [ER-]: Secondary | ICD-10-CM | POA: Diagnosis not present

## 2019-04-26 DIAGNOSIS — C50911 Malignant neoplasm of unspecified site of right female breast: Secondary | ICD-10-CM | POA: Diagnosis not present

## 2019-06-07 ENCOUNTER — Other Ambulatory Visit: Payer: Self-pay | Admitting: Family Medicine

## 2019-06-13 ENCOUNTER — Ambulatory Visit (INDEPENDENT_AMBULATORY_CARE_PROVIDER_SITE_OTHER): Payer: PPO | Admitting: Family Medicine

## 2019-06-13 ENCOUNTER — Encounter: Payer: Self-pay | Admitting: Family Medicine

## 2019-06-13 ENCOUNTER — Other Ambulatory Visit: Payer: Self-pay

## 2019-06-13 VITALS — HR 88 | Temp 98.7°F | Resp 16

## 2019-06-13 DIAGNOSIS — J0110 Acute frontal sinusitis, unspecified: Secondary | ICD-10-CM | POA: Diagnosis not present

## 2019-06-13 DIAGNOSIS — L509 Urticaria, unspecified: Secondary | ICD-10-CM

## 2019-06-13 MED ORDER — AMOXICILLIN 500 MG PO CAPS: 500.0000 mg | ORAL_CAPSULE | Freq: Three times a day (TID) | ORAL | 0 refills | Status: DC

## 2019-06-13 NOTE — Progress Notes (Signed)
Patient ID: Yvonne Maynard, female    DOB: 11/20/1940, 79 y.o.   MRN: [id number]   Chief Complaint  Patient presents with  . Cough    congestion-sinus symptoms- for few weeks  . Rash    comes and goes   Subjective:    HPI Pt seen as car visit for concern of nasal congestion, coughing and rash. Pt stating she never gets rid of post nasal drip.  Stating she takes all the otc allergy meds and "nothing helps." Has tried allegra, claritin, and zyrtec.  Also taking flonase. Now having a worsening cough.  Some clear productive sputum.  No fever, sore throat or ear pain.  Has headache.  Has intermittent rash since 2/21.  Discussed on her physical with the NP on last visit.  Rash and hives that come and go on the neck and near the rt breast surgical scar.  Has been putting on cocoa butter and vit E as recommended by the surgeon. No other new creams, lotions, soaps or detergents. The rash is not there today.  Medical History Niyah has a past medical history of Asthma, Cancer ([place]), Cerebrovascular disease, Diverticulosis of colon (08/19/2010), DJD (degenerative joint disease) of cervical spine, Generalized convulsive seizures ([place]) (30 YEARS AGO), Macular degeneration, Osteopenia, Seizure ([place]), and Vertigo (08/2010).   Outpatient Encounter Medications as of 06/13/2019  Medication Sig  . albuterol (PROVENTIL HFA;VENTOLIN HFA) 108 (90 BASE) MCG/ACT inhaler Inhale 2 puffs into the lungs 2 (two) times daily as needed for wheezing or shortness of breath.  Marland Kitchen amoxicillin (AMOXIL) 500 MG capsule Take 1 capsule (500 mg total) by mouth 3 (three) times daily.  Marland Kitchen aspirin EC 81 MG tablet Take 81 mg by mouth at bedtime.   . Calcium Carbonate-Vitamin D (CALTRATE 600+D PO) Take 1 tablet by mouth 2 (two) times daily.  . hydroxypropyl methylcellulose / hypromellose (ISOPTO TEARS / GONIOVISC) 2.5 % ophthalmic solution Place 1 drop into both eyes 3 (three) times daily as needed for dry eyes.  . montelukast  (SINGULAIR) 10 MG tablet TAKE ONE TABLET BY MOUTH ONCE DAILY.  . Multiple Vitamin (MULTIVITAMIN WITH MINERALS) TABS tablet Take 1 tablet by mouth daily. Centrum Silver  . Multiple Vitamins-Minerals (PRESERVISION AREDS 2 PO) Take 1 tablet by mouth 2 (two) times daily.  . Omega-3 Fatty Acids (FISH OIL) 1000 MG CAPS Take 1,000 mg by mouth daily.  Marland Kitchen OVER THE COUNTER MEDICATION chlorimaletae 4mg  one every 4 hours prn allergies  . triamcinolone cream (KENALOG) 0.1 % Apply 1 application topically 2 (two) times daily. Prn rash; use up to 2 weeks  . vitamin B-12 (CYANOCOBALAMIN) 1000 MCG tablet Take 1,000 mcg by mouth daily.  . vitamin C (ASCORBIC ACID) 250 MG tablet Take 250 mg by mouth daily.  Marland Kitchen zinc gluconate 50 MG tablet Take 50 mg by mouth daily.   No facility-administered encounter medications on file as of 06/13/2019.     Review of Systems  Constitutional: Negative for chills and fever.  HENT: Positive for congestion and postnasal drip. Negative for ear discharge, ear pain, facial swelling, rhinorrhea, sinus pressure, sinus pain, sneezing and sore throat.   Eyes: Negative for pain, discharge and itching.  Respiratory: Negative for cough, shortness of breath and wheezing.   Gastrointestinal: Negative for constipation, diarrhea, nausea and vomiting.  Skin: Positive for rash (resolved at this time).  Neurological: Positive for headaches.     Vitals Pulse 88   Temp 98.7 F (37.1 C) (Temporal)   Resp 16  SpO2 96%   Objective:   Physical Exam Vitals and nursing note reviewed.  Constitutional:      General: She is not in acute distress.    Appearance: Normal appearance. She is not ill-appearing or toxic-appearing.  HENT:     Head: Normocephalic and atraumatic.     Right Ear: Tympanic membrane, ear canal and external ear normal.     Left Ear: Tympanic membrane, ear canal and external ear normal.     Nose: Nose normal. No congestion or rhinorrhea.     Comments: No ttp over frontal or  maxillary sinuses.    Mouth/Throat:     Mouth: Mucous membranes are moist.     Pharynx: Oropharynx is clear. No oropharyngeal exudate or posterior oropharyngeal erythema.  Eyes:     Extraocular Movements: Extraocular movements intact.     Conjunctiva/sclera: Conjunctivae normal.     Pupils: Pupils are equal, round, and reactive to light.  Cardiovascular:     Rate and Rhythm: Normal rate and regular rhythm.     Pulses: Normal pulses.     Heart sounds: Normal heart sounds. No murmur.  Pulmonary:     Effort: Pulmonary effort is normal. No respiratory distress.     Breath sounds: No wheezing, rhonchi or rales.  Musculoskeletal:     Cervical back: Normal range of motion.  Lymphadenopathy:     Cervical: No cervical adenopathy.  Skin:    General: Skin is warm and dry.     Findings: No rash.     Comments: No hives on neck at this time.  Neurological:     Mental Status: She is alert and oriented to person, place, and time.  Psychiatric:        Mood and Affect: Mood normal.        Behavior: Behavior normal.      Assessment and Plan   1. Acute non-recurrent frontal sinusitis - amoxicillin (AMOXIL) 500 MG capsule; Take 1 capsule (500 mg total) by mouth 3 (three) times daily.  Dispense: 30 capsule; Refill: 0  2. Urticaria of unknown origin   Reviewed usual course of viral illness vs. Sinusitis and antibiotic use.  Gave watch and wait script for amoxicillin. --Tylenol, delsym at night otc, during the day can use allegra-D or zyrtec daily for allergy symptoms.  Advising to take an allergy pill daily for 10-14 days. increase fluids, sleep at an angle, and cont flonase.    For the uriticaria - resolved at this time. could be chronic urticaria vs. Contact dermatitis- stop creams/lotions on neck and use cortisone otc and zyrtec daily.   If worsening or not improving in the next 3-5 days to call or rto.  Pt in agreement.   F/u prn.

## 2019-07-29 DIAGNOSIS — H04123 Dry eye syndrome of bilateral lacrimal glands: Secondary | ICD-10-CM | POA: Diagnosis not present

## 2019-07-29 DIAGNOSIS — H26492 Other secondary cataract, left eye: Secondary | ICD-10-CM | POA: Diagnosis not present

## 2019-07-29 DIAGNOSIS — H524 Presbyopia: Secondary | ICD-10-CM | POA: Diagnosis not present

## 2019-07-29 DIAGNOSIS — H353112 Nonexudative age-related macular degeneration, right eye, intermediate dry stage: Secondary | ICD-10-CM | POA: Diagnosis not present

## 2019-08-08 DIAGNOSIS — Z853 Personal history of malignant neoplasm of breast: Secondary | ICD-10-CM | POA: Diagnosis not present

## 2019-08-08 DIAGNOSIS — R928 Other abnormal and inconclusive findings on diagnostic imaging of breast: Secondary | ICD-10-CM | POA: Diagnosis not present

## 2019-08-12 ENCOUNTER — Other Ambulatory Visit: Payer: Self-pay

## 2019-08-12 ENCOUNTER — Ambulatory Visit (INDEPENDENT_AMBULATORY_CARE_PROVIDER_SITE_OTHER): Payer: PPO | Admitting: Family Medicine

## 2019-08-12 ENCOUNTER — Encounter: Payer: Self-pay | Admitting: Family Medicine

## 2019-08-12 VITALS — BP 132/78 | HR 83 | Temp 96.5°F | Ht 63.0 in | Wt 135.0 lb

## 2019-08-12 DIAGNOSIS — R053 Chronic cough: Secondary | ICD-10-CM

## 2019-08-12 DIAGNOSIS — L237 Allergic contact dermatitis due to plants, except food: Secondary | ICD-10-CM | POA: Diagnosis not present

## 2019-08-12 DIAGNOSIS — R05 Cough: Secondary | ICD-10-CM | POA: Diagnosis not present

## 2019-08-12 MED ORDER — PREDNISONE 10 MG PO TABS: ORAL_TABLET | ORAL | 0 refills | Status: DC

## 2019-08-12 MED ORDER — ALBUTEROL SULFATE HFA 108 (90 BASE) MCG/ACT IN AERS: 2.0000 | INHALATION_SPRAY | Freq: Two times a day (BID) | RESPIRATORY_TRACT | 0 refills | Status: DC | PRN

## 2019-08-12 NOTE — Progress Notes (Signed)
Patient ID: Yvonne Maynard, female    DOB: February 21, 1940, 79 y.o.   MRN: [id number]   Chief Complaint  Patient presents with  . Poison Oak   Subjective:    HPI  Pt has had poison oak for a week. Started last Saturday while working outside. Pt has area on both arms, right side, and some on leg. Pt has tried OTC scrub and spray.  No other sick contacts at home. No weeping or surrounding erythema or warmth. Was doing some yard work and came into contact with [place] or poison oak. No f/v/n/v/d.   Medical History Yvonne Maynard has a past medical history of Asthma, Cancer ([place]), Cerebrovascular disease, Diverticulosis of colon (08/19/2010), DJD (degenerative joint disease) of cervical spine, Generalized convulsive seizures ([place]) (30 YEARS AGO), Macular degeneration, Osteopenia, Seizure ([place]), and Vertigo (08/2010).   Outpatient Encounter Medications as of 08/12/2019  Medication Sig  . albuterol (VENTOLIN HFA) 108 (90 Base) MCG/ACT inhaler Inhale 2 puffs into the lungs 2 (two) times daily as needed for wheezing or shortness of breath.  Marland Kitchen aspirin EC 81 MG tablet Take 81 mg by mouth at bedtime.   . Calcium Carbonate-Vitamin D (CALTRATE 600+D PO) Take 1 tablet by mouth 2 (two) times daily.  . hydroxypropyl methylcellulose / hypromellose (ISOPTO TEARS / GONIOVISC) 2.5 % ophthalmic solution Place 1 drop into both eyes 3 (three) times daily as needed for dry eyes.  . montelukast (SINGULAIR) 10 MG tablet TAKE ONE TABLET BY MOUTH ONCE DAILY.  . Multiple Vitamin (MULTIVITAMIN WITH MINERALS) TABS tablet Take 1 tablet by mouth daily. Centrum Silver  . Multiple Vitamins-Minerals (PRESERVISION AREDS 2 PO) Take 1 tablet by mouth 2 (two) times daily.  . Omega-3 Fatty Acids (FISH OIL) 1000 MG CAPS Take 1,000 mg by mouth daily.  Marland Kitchen OVER THE COUNTER MEDICATION chlorimaletae 4mg  one every 4 hours prn allergies  . triamcinolone cream (KENALOG) 0.1 % Apply 1 application topically 2 (two) times daily. Prn rash; use  up to 2 weeks  . vitamin B-12 (CYANOCOBALAMIN) 1000 MCG tablet Take 1,000 mcg by mouth daily.  . vitamin C (ASCORBIC ACID) 250 MG tablet Take 250 mg by mouth daily.  Marland Kitchen zinc gluconate 50 MG tablet Take 50 mg by mouth daily.  . [DISCONTINUED] albuterol (PROVENTIL HFA;VENTOLIN HFA) 108 (90 BASE) MCG/ACT inhaler Inhale 2 puffs into the lungs 2 (two) times daily as needed for wheezing or shortness of breath.  . predniSONE (DELTASONE) 10 MG tablet Take 4 tab p.o. x3 days, then 3 tab x 3 days, then 2 tab x 3 days, then 1 tab x 3 days.  . [DISCONTINUED] amoxicillin (AMOXIL) 500 MG capsule Take 1 capsule (500 mg total) by mouth 3 (three) times daily.   No facility-administered encounter medications on file as of 08/12/2019.     Review of Systems  Constitutional: Negative.   HENT: Negative.   Eyes: Negative.   Respiratory: Negative.   Cardiovascular: Negative.   Gastrointestinal: Negative.   Endocrine: Negative.   Genitourinary: Negative.   Musculoskeletal: Negative.   Skin: Positive for rash.     Vitals BP (!) 132/78   Pulse 83   Temp (!) 96.5 F (35.8 C)   Ht 5\' 3"  (1.6 m)   Wt 135 lb (61.2 kg)   SpO2 97%   BMI 23.91 kg/m   Objective:   Physical Exam Constitutional:      Appearance: Normal appearance.  HENT:     Head: Normocephalic.  Cardiovascular:  Heart sounds: Normal heart sounds.  Pulmonary:     Effort: Pulmonary effort is normal.     Breath sounds: Normal breath sounds.  Musculoskeletal:     Cervical back: Normal range of motion.  Skin:    General: Skin is warm and dry.     Findings: Erythema and rash present.  Neurological:     Mental Status: She is alert.      Assessment and Plan   1. Poison ivy dermatitis - predniSONE (DELTASONE) 10 MG tablet; Take 4 tab p.o. x3 days, then 3 tab x 3 days, then 2 tab x 3 days, then 1 tab x 3 days.  Dispense: 30 tablet; Refill: 0  2. Chronic cough - albuterol (VENTOLIN HFA) 108 (90 Base) MCG/ACT inhaler; Inhale 2  puffs into the lungs 2 (two) times daily as needed for wheezing or shortness of breath.  Dispense: 18 g; Refill: 0    Pt advised to f/u with her pcp regarding her chronic coughing.  Has been going on for years and told it was her allergies.  No new symptoms with the coughing today.  I advised pt to use her albuterol more often and before bed.  Pt stating has had work up and xrays in past.  Contact dermatitis r/t poison ivy- Pt given prednisone taper and to cont HC cream or benadryl prn.  Pt in agreement.  F/u prn.

## 2019-08-29 ENCOUNTER — Ambulatory Visit (HOSPITAL_COMMUNITY)
Admission: RE | Admit: 2019-08-29 | Discharge: 2019-08-29 | Disposition: A | Discharge status: Home / Self Care | Payer: PPO | Source: Ambulatory Visit | Attending: Family Medicine | Admitting: Family Medicine

## 2019-08-29 ENCOUNTER — Encounter: Payer: Self-pay | Admitting: Family Medicine

## 2019-08-29 ENCOUNTER — Other Ambulatory Visit: Payer: Self-pay

## 2019-08-29 ENCOUNTER — Ambulatory Visit (INDEPENDENT_AMBULATORY_CARE_PROVIDER_SITE_OTHER): Payer: PPO | Admitting: Family Medicine

## 2019-08-29 VITALS — BP 120/76 | Temp 97.9°F | Ht 63.0 in | Wt 134.8 lb

## 2019-08-29 DIAGNOSIS — J449 Chronic obstructive pulmonary disease, unspecified: Secondary | ICD-10-CM | POA: Diagnosis not present

## 2019-08-29 DIAGNOSIS — R059 Cough, unspecified: Secondary | ICD-10-CM

## 2019-08-29 DIAGNOSIS — R05 Cough: Secondary | ICD-10-CM | POA: Diagnosis not present

## 2019-08-29 DIAGNOSIS — R490 Dysphonia: Secondary | ICD-10-CM | POA: Diagnosis not present

## 2019-08-29 NOTE — Progress Notes (Signed)
   Subjective:    Patient ID: Yvonne Maynard, female    DOB: Oct 24, 1940, 79 y.o.   MRN: [id number]  HPI  Patient arrives to discuss chronic cough. Patient states she had a cough since winter but when she recently took prednisone for poison oak her cough went away and has not returned since finishing prednisone. This patient has had a prolonged history of cough but recently she was on prednisone for poison ivy and the cough went away She denies any regurgitation issues but she does states she has intermittent hoarseness mainly in the morning time She does have a history of breast cancer She has had radiation in the past Has never been a smoker Denies weight loss fever chills sweats Review of Systems  Constitutional: Negative for activity change and fever.  HENT: Negative for congestion, ear pain and rhinorrhea.   Eyes: Negative for discharge.  Respiratory: Negative for cough, shortness of breath and wheezing.   Cardiovascular: Negative for chest pain.       Objective:   Physical Exam Vitals reviewed.  Constitutional:      General: She is not in acute distress. HENT:     Head: Normocephalic and atraumatic.  Eyes:     General:        Right eye: No discharge.        Left eye: No discharge.  Neck:     Trachea: No tracheal deviation.  Cardiovascular:     Rate and Rhythm: Normal rate and regular rhythm.     Heart sounds: Normal heart sounds. No murmur heard.   Pulmonary:     Effort: Pulmonary effort is normal. No respiratory distress.     Breath sounds: Normal breath sounds.  Lymphadenopathy:     Cervical: No cervical adenopathy.  Skin:    General: Skin is warm and dry.  Neurological:     Mental Status: She is alert.     Coordination: Coordination normal.  Psychiatric:        Behavior: Behavior normal.           Assessment & Plan:  Cough-it could be allergic or environmental given that it did get better with prednisone If it does return may need to consider  steroid inhaler Also cannot rule out the possibility of reflux causing the hoarseness as well as the cough Chest x-ray reasonable Referral to ENT for evaluation of the vocal cords Patient is notify us if ongoing coughing

## 2019-09-03 ENCOUNTER — Telehealth: Payer: Self-pay | Admitting: Hematology and Oncology

## 2019-09-03 ENCOUNTER — Telehealth: Payer: Self-pay | Admitting: *Deleted

## 2019-09-03 ENCOUNTER — Other Ambulatory Visit: Payer: Self-pay | Admitting: Family Medicine

## 2019-09-03 ENCOUNTER — Other Ambulatory Visit: Payer: Self-pay

## 2019-09-03 ENCOUNTER — Inpatient Hospital Stay: Payer: PPO | Attending: Hematology and Oncology | Admitting: Hematology and Oncology

## 2019-09-03 DIAGNOSIS — Z853 Personal history of malignant neoplasm of breast: Secondary | ICD-10-CM | POA: Diagnosis not present

## 2019-09-03 DIAGNOSIS — Z923 Personal history of irradiation: Secondary | ICD-10-CM | POA: Diagnosis not present

## 2019-09-03 DIAGNOSIS — C50411 Malignant neoplasm of upper-outer quadrant of right female breast: Secondary | ICD-10-CM | POA: Diagnosis not present

## 2019-09-03 DIAGNOSIS — Z171 Estrogen receptor negative status [ER-]: Secondary | ICD-10-CM

## 2019-09-03 DIAGNOSIS — Z7982 Long term (current) use of aspirin: Secondary | ICD-10-CM | POA: Insufficient documentation

## 2019-09-03 NOTE — Telephone Encounter (Signed)
Kathyrn Drown, MD  08/30/2019 12:02 PM EDT     Chest x-ray is normal we are setting up appointment with ENT if you do not hear anything from [place] within the next 2 weeks please let us know   Patient notified of results and verbalized understanding.

## 2019-09-03 NOTE — Telephone Encounter (Signed)
Scheduled appt per 8/17 los. Gave pt a print out of AVS.

## 2019-09-03 NOTE — Progress Notes (Signed)
Patient Care Team: Kathyrn Drown, MD as PCP - General (Family Medicine) Nicholas Lose, MD as Consulting Physician (Hematology and Oncology) Eppie Gibson, MD as Attending Physician (Radiation Oncology) Alphonsa Overall, MD as Consulting Physician (General Surgery)  DIAGNOSIS:    ICD-10-CM   1. Malignant neoplasm of upper-outer quadrant of right breast in female, estrogen receptor negative ([place])  C50.411    Z17.1     SUMMARY OF ONCOLOGIC HISTORY: Oncology History  Malignant neoplasm of upper-outer quadrant of right female breast ([place])  08/08/2018 Cancer Staging   Staging form: Breast, AJCC 8th Edition - Clinical stage from 08/08/2018: Stage 0 (cTis (DCIS), cN0, cM0, ER: Unknown, PR: Unknown, HER2: Not Assessed) - Signed by Nicholas Lose, MD on 09/04/2018   08/15/2018 Initial Diagnosis   Routine screening mammogram detected a 0.6cm mass in the right breast, 5cm from the nipple with no axillary adenopathy. Biopsy showed high grade DCIS, with insufficient tumor for receptor studies.   09/21/2018 Surgery   Right lumpectomy on 09/20/18 Lucia Gaskins) [phone]): intermediate to high grade DCIS with a 0.4cm focus of invasive carcinoma, lymphovascular invasion present, clear margins. ER negative, PR negative, HER-2 positive, Ki67 40%  10/05/2018 lymph node biopsy: 2 lymph nodes negative for carcinoma   09/21/2018 Cancer Staging   Staging form: Breast, AJCC 8th Edition - Pathologic stage from 09/21/2018: Stage IA (pT1a, pN0, cM0, G1, ER-, PR-, HER2+) - Signed by Gardenia Phlegm, NP on 10/10/2018   11/05/2018 - 12/03/2018 Radiation Therapy   The patient initially received a dose of 40.05 Gy in 15 fractions to the breast using whole-breast tangent fields. This was delivered using a 3-D conformal technique. The pt received a boost delivering an additional 10 Gy in 5 fractions using a electron boost with 59mV electrons. The total dose was 50.05 Gy.     CHIEF COMPLIANT: Follow-up of right breast  cancer  INTERVAL HISTORY: Yvonne HASLEYis a 79y.o. with above-mentioned history of right breast cancer who underwent a lumpectomy, radiation, and is currently on surveillance. She presents to the clinic today for follow-up.     ALLERGIES:  has No Known Allergies.  MEDICATIONS:  Current Outpatient Medications  Medication Sig Dispense Refill  . albuterol (VENTOLIN HFA) 108 (90 Base) MCG/ACT inhaler Inhale 2 puffs into the lungs 2 (two) times daily as needed for wheezing or shortness of breath. 18 g 0  . aspirin EC 81 MG tablet Take 81 mg by mouth at bedtime.     . Calcium Carbonate-Vitamin D (CALTRATE 600+D PO) Take 1 tablet by mouth 2 (two) times daily.    . hydroxypropyl methylcellulose / hypromellose (ISOPTO TEARS / GONIOVISC) 2.5 % ophthalmic solution Place 1 drop into both eyes 3 (three) times daily as needed for dry eyes.    . montelukast (SINGULAIR) 10 MG tablet TAKE ONE TABLET BY MOUTH ONCE DAILY. 30 tablet 0  . Multiple Vitamin (MULTIVITAMIN WITH MINERALS) TABS tablet Take 1 tablet by mouth daily. Centrum Silver    . Multiple Vitamins-Minerals (PRESERVISION AREDS 2 PO) Take 1 tablet by mouth 2 (two) times daily.    . Omega-3 Fatty Acids (FISH OIL) 1000 MG CAPS Take 1,000 mg by mouth daily.    .Marland KitchenOVER THE COUNTER MEDICATION chlorimaletae 41mone every 4 hours prn allergies    . predniSONE (DELTASONE) 10 MG tablet Take 4 tab p.o. x3 days, then 3 tab x 3 days, then 2 tab x 3 days, then 1 tab x 3 days. (Patient not taking: Reported  on 08/29/2019) 30 tablet 0  . triamcinolone cream (KENALOG) 0.1 % Apply 1 application topically 2 (two) times daily. Prn rash; use up to 2 weeks (Patient not taking: Reported on 08/29/2019) 30 g 0  . vitamin B-12 (CYANOCOBALAMIN) 1000 MCG tablet Take 1,000 mcg by mouth daily.    . vitamin C (ASCORBIC ACID) 250 MG tablet Take 250 mg by mouth daily.    Marland Kitchen zinc gluconate 50 MG tablet Take 50 mg by mouth daily.     No current facility-administered medications for  this visit.    PHYSICAL EXAMINATION: ECOG PERFORMANCE STATUS: 1 - Symptomatic but completely ambulatory  Vitals:   09/03/19 1133  BP: 121/66  Pulse: 62  Resp: 18  Temp: 97.6 F (36.4 C)  SpO2: 100%   Filed Weights   09/03/19 1133  Weight: 134 lb 12.8 oz (61.1 kg)    BREAST: No palpable masses or nodules in either right or left breasts. No palpable axillary supraclavicular or infraclavicular adenopathy no breast tenderness or nipple discharge. (exam performed in the presence of a chaperone)  LABORATORY DATA:  I have reviewed the data as listed CMP Latest Ref Rng & Units 03/11/2019 12/18/2017 12/23/2016  Glucose 65 - 99 mg/dL 92 95 101(H)  BUN 8 - 27 mg/dL '12 11 13  ' Creatinine 0.57 - 1.00 mg/dL 0.88 0.78 0.88  Sodium 134 - 144 mmol/L 141 145(H) 143  Potassium 3.5 - 5.2 mmol/L 4.5 5.0 4.7  Chloride 96 - 106 mmol/L 101 105 105  CO2 20 - 29 mmol/L '24 24 26  ' Calcium 8.7 - 10.3 mg/dL 9.7 9.6 9.7  Total Protein 6.0 - 8.5 g/dL 6.9 - 6.8  Total Bilirubin 0.0 - 1.2 mg/dL 0.3 - 0.3  Alkaline Phos 39 - 117 IU/L 90 - 74  AST 0 - 40 IU/L 19 - 20  ALT 0 - 32 IU/L 20 - 20    Lab Results  Component Value Date   WBC 6.0 01/24/2019   HGB 15.5 01/24/2019   HCT 44.3 01/24/2019   MCV 90 01/24/2019   PLT 476 (H) 01/24/2019   NEUTROABS 3.6 01/24/2019    ASSESSMENT & PLAN:  Malignant neoplasm of upper-outer quadrant of right female breast ([place]) 09/20/2018:Right lumpectomy on 09/20/18 Lucia Gaskins): intermediate to high grade DCIS with a 0.4cm focus of invasive carcinoma, lymphovascular invasion present, clear margins. Stage Ia: ER/PR negative HER-2 positive 10/05/2018: Sentinel lymph node biopsy: 0/2 lymph nodes negative 11/06/2018- 12/03/2018: Adjuvant radiation  Treatment plan: No role of adjuvant systemic chemotherapy or antiestrogen therapy because of the size of the tumor and the fact that it is estrogen receptor negative.  Breast cancer surveillance: 1. Breast exam   2. mammogram at  [facility]  Return to clinic in 1 year for follow-up    No orders of the defined types were placed in this encounter.  The patient has a good understanding of the overall plan. she agrees with it. she will call with any problems that may develop before the next visit here.  Total time spent: 20 mins including face to face time and time spent for planning, charting and coordination of care  Nicholas Lose, MD 09/03/2019  I, Cloyde Reams Dorshimer, am acting as scribe for Dr. Nicholas Lose.  I have reviewed the above documentation for accuracy and completeness, and I agree with the above.

## 2019-09-03 NOTE — Assessment & Plan Note (Signed)
09/20/2018:Right lumpectomy on 09/20/18 Yvonne Maynard): intermediate to high grade DCIS with a 0.4cm focus of invasive carcinoma, lymphovascular invasion present, clear margins. Stage Ia: ER/PR negative HER-2 positive 10/05/2018: Sentinel lymph node biopsy: 0/2 lymph nodes negative 11/06/2018- 12/03/2018: Adjuvant radiation  Treatment plan: No role of adjuvant systemic chemotherapy or antiestrogen therapy because of the size of the tumor and the fact that it is estrogen receptor negative.  Breast cancer surveillance: 1. Breast exam 09/03/2019: Benign 2. mammogram at [facility]  Return to clinic in 1 year for follow-up

## 2019-09-05 ENCOUNTER — Encounter: Payer: Self-pay | Admitting: Family Medicine

## 2019-10-08 DIAGNOSIS — R490 Dysphonia: Secondary | ICD-10-CM | POA: Diagnosis not present

## 2019-10-08 DIAGNOSIS — J300 Vasomotor rhinitis: Secondary | ICD-10-CM | POA: Insufficient documentation

## 2019-11-08 ENCOUNTER — Other Ambulatory Visit: Payer: Self-pay | Admitting: Family Medicine

## 2020-02-19 DIAGNOSIS — R6889 Other general symptoms and signs: Secondary | ICD-10-CM | POA: Diagnosis not present

## 2020-03-04 NOTE — Progress Notes (Signed)
[facility] Follow up:    Kathyrn Drown, MD [place] [address]   DIAGNOSIS: Cancer Staging Malignant neoplasm of upper-outer quadrant of right female breast [facility]) Staging form: Breast, AJCC 8th Edition - Clinical stage from 08/08/2018: Stage 0 (cTis (DCIS), cN0, cM0, ER: Unknown, PR: Unknown, HER2: Not Assessed) - Signed by Nicholas Lose, MD on 09/04/2018 Stage prefix: Initial diagnosis - Pathologic stage from 09/21/2018: Stage IA (pT1a, pN0, cM0, G1, ER-, PR-, HER2+) - Signed by Gardenia Phlegm, NP on 10/10/2018 Stage prefix: Initial diagnosis Histologic grading system: 3 grade system   SUMMARY OF ONCOLOGIC HISTORY: Oncology History  Malignant neoplasm of upper-outer quadrant of right female breast ([place])  08/08/2018 Cancer Staging   Staging form: Breast, AJCC 8th Edition - Clinical stage from 08/08/2018: Stage 0 (cTis (DCIS), cN0, cM0, ER: Unknown, PR: Unknown, HER2: Not Assessed) - Signed by Nicholas Lose, MD on 09/04/2018   08/15/2018 Initial Diagnosis   Routine screening mammogram detected a 0.6cm mass in the right breast, 5cm from the nipple with no axillary adenopathy. Biopsy showed high grade DCIS, with insufficient tumor for receptor studies.   09/21/2018 Surgery   Right lumpectomy on 09/20/18 Lucia Gaskins) [phone]): intermediate to high grade DCIS with a 0.4cm focus of invasive carcinoma, lymphovascular invasion present, clear margins. ER negative, PR negative, HER-2 positive, Ki67 40%  10/05/2018 lymph node biopsy: 2 lymph nodes negative for carcinoma   09/21/2018 Cancer Staging   Staging form: Breast, AJCC 8th Edition - Pathologic stage from 09/21/2018: Stage IA (pT1a, pN0, cM0, G1, ER-, PR-, HER2+) - Signed by Gardenia Phlegm, NP on 10/10/2018   11/05/2018 - 12/03/2018 Radiation Therapy   The patient initially received a dose of 40.05 Gy in 15 fractions to the breast using whole-breast tangent fields. This was delivered  using a 3-D conformal technique. The pt received a boost delivering an additional 10 Gy in 5 fractions using a electron boost with 54mV electrons. The total dose was 50.05 Gy.     CURRENT THERAPY: observation  INTERVAL HISTORY: Yvonne MAYFIELD89y.o. female returns for evaluation of her h/o HER-2 positive breast cancer.  She is feeling well.  She notes that her surgeon, Dr. NLucia Gaskinsretired and she will now be seen by Dr. BNinfa Linden    PElyceunderwent a mammogram in 07/2019 that showed no evidence of malignancy and breast density category B.  She is not exercising as much as she does during the summer, but notes that she has remained active with her housework, taking care of her grandchildren, going outside, walking to her mailbox, and going up and down her stairs in her home.    PKatarynahas not had any health changes since her most recent visit with u[place] is up to date with her PCP visits, skin cancer screening, mammograms, and has graduated from colon and gyn cancer screenings.  She is overall feeling and doing well.    Patient Active Problem List   Diagnosis Date Noted  . Ductal carcinoma in situ (DCIS) of right breast 08/29/2018  . Malignant neoplasm of upper-outer quadrant of right female breast ([place] 08/15/2018  . Impaired fasting glucose 11/18/2014  . Macular degeneration of right eye 02/05/2014  . Dizziness and giddiness 08/19/2010  . Hypokalemia 08/19/2010  . Diverticulosis of colon 08/19/2010  . DJD (degenerative joint disease) of cervical spine 08/19/2010  . Cerebral microvascular disease 08/19/2010  . Osteopenia     has No Known Allergies.  MEDICAL HISTORY: Past Medical History:  Diagnosis Date  . Asthma    None in years  . Cancer ([place])    skin cancer nose   . Cerebrovascular disease   . Diverticulosis of colon 08/19/2010   Per CT scan  . DJD (degenerative joint disease) of cervical spine   . Generalized convulsive seizures ([place]) 30 YEARS AGO   Remotely,  following head injury from [place]  . Macular degeneration   . Osteopenia   . Seizure ([place])    none since before 2000  . Vertigo 08/2010    SURGICAL HISTORY: Past Surgical History:  Procedure Laterality Date  . AXILLARY SENTINEL NODE BIOPSY Right 10/05/2018   Procedure: RIGHT AXILLARY SENTINEL LYMPH NODE BIOPSY;  Surgeon: Alphonsa Overall, MD;  Location: [facility];  Service: General;  Laterality: Right;  . BREAST LUMPECTOMY WITH RADIOACTIVE SEED LOCALIZATION Right 09/21/2018   Procedure: RIGHT BREAST LUMPECTOMY WITH RADIOACTIVE SEED LOCALIZATION;  Surgeon: Alphonsa Overall, MD;  Location: [facility];  Service: General;  Laterality: Right;  . COLONOSCOPY    . TONSILLECTOMY    . TUBAL LIGATION    . TUBAL LIGATION      SOCIAL HISTORY: Social History   Socioeconomic History  . Marital status: Married    Spouse name: Not on file  . Number of children: Not on file  . Years of education: Not on file  . Highest education level: Not on file  Occupational History  . Not on file  Tobacco Use  . Smoking status: Never Smoker  . Smokeless tobacco: Never Used  Vaping Use  . Vaping Use: Never used  Substance and Sexual Activity  . Alcohol use: No  . Drug use: No  . Sexual activity: Yes  Other Topics Concern  . Not on file  Social History Narrative   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   Social Determinants of Health   Financial Resource Strain: Not on file  Food Insecurity: Not on file  Transportation Needs: Not on file  Physical Activity: Not on file  Stress: Not on file  Social Connections: Not on file  Intimate Partner Violence: Not on file    FAMILY HISTORY: Family History  Problem Relation Age of Onset  . Crohn's disease Mother   . Colon cancer Maternal Uncle 80    Review of Systems  Constitutional: Negative for appetite change, chills, fatigue, fever and unexpected weight change.  HENT:   Negative for hearing loss,  lump/mass and trouble swallowing.   Eyes: Negative for eye problems and icterus.  Respiratory: Negative for chest tightness, cough and shortness of breath.   Cardiovascular: Negative for chest pain, leg swelling and palpitations.  Gastrointestinal: Negative for abdominal distention, abdominal pain, constipation, diarrhea, nausea and vomiting.  Endocrine: Negative for hot flashes.  Genitourinary: Negative for difficulty urinating.   Musculoskeletal: Negative for arthralgias.  Skin: Negative for itching and rash.  Neurological: Negative for dizziness, extremity weakness, headaches and numbness.  Hematological: Negative for adenopathy. Does not bruise/bleed easily.  Psychiatric/Behavioral: Negative for depression. The patient is not nervous/anxious.       PHYSICAL EXAMINATION  ECOG PERFORMANCE STATUS: 0 - Asymptomatic  Vitals:   03/05/20 1006  BP: 138/61  Pulse: 64  Resp: 18  Temp: 97.9 F (36.6 C)  SpO2: 100%    Physical Exam Constitutional:      General: She is not in acute distress.    Appearance: Normal appearance. She is not toxic-appearing.  HENT:  Head: Normocephalic and atraumatic.  Eyes:     General: No scleral icterus. Cardiovascular:     Rate and Rhythm: Normal rate and regular rhythm.     Pulses: Normal pulses.     Heart sounds: Normal heart sounds.  Pulmonary:     Effort: Pulmonary effort is normal.     Breath sounds: Normal breath sounds.     Comments: Right breast s/p lumpectomy and radiation, no sign of local recurrence, left breast benign. Abdominal:     General: Abdomen is flat. Bowel sounds are normal. There is no distension.     Palpations: Abdomen is soft.     Tenderness: There is no abdominal tenderness.  Musculoskeletal:        General: No swelling.     Cervical back: Neck supple.  Lymphadenopathy:     Cervical: No cervical adenopathy.  Skin:    General: Skin is warm and dry.     Findings: No rash.  Neurological:     General: No focal  deficit present.     Mental Status: She is alert.  Psychiatric:        Mood and Affect: Mood normal.        Behavior: Behavior normal.     LABORATORY DATA:  CBC    Component Value Date/Time   WBC 6.0 01/24/2019 0802   WBC 5.2 10/28/2013 0704   RBC 4.92 01/24/2019 0802   RBC 4.79 10/28/2013 0704   HGB 15.5 01/24/2019 0802   HCT 44.3 01/24/2019 0802   PLT 476 (H) 01/24/2019 0802   MCV 90 01/24/2019 0802   MCH 31.5 01/24/2019 0802   MCH 29.6 10/28/2013 0704   MCHC 35.0 01/24/2019 0802   MCHC 33.9 10/28/2013 0704   RDW 13.6 01/24/2019 0802   LYMPHSABS 1.7 01/24/2019 0802   MONOABS 0.4 10/28/2013 0704   EOSABS 0.2 01/24/2019 0802   BASOSABS 0.1 01/24/2019 0802    CMP     Component Value Date/Time   NA 141 03/11/2019 0837   K 4.5 03/11/2019 0837   CL 101 03/11/2019 0837   CO2 24 03/11/2019 0837   GLUCOSE 92 03/11/2019 0837   GLUCOSE 98 10/28/2013 0704   BUN 12 03/11/2019 0837   CREATININE 0.88 03/11/2019 0837   CREATININE 0.85 10/28/2013 0704   CALCIUM 9.7 03/11/2019 0837   PROT 6.9 03/11/2019 0837   ALBUMIN 4.3 03/11/2019 0837   AST 19 03/11/2019 0837   ALT 20 03/11/2019 0837   ALKPHOS 90 03/11/2019 0837   BILITOT 0.3 03/11/2019 0837   GFRNONAA 63 03/11/2019 0837   GFRAA 73 03/11/2019 0837          ASSESSMENT and THERAPY PLAN:   Malignant neoplasm of upper-outer quadrant of right female breast ([place]) 09/20/2018:Right lumpectomy on 09/20/18 Lucia Gaskins): intermediate to high grade DCIS with a 0.4cm focus of invasive carcinoma, lymphovascular invasion present, clear margins. Stage Ia: ER/PR negative HER-2 positive 10/05/2018: Sentinel lymph node biopsy: 0/2 lymph nodes negative 11/06/2018- 12/03/2018: Adjuvant radiation  Treatment plan: No role of adjuvant systemic chemotherapy or antiestrogen therapy because of the size of the tumor and the fact that it is estrogen receptor negative.  Breast cancer surveillance: 1. Breast exam 02/2020 benign 2. Mammogram due  in 07/2020  Evaluna has no clinical or radiographic signs of breast cancer recurrence.  She is doing quite well and is up to date on her health maintenance which is great.  She and I discussed healthy diet and exercise, which help reduce the risk of  recurrence.  She is active and in great shape.  She plans on doing some low weight upper arm strengthening exercises, which are fine,and I reviewed with her to start low and if she increases weight to do so gradually to prevent lymphedema.  She understands this.    She was recommended to f/u with Dr. Ninfa Linden in 6 months, and Dr. Lindi Adie in one year.  She knows to call for any questions that may arise between now and her next appointment.  We are happy to see her sooner if needed.   Total encounter time: 20 minutes*  Wilber Bihari, NP 03/05/20 10:52 AM Medical Oncology and Hematology [facility] [place], [place] [address] Tel. [phone]    Fax. [phone]  *Total Encounter Time as defined by the Centers for Medicare and Medicaid Services includes, in addition to the face-to-face time of a patient visit (documented in the note above) non-face-to-face time: obtaining and reviewing outside history, ordering and reviewing medications, tests or procedures, care coordination (communications with other health care professionals or caregivers) and documentation in the medical record.

## 2020-03-04 NOTE — Assessment & Plan Note (Addendum)
09/20/2018:Right lumpectomy on 09/20/18 Yvonne Maynard): intermediate to high grade DCIS with a 0.4cm focus of invasive carcinoma, lymphovascular invasion present, clear margins. Stage Ia: ER/PR negative HER-2 positive 10/05/2018: Sentinel lymph node biopsy: 0/2 lymph nodes negative 11/06/2018- 12/03/2018: Adjuvant radiation  Treatment plan: No role of adjuvant systemic chemotherapy or antiestrogen therapy because of the size of the tumor and the fact that it is estrogen receptor negative.  Breast cancer surveillance: 1. Breast exam 02/2020 benign 2. Mammogram due in 07/2020  Yvonne Maynard has no clinical or radiographic signs of breast cancer recurrence.  She is doing quite well and is up to date on her health maintenance which is great.  She and I discussed healthy diet and exercise, which help reduce the risk of recurrence.  She is active and in great shape.  She plans on doing some low weight upper arm strengthening exercises, which are fine,and I reviewed with her to start low and if she increases weight to do so gradually to prevent lymphedema.  She understands this.    She was recommended to f/u with Dr. Ninfa Linden in 6 months, and Dr. Lindi Adie in one year.  She knows to call for any questions that may arise between now and her next appointment.  We are happy to see her sooner if needed.

## 2020-03-05 ENCOUNTER — Encounter: Payer: Self-pay | Admitting: Adult Health

## 2020-03-05 ENCOUNTER — Telehealth: Payer: Self-pay | Admitting: Adult Health

## 2020-03-05 ENCOUNTER — Inpatient Hospital Stay: Payer: PPO | Attending: Adult Health | Admitting: Adult Health

## 2020-03-05 ENCOUNTER — Other Ambulatory Visit: Payer: Self-pay

## 2020-03-05 VITALS — BP 138/61 | HR 64 | Temp 97.9°F | Resp 18 | Ht 63.0 in | Wt 136.0 lb

## 2020-03-05 DIAGNOSIS — M8580 Other specified disorders of bone density and structure, unspecified site: Secondary | ICD-10-CM | POA: Diagnosis not present

## 2020-03-05 DIAGNOSIS — Z923 Personal history of irradiation: Secondary | ICD-10-CM | POA: Insufficient documentation

## 2020-03-05 DIAGNOSIS — C50411 Malignant neoplasm of upper-outer quadrant of right female breast: Secondary | ICD-10-CM | POA: Diagnosis not present

## 2020-03-05 DIAGNOSIS — Z171 Estrogen receptor negative status [ER-]: Secondary | ICD-10-CM | POA: Diagnosis not present

## 2020-03-05 NOTE — Telephone Encounter (Signed)
Scheduled appts per 2/17 los. Gave pt a print out of AVS.

## 2020-03-12 ENCOUNTER — Telehealth: Payer: Self-pay | Admitting: Family Medicine

## 2020-03-12 DIAGNOSIS — R7301 Impaired fasting glucose: Secondary | ICD-10-CM

## 2020-03-12 DIAGNOSIS — Z1329 Encounter for screening for other suspected endocrine disorder: Secondary | ICD-10-CM

## 2020-03-12 DIAGNOSIS — Z13220 Encounter for screening for lipoid disorders: Secondary | ICD-10-CM

## 2020-03-12 DIAGNOSIS — Z79899 Other long term (current) drug therapy: Secondary | ICD-10-CM

## 2020-03-12 NOTE — Telephone Encounter (Signed)
Patient has physical on 3/25 and needing labs done.

## 2020-03-13 NOTE — Telephone Encounter (Signed)
Last labs completed 03/11/19 hepatic and BMET. Please advise. Thank you

## 2020-03-13 NOTE — Telephone Encounter (Signed)
Please order: CMP, Lipid, CBC, TSH and A1C (impaired fasting glucose)

## 2020-03-13 NOTE — Telephone Encounter (Signed)
Lab orders placed and pt is aware  (Number on message was sons number; not patients number)

## 2020-03-23 DIAGNOSIS — R7301 Impaired fasting glucose: Secondary | ICD-10-CM | POA: Diagnosis not present

## 2020-03-23 DIAGNOSIS — Z1329 Encounter for screening for other suspected endocrine disorder: Secondary | ICD-10-CM | POA: Diagnosis not present

## 2020-03-23 DIAGNOSIS — Z79899 Other long term (current) drug therapy: Secondary | ICD-10-CM | POA: Diagnosis not present

## 2020-03-23 DIAGNOSIS — Z13220 Encounter for screening for lipoid disorders: Secondary | ICD-10-CM | POA: Diagnosis not present

## 2020-03-24 LAB — CBC WITH DIFFERENTIAL/PLATELET
Basophils Absolute: 0.1 10*3/uL (ref 0.0–0.2)
Basos: 2 %
EOS (ABSOLUTE): 0.3 10*3/uL (ref 0.0–0.4)
Eos: 4 %
Hematocrit: 45.6 % (ref 34.0–46.6)
Hemoglobin: 14.9 g/dL (ref 11.1–15.9)
Immature Grans (Abs): 0 10*3/uL (ref 0.0–0.1)
Immature Granulocytes: 1 %
Lymphocytes Absolute: 1.7 10*3/uL (ref 0.7–3.1)
Lymphs: 25 %
MCH: 30.1 pg (ref 26.6–33.0)
MCHC: 32.7 g/dL (ref 31.5–35.7)
MCV: 92 fL (ref 79–97)
Monocytes Absolute: 0.4 10*3/uL (ref 0.1–0.9)
Monocytes: 6 %
Neutrophils Absolute: 4.1 10*3/uL (ref 1.4–7.0)
Neutrophils: 62 %
Platelets: 566 10*3/uL — ABNORMAL HIGH (ref 150–450)
RBC: 4.95 x10E6/uL (ref 3.77–5.28)
RDW: 14 % (ref 11.7–15.4)
WBC: 6.6 10*3/uL (ref 3.4–10.8)

## 2020-03-24 LAB — COMPREHENSIVE METABOLIC PANEL
ALT: 21 IU/L (ref 0–32)
AST: 21 IU/L (ref 0–40)
Albumin/Globulin Ratio: 1.8 (ref 1.2–2.2)
Albumin: 4.2 g/dL (ref 3.7–4.7)
Alkaline Phosphatase: 89 IU/L (ref 44–121)
BUN/Creatinine Ratio: 15 (ref 12–28)
BUN: 12 mg/dL (ref 8–27)
Bilirubin Total: 0.3 mg/dL (ref 0.0–1.2)
CO2: 23 mmol/L (ref 20–29)
Calcium: 9.3 mg/dL (ref 8.7–10.3)
Chloride: 105 mmol/L (ref 96–106)
Creatinine, Ser: 0.8 mg/dL (ref 0.57–1.00)
Globulin, Total: 2.3 g/dL (ref 1.5–4.5)
Glucose: 89 mg/dL (ref 65–99)
Potassium: 4.7 mmol/L (ref 3.5–5.2)
Sodium: 142 mmol/L (ref 134–144)
Total Protein: 6.5 g/dL (ref 6.0–8.5)
eGFR: 75 mL/min/{1.73_m2} (ref >59)

## 2020-03-24 LAB — LIPID PANEL
Chol/HDL Ratio: 3.4 ratio (ref 0.0–4.4)
Cholesterol, Total: 161 mg/dL (ref 100–199)
HDL: 47 mg/dL (ref >39)
LDL Chol Calc (NIH): 94 mg/dL (ref 0–99)
Triglycerides: 107 mg/dL (ref 0–149)
VLDL Cholesterol Cal: 20 mg/dL (ref 5–40)

## 2020-03-24 LAB — TSH: TSH: 1.87 u[IU]/mL (ref 0.450–4.500)

## 2020-03-24 LAB — HEMOGLOBIN A1C
Est. average glucose Bld gHb Est-mCnc: 114 mg/dL
Hgb A1c MFr Bld: 5.6 % (ref 4.8–5.6)

## 2020-04-01 ENCOUNTER — Other Ambulatory Visit: Payer: Self-pay | Admitting: Family Medicine

## 2020-04-10 ENCOUNTER — Ambulatory Visit (INDEPENDENT_AMBULATORY_CARE_PROVIDER_SITE_OTHER): Payer: PPO | Admitting: Nurse Practitioner

## 2020-04-10 ENCOUNTER — Other Ambulatory Visit: Payer: Self-pay

## 2020-04-10 ENCOUNTER — Encounter: Payer: Self-pay | Admitting: Nurse Practitioner

## 2020-04-10 ENCOUNTER — Telehealth: Payer: Self-pay

## 2020-04-10 VITALS — BP 128/82 | Temp 96.9°F | Wt 134.0 lb

## 2020-04-10 DIAGNOSIS — Z01419 Encounter for gynecological examination (general) (routine) without abnormal findings: Secondary | ICD-10-CM | POA: Diagnosis not present

## 2020-04-10 DIAGNOSIS — Z1159 Encounter for screening for other viral diseases: Secondary | ICD-10-CM | POA: Diagnosis not present

## 2020-04-10 NOTE — Progress Notes (Signed)
 Subjective:    Patient ID: Yvonne Maynard, female    DOB: 05/05/1940, 80 y.o.   MRN: [id number]  HPI Patient presents today for her physical.  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  Last colonoscopy was 2013 and states she was told she would not need another one unless she had symptoms warranting an evaluation.  Annual mammograms, next one scheduled for August 2022.  Follows with eye doctor for macular degeneration, next visit is in August.  Regular dental visits.  DEXA scan in 2020, takes calcium + D supplement for osteopenia and has a repeat DEXA scheduled for the summer.  Regular skin cancer screenings, next is scheduled for April of this year.    Chronic allergies which continue to bother her; no new symptoms or changes.  Rhinitis, watery, itchy eyes, postnasal drip, and cough.  Has had cough evaluated by ENT in the past and they put her on ipratropium bromide nasal spray.  She does not notice any improvement.  She has been to an allergist in the past as well.  Tried many OTC antihistamines including allegra D, zyrtex, claritin, doesn't feel that any of them get rid of the drainage or cough.  Worst at night and in the morning.  Year-round.    Also c/o mild arthritic pain in her back, wrists, ankles, knees common after she works pulling weeds in her garden, plays with her grandkids, or with activity.  Review of Systems  Constitutional: Negative for fatigue, fever and unexpected weight change.  HENT: Positive for postnasal drip and rhinorrhea. Negative for congestion, ear pain and sneezing.   Eyes: Positive for discharge and itching.       Itchy, watery eyes especially in the sun if she doesn't have her sunglasses.  Respiratory: Positive for cough. Negative for shortness of breath and wheezing.   Cardiovascular: Negative for chest pain, palpitations and leg swelling.  Gastrointestinal: Negative for abdominal pain, blood in stool,  constipation, diarrhea, nausea and vomiting.  Genitourinary: Negative for difficulty urinating, dysuria, frequency, genital sores, hematuria, pelvic pain, urgency, vaginal bleeding and vaginal discharge.  Skin: Negative for rash and wound.  Neurological: Negative for dizziness, weakness, numbness and headaches.  Psychiatric/Behavioral: Negative for sleep disturbance and suicidal ideas. The patient is not nervous/anxious.    Flowsheet Row Office Visit from 04/10/2020 in [facility]  PHQ-2 Total Score 0           Objective:   Physical Exam Vitals and nursing note reviewed. Exam conducted with a chaperone present.  Constitutional:      General: She is not in acute distress.    Appearance: She is normal weight.  HENT:     Ears:     Comments: TMs: mild clear effusion bilaterally; no erythema.     Nose: Rhinorrhea present.     Comments: Boggy, pale mucous membranes noted in nares BIL    Mouth/Throat:     Comments: Slight erythema present posterior pharynx, no discharge, swelling, tonsils not enlarged, nontender Eyes:     Conjunctiva/sclera: Conjunctivae normal.  Neck:     Comments: Thyroid not enlarged, nontender, no nodules noted Cardiovascular:     Rate and Rhythm: Normal rate and regular rhythm.     Heart sounds: Normal heart sounds. No murmur heard.   Pulmonary:     Effort: Pulmonary effort is normal.     Breath sounds: Normal breath sounds.  Chest:  Breasts:       Right: Normal. No swelling, mass, skin change, tenderness, axillary adenopathy or supraclavicular adenopathy.     Left: Normal. No swelling, mass, skin change, tenderness, axillary adenopathy or supraclavicular adenopathy.    Abdominal:     General: Abdomen is flat. Bowel sounds are normal.     Palpations: Abdomen is soft. There is no mass.     Tenderness: There is no abdominal tenderness.  Genitourinary:    General: Normal vulva.     Labia:        Right: No rash, tenderness, lesion or injury.         Left: No rash, tenderness, lesion or injury.      Vagina: No vaginal discharge.     Comments: Vaginal canal moist, pink, no erythema or discharge present. Cervix normal in appearance. No CMT. Bimanual exam: no tenderness or obvious masses.  Musculoskeletal:     Right lower leg: No edema.     Left lower leg: No edema.  Lymphadenopathy:     Cervical: No cervical adenopathy.     Upper Body:     Right upper body: No supraclavicular, axillary or pectoral adenopathy.     Left upper body: No supraclavicular, axillary or pectoral adenopathy.  Neurological:     Mental Status: She is alert and oriented to person, place, and time.  Psychiatric:        Mood and Affect: Mood normal.        Behavior: Behavior normal.        Thought Content: Thought content normal.        Judgment: Judgment normal.    Vitals:   04/10/20 0945  BP: 128/82  Temp: (!) 96.9 F (36.1 C)   Recent Results (from the past 2160 hour(s))  Comprehensive Metabolic Panel (CMET)     Status: None   Collection Time: 03/23/20  8:08 AM  Result Value Ref Range   Glucose 89 65 - 99 mg/dL   BUN 12 8 - 27 mg/dL   Creatinine, Ser 0.80 0.57 - 1.00 mg/dL   eGFR 75 >59 mL/min/1.73    Comment: **In accordance with recommendations from the NKF-ASN Task force,**   [facility] has updated its eGFR calculation to the 2021 CKD-EPI   creatinine equation that estimates kidney function without a race   variable.    BUN/Creatinine Ratio 15 12 - 28   Sodium 142 134 - 144 mmol/L   Potassium 4.7 3.5 - 5.2 mmol/L   Chloride 105 96 - 106 mmol/L   CO2 23 20 - 29 mmol/L   Calcium 9.3 8.7 - 10.3 mg/dL   Total Protein 6.5 6.0 - 8.5 g/dL   Albumin 4.2 3.7 - 4.7 g/dL   Globulin, Total 2.3 1.5 - 4.5 g/dL   Albumin/Globulin Ratio 1.8 1.2 - 2.2   Bilirubin Total 0.3 0.0 - 1.2 mg/dL   Alkaline Phosphatase 89 44 - 121 IU/L   AST 21 0 - 40 IU/L   ALT 21 0 - 32 IU/L  Lipid Profile     Status: None   Collection Time: 03/23/20  8:08 AM  Result  Value Ref Range   Cholesterol, Total 161 100 - 199 mg/dL   Triglycerides 107 0 - 149 mg/dL   HDL 47 >39 mg/dL   VLDL Cholesterol Cal 20 5 - 40 mg/dL   LDL Chol Calc (NIH) 94 0 - 99 mg/dL   Chol/HDL Ratio 3.4 0.0 - 4.4 ratio    Comment:  T. Chol/HDL Ratio                                             Men  Women                               1/2 Avg.Risk  3.4    3.3                                   Avg.Risk  5.0    4.4                                2X Avg.Risk  9.6    7.1                                3X Avg.Risk 23.4   11.0   CBC with Differential     Status: Abnormal   Collection Time: 03/23/20  8:08 AM  Result Value Ref Range   WBC 6.6 3.4 - 10.8 x10E3/uL   RBC 4.95 3.77 - 5.28 x10E6/uL   Hemoglobin 14.9 11.1 - 15.9 g/dL   Hematocrit 45.6 34.0 - 46.6 %   MCV 92 79 - 97 fL   MCH 30.1 26.6 - 33.0 pg   MCHC 32.7 31.5 - 35.7 g/dL   RDW 14.0 11.7 - 15.4 %   Platelets 566 (H) 150 - 450 x10E3/uL   Neutrophils 62 Not Estab. %   Lymphs 25 Not Estab. %   Monocytes 6 Not Estab. %   Eos 4 Not Estab. %   Basos 2 Not Estab. %   Neutrophils Absolute 4.1 1.4 - 7.0 x10E3/uL   Lymphocytes Absolute 1.7 0.7 - 3.1 x10E3/uL   Monocytes Absolute 0.4 0.1 - 0.9 x10E3/uL   EOS (ABSOLUTE) 0.3 0.0 - 0.4 x10E3/uL   Basophils Absolute 0.1 0.0 - 0.2 x10E3/uL   Immature Granulocytes 1 Not Estab. %   Immature Grans (Abs) 0.0 0.0 - 0.1 x10E3/uL  TSH     Status: None   Collection Time: 03/23/20  8:08 AM  Result Value Ref Range   TSH 1.870 0.450 - 4.500 uIU/mL  Hemoglobin A1c     Status: None   Collection Time: 03/23/20  8:08 AM  Result Value Ref Range   Hgb A1c MFr Bld 5.6 4.8 - 5.6 %    Comment:          Prediabetes: 5.7 - 6.4          Diabetes: >6.4          Glycemic control for adults with diabetes: <7.0    Est. average glucose Bld gHb Est-mCnc 114 mg/dL            Assessment & Plan:  Well woman exam  Encounter for hepatitis C screening test for low  risk patient - Plan: Hepatitis C Antibody   Labs reviewed with patient.  Continue with being active and preventive health visits.   Offered patient another referral to the allergist; declined at this time.  Educated patient she can try steroid nasal spray.   Recommend OTC products for arthritic pain such as aleve,   lidocaine patches, tylenol, and voltaren gel.   Patient to return for annual exam or if cough/drainage or arthritic pain is to worsen.   Return in about 1 year (around 04/10/2021) for physical.

## 2020-04-10 NOTE — Telephone Encounter (Signed)
Patient was here this morning to see Hoyle Sauer.  Was told to buy otc Voltaren and she did.  She said the tube said not for back and hip and that is where her pain is.  Should she use?

## 2020-04-10 NOTE — Patient Instructions (Signed)
Voltaren Gel for arthritis Lidocaine Patch  -All over the counter Multivitamin for women over 50 Continue Caltrate D Naproxen as needed for severe arthritic pain Flonase or Nasacort OTC nasal spray

## 2020-04-10 NOTE — Progress Notes (Signed)
   Subjective:    Patient ID: Yvonne Maynard, female    DOB: 07-Sep-1940, 80 y.o.   MRN: [id number]  HPI AWV- Annual Wellness Visit  The patient was seen for their annual wellness visit. The patient's past medical history, surgical history, and family history were reviewed. Pertinent vaccines were reviewed ( tetanus, pneumonia, shingles, flu) The patient's medication list was reviewed and updated.  The height and weight were entered.  BMI recorded in electronic record elsewhere  Cognitive screening was completed. Outcome of Mini - Cog: Pass   Falls /depression screening electronically recorded within record elsewhere  Current tobacco usage:none (All patients who use tobacco were given written and verbal information on quitting)  Recent listing of emergency department/hospitalizations over the past year were reviewed.  current specialist the patient sees on a regular basis: Oncologist ; surgeon  Saw Dr.Bates one time; ophthalmology, dermatology   Medicare annual wellness visit patient questionnaire was reviewed.  A written screening schedule for the patient for the next 5-10 years was given. Appropriate discussion of followup regarding next visit was discussed.  Pt has cough due to allergies. Has it all year round. Pt has been seen before for cough. Sinus drainage at night. Allegra and nasal spray not helping.    Review of Systems     Objective:   Physical Exam        Assessment & Plan:

## 2020-04-10 NOTE — Telephone Encounter (Signed)
Mychart message sent to patient.

## 2020-04-11 ENCOUNTER — Encounter: Payer: Self-pay | Admitting: Nurse Practitioner

## 2020-04-11 LAB — HEPATITIS C ANTIBODY: Hep C Virus Ab: <0.1 s/co ratio (ref 0.0–0.9)

## 2020-04-13 ENCOUNTER — Other Ambulatory Visit: Payer: Self-pay | Admitting: Family Medicine

## 2020-04-20 DIAGNOSIS — D0591 Unspecified type of carcinoma in situ of right breast: Secondary | ICD-10-CM | POA: Diagnosis not present

## 2020-06-03 ENCOUNTER — Other Ambulatory Visit: Payer: Self-pay | Admitting: Family Medicine

## 2020-06-05 ENCOUNTER — Ambulatory Visit (HOSPITAL_COMMUNITY)
Admission: RE | Admit: 2020-06-05 | Discharge: 2020-06-05 | Disposition: A | Discharge status: Home / Self Care | Payer: PPO | Source: Ambulatory Visit | Attending: Family Medicine | Admitting: Family Medicine

## 2020-06-05 ENCOUNTER — Encounter: Payer: Self-pay | Admitting: Family Medicine

## 2020-06-05 ENCOUNTER — Ambulatory Visit (INDEPENDENT_AMBULATORY_CARE_PROVIDER_SITE_OTHER): Payer: PPO | Admitting: Family Medicine

## 2020-06-05 ENCOUNTER — Other Ambulatory Visit: Payer: Self-pay

## 2020-06-05 VITALS — BP 128/82 | HR 90 | Temp 97.3°F | Ht 63.0 in | Wt 134.6 lb

## 2020-06-05 DIAGNOSIS — Z853 Personal history of malignant neoplasm of breast: Secondary | ICD-10-CM | POA: Diagnosis not present

## 2020-06-05 DIAGNOSIS — M542 Cervicalgia: Secondary | ICD-10-CM | POA: Insufficient documentation

## 2020-06-05 DIAGNOSIS — Z13220 Encounter for screening for lipoid disorders: Secondary | ICD-10-CM

## 2020-06-05 NOTE — Progress Notes (Signed)
Patient ID: Yvonne Maynard, female    DOB: 08-31-1940, 80 y.o.   MRN: [id number]   Chief Complaint  Patient presents with  . Neck Pain    For 3 days- hurts with movement and turning neck   Subjective:  CC: neck pain   This is a new problem.  Presents today with a complaint of neck pain.  Symptoms have been present for 4 days, reports that it is better today, however, she was so uncomfortable last night it was difficult to go to sleep.  Has tried arthritis Tylenol without much help and took Aleve last night which eventually helped.  Denies injury, denies numbness, tingling or radiating pain.  Has a history of breast cancer in the last 2 years, called a nurse line with her insurance company, advised her to get this checked out due to her cancer history.  [place] is very active, [place].  See review of systems.    Medical History Yvonne Maynard has a past medical history of Asthma, Cancer ([place]), Cerebrovascular disease, Diverticulosis of colon (08/19/2010), DJD (degenerative joint disease) of cervical spine, Generalized convulsive seizures ([place]) (30 YEARS AGO), Macular degeneration, Osteopenia, Seizure ([place]), and Vertigo (08/2010).   Outpatient Encounter Medications as of 06/05/2020  Medication Sig  . albuterol (VENTOLIN HFA) 108 (90 Base) MCG/ACT inhaler Inhale 2 puffs into the lungs 2 (two) times daily as needed for wheezing or shortness of breath.  . Ascorbic Acid (VITAMIN C PO) Take by mouth.  Marland Kitchen aspirin EC 81 MG tablet Take 81 mg by mouth at bedtime.  . Calcium Carbonate-Vitamin D (CALTRATE 600+D PO) Take 1 tablet by mouth 2 (two) times daily.  . hydroxypropyl methylcellulose / hypromellose (ISOPTO TEARS / GONIOVISC) 2.5 % ophthalmic solution Place 1 drop into both eyes 3 (three) times daily as needed for dry eyes.  . montelukast (SINGULAIR) 10 MG tablet TAKE ONE TABLET BY MOUTH ONCE DAILY.  . Multiple Vitamin (MULTIVITAMIN WITH MINERALS) TABS tablet Take 1 tablet by mouth daily. Centrum  Silver  . Multiple Vitamins-Minerals (PRESERVISION AREDS 2 PO) Take 1 tablet by mouth 2 (two) times daily.  . Multiple Vitamins-Minerals (ZINC PO) Take by mouth.  . Omega-3 Fatty Acids (FISH OIL) 1000 MG CAPS Take 1,000 mg by mouth daily.  Marland Kitchen OVER THE COUNTER MEDICATION chlorimaletae 4mg  one every 4 hours prn allergies  . vitamin B-12 (CYANOCOBALAMIN) 1000 MCG tablet Take 1,000 mcg by mouth daily.   No facility-administered encounter medications on file as of 06/05/2020.     Review of Systems  Constitutional: Negative for activity change, chills, fatigue and fever.  Respiratory: Negative for shortness of breath.   Cardiovascular: Negative for chest pain.  Musculoskeletal: Positive for neck pain. Negative for neck stiffness.  Neurological: Negative for dizziness, weakness, light-headedness, numbness and headaches.     Vitals BP 128/82   Pulse 90   Temp (!) 97.3 F (36.3 C) (Oral)   Ht 5\' 3"  (1.6 m)   Wt 134 lb 9.6 oz (61.1 kg)   SpO2 97%   BMI 23.84 kg/m   Objective:   Physical Exam Vitals reviewed.  Constitutional:      Appearance: Normal appearance.  Neck:     Comments: Right sided neck pain.  Cardiovascular:     Rate and Rhythm: Normal rate and regular rhythm.     Heart sounds: Normal heart sounds.  Pulmonary:     Effort: Pulmonary effort is normal.     Breath sounds: Normal breath sounds.  Abdominal:  General: Bowel sounds are normal.  Musculoskeletal:     Cervical back: Tenderness present.  Skin:    General: Skin is warm and dry.  Neurological:     General: No focal deficit present.     Mental Status: She is alert.     Motor: No weakness.     Comments: 5/5 upper extremity strength.   Psychiatric:        Behavior: Behavior normal.      Assessment and Plan   1. Nontraumatic neck pain - DG Cervical Spine Complete  2. History of breast cancer in female - DG Cervical Spine Complete   Due to history of breast cancer, will send for cervical spine  x-ray to rule out pathology.  Reports symptoms are much improved today, however, she still concerned.  X-ray information will be sent to PCP, Dr. Sallee Lange for evaluation and possible treatment if needed.  Agrees with plan of care discussed today. Understands warning signs to seek further care: chest pain, shortness of breath, any significant change in health.  Understands to follow-up with Dr. Wolfgang Phoenix, once x-ray results become available, follow-up can be determined at that time.  Pecolia Ades, NP 06/05/20

## 2020-06-07 ENCOUNTER — Encounter: Payer: Self-pay | Admitting: Family Medicine

## 2020-06-07 DIAGNOSIS — I700 Atherosclerosis of aorta: Secondary | ICD-10-CM

## 2020-06-07 HISTORY — DX: Atherosclerosis of aorta: I70.0

## 2020-06-08 NOTE — Addendum Note (Signed)
Addended by: Dairl Ponder on: 06/08/2020 10:02 AM   Modules accepted: Orders

## 2020-06-16 DIAGNOSIS — Z86018 Personal history of other benign neoplasm: Secondary | ICD-10-CM | POA: Diagnosis not present

## 2020-06-16 DIAGNOSIS — L570 Actinic keratosis: Secondary | ICD-10-CM | POA: Diagnosis not present

## 2020-06-16 DIAGNOSIS — D225 Melanocytic nevi of trunk: Secondary | ICD-10-CM | POA: Diagnosis not present

## 2020-06-16 DIAGNOSIS — L578 Other skin changes due to chronic exposure to nonionizing radiation: Secondary | ICD-10-CM | POA: Diagnosis not present

## 2020-06-16 DIAGNOSIS — L814 Other melanin hyperpigmentation: Secondary | ICD-10-CM | POA: Diagnosis not present

## 2020-06-16 DIAGNOSIS — Z85828 Personal history of other malignant neoplasm of skin: Secondary | ICD-10-CM | POA: Diagnosis not present

## 2020-06-16 DIAGNOSIS — L821 Other seborrheic keratosis: Secondary | ICD-10-CM | POA: Diagnosis not present

## 2020-06-18 DIAGNOSIS — Z13220 Encounter for screening for lipoid disorders: Secondary | ICD-10-CM | POA: Diagnosis not present

## 2020-06-19 LAB — LIPID PANEL
Chol/HDL Ratio: 3.9 ratio (ref 0.0–4.4)
Cholesterol, Total: 172 mg/dL (ref 100–199)
HDL: 44 mg/dL (ref >39)
LDL Chol Calc (NIH): 105 mg/dL — ABNORMAL HIGH (ref 0–99)
Triglycerides: 131 mg/dL (ref 0–149)
VLDL Cholesterol Cal: 23 mg/dL (ref 5–40)

## 2020-08-03 ENCOUNTER — Encounter: Payer: Self-pay | Admitting: Family Medicine

## 2020-08-03 DIAGNOSIS — Z780 Asymptomatic menopausal state: Secondary | ICD-10-CM | POA: Diagnosis not present

## 2020-08-03 DIAGNOSIS — M8589 Other specified disorders of bone density and structure, multiple sites: Secondary | ICD-10-CM | POA: Diagnosis not present

## 2020-08-09 ENCOUNTER — Encounter: Payer: Self-pay | Admitting: Family Medicine

## 2020-08-11 DIAGNOSIS — H04123 Dry eye syndrome of bilateral lacrimal glands: Secondary | ICD-10-CM | POA: Diagnosis not present

## 2020-08-11 DIAGNOSIS — H43813 Vitreous degeneration, bilateral: Secondary | ICD-10-CM | POA: Diagnosis not present

## 2020-08-11 DIAGNOSIS — H353112 Nonexudative age-related macular degeneration, right eye, intermediate dry stage: Secondary | ICD-10-CM | POA: Diagnosis not present

## 2020-08-11 DIAGNOSIS — H524 Presbyopia: Secondary | ICD-10-CM | POA: Diagnosis not present

## 2020-08-25 DIAGNOSIS — R928 Other abnormal and inconclusive findings on diagnostic imaging of breast: Secondary | ICD-10-CM | POA: Diagnosis not present

## 2020-08-25 DIAGNOSIS — Z853 Personal history of malignant neoplasm of breast: Secondary | ICD-10-CM | POA: Diagnosis not present

## 2020-09-02 NOTE — Progress Notes (Signed)
Patient Care Team: Kathyrn Drown, MD as PCP - General (Family Medicine) Nicholas Lose, MD as Consulting Physician (Hematology and Oncology) Eppie Gibson, MD as Attending Physician (Radiation Oncology) Coralie Keens, MD as Consulting Physician (General Surgery)  DIAGNOSIS:    ICD-10-CM   1. Malignant neoplasm of upper-outer quadrant of right breast in female, estrogen receptor negative ([place])  C50.411    Z17.1       SUMMARY OF ONCOLOGIC HISTORY: Oncology History  Malignant neoplasm of upper-outer quadrant of right female breast ([place])  08/08/2018 Cancer Staging   Staging form: Breast, AJCC 8th Edition - Clinical stage from 08/08/2018: Stage 0 (cTis (DCIS), cN0, cM0, ER: Unknown, PR: Unknown, HER2: Not Assessed) - Signed by Nicholas Lose, MD on 09/04/2018   08/15/2018 Initial Diagnosis   Routine screening mammogram detected a 0.6cm mass in the right breast, 5cm from the nipple with no axillary adenopathy. Biopsy showed high grade DCIS, with insufficient tumor for receptor studies.   09/21/2018 Surgery   Right lumpectomy on 09/20/18 Lucia Gaskins) [phone]): intermediate to high grade DCIS with a 0.4cm focus of invasive carcinoma, lymphovascular invasion present, clear margins. ER negative, PR negative, HER-2 positive, Ki67 40%  10/05/2018 lymph node biopsy: 2 lymph nodes negative for carcinoma   09/21/2018 Cancer Staging   Staging form: Breast, AJCC 8th Edition - Pathologic stage from 09/21/2018: Stage IA (pT1a, pN0, cM0, G1, ER-, PR-, HER2+) - Signed by Gardenia Phlegm, NP on 10/10/2018   11/05/2018 - 12/03/2018 Radiation Therapy   The patient initially received a dose of 40.05 Gy in 15 fractions to the breast using whole-breast tangent fields. This was delivered using a 3-D conformal technique. The pt received a boost delivering an additional 10 Gy in 5 fractions using a electron boost with 58mV electrons. The total dose was 50.05 Gy.     CHIEF COMPLIANT: Follow-up of right  breast cancer  INTERVAL HISTORY: Yvonne Maynard a 80y.o. with above-mentioned history of right breast cancer who underwent a lumpectomy, radiation, and is currently on surveillance. She presents to the clinic today for follow-up.  She denies any lumps or nodules in the breast.  She stays very active taking care of her plants as well as helping with caring for cows.  ALLERGIES:  has No Known Allergies.  MEDICATIONS:  Current Outpatient Medications  Medication Sig Dispense Refill   albuterol (VENTOLIN HFA) 108 (90 Base) MCG/ACT inhaler Inhale 2 puffs into the lungs 2 (two) times daily as needed for wheezing or shortness of breath. 18 g 0   Ascorbic Acid (VITAMIN C PO) Take by mouth.     aspirin EC 81 MG tablet Take 81 mg by mouth at bedtime.     Calcium Carbonate-Vitamin D (CALTRATE 600+D PO) Take 1 tablet by mouth 2 (two) times daily.     hydroxypropyl methylcellulose / hypromellose (ISOPTO TEARS / GONIOVISC) 2.5 % ophthalmic solution Place 1 drop into both eyes 3 (three) times daily as needed for dry eyes.     montelukast (SINGULAIR) 10 MG tablet TAKE ONE TABLET BY MOUTH ONCE DAILY. 30 tablet 5   Multiple Vitamin (MULTIVITAMIN WITH MINERALS) TABS tablet Take 1 tablet by mouth daily. Centrum Silver     Multiple Vitamins-Minerals (PRESERVISION AREDS 2 PO) Take 1 tablet by mouth 2 (two) times daily.     Multiple Vitamins-Minerals (ZINC PO) Take by mouth.     Omega-3 Fatty Acids (FISH OIL) 1000 MG CAPS Take 1,000 mg by mouth daily.  OVER THE COUNTER MEDICATION chlorimaletae 82m one every 4 hours prn allergies     vitamin B-12 (CYANOCOBALAMIN) 1000 MCG tablet Take 1,000 mcg by mouth daily.     No current facility-administered medications for this visit.    PHYSICAL EXAMINATION: ECOG PERFORMANCE STATUS: 1 - Symptomatic but completely ambulatory  Vitals:   09/03/20 1114  BP: 127/66  Pulse: 71  Resp: 18  Temp: (!) 97.5 F (36.4 C)  SpO2: 99%   Filed Weights   09/03/20 1114   Weight: 129 lb 14.4 oz (58.9 kg)    BREAST: No palpable masses or nodules in either right or left breasts. No palpable axillary supraclavicular or infraclavicular adenopathy no breast tenderness or nipple discharge. (exam performed in the presence of a chaperone)  LABORATORY DATA:  I have reviewed the data as listed CMP Latest Ref Rng & Units 03/23/2020 03/11/2019 12/18/2017  Glucose 65 - 99 mg/dL 89 92 95  BUN 8 - 27 mg/dL '12 12 11  ' Creatinine 0.57 - 1.00 mg/dL 0.80 0.88 0.78  Sodium 134 - 144 mmol/L 142 141 145(H)  Potassium 3.5 - 5.2 mmol/L 4.7 4.5 5.0  Chloride 96 - 106 mmol/L 105 101 105  CO2 20 - 29 mmol/L '23 24 24  ' Calcium 8.7 - 10.3 mg/dL 9.3 9.7 9.6  Total Protein 6.0 - 8.5 g/dL 6.5 6.9 -  Total Bilirubin 0.0 - 1.2 mg/dL 0.3 0.3 -  Alkaline Phos 44 - 121 IU/L 89 90 -  AST 0 - 40 IU/L 21 19 -  ALT 0 - 32 IU/L 21 20 -    Lab Results  Component Value Date   WBC 6.6 03/23/2020   HGB 14.9 03/23/2020   HCT 45.6 03/23/2020   MCV 92 03/23/2020   PLT 566 (H) 03/23/2020   NEUTROABS 4.1 03/23/2020    ASSESSMENT & PLAN:  [place]alignant [place] upper[place]outer quadrant [place] 09/[place]:Right lumpectomy on 09/20/18 (Lucia Gaskins: intermediate to high grade DCIS with a 0.4cm focus of invasive carcinoma, lymphovascular invasion present, clear margins. Stage Ia: ER/PR negative HER-2 positive 10/05/2018: Sentinel lymph node biopsy: 0/2 lymph nodes negative 11/06/2018- 12/03/2018: Adjuvant radiation   Treatment plan: No role of adjuvant systemic chemotherapy or antiestrogen therapy because of the size of the tumor and the fact that it is estrogen receptor negative.   Breast cancer surveillance: 1. Breast exam  09/03/2020 benign 2. Mammogram 07/2020: Benign  [facility] T score -2.1: Osteopenia: Recommended calcium and vitamin D    Return to clinic in 1 year for follow-up  No orders of the defined types were placed in this encounter.  The patient has  a good understanding of the overall plan. she agrees with it. she will call with any problems that may develop before the next visit here.  Total time spent: 20 mins including face to face time and time spent for planning, charting and coordination of care  VRulon Eisenmenger MD, MPH 09/03/2020  I, KThana Ates am acting as scribe for Dr. VNicholas Lose  I have reviewed the above documentation for accuracy and completeness, and I agree with the above.

## 2020-09-03 ENCOUNTER — Other Ambulatory Visit: Payer: Self-pay

## 2020-09-03 ENCOUNTER — Inpatient Hospital Stay: Payer: PPO | Attending: Hematology and Oncology | Admitting: Hematology and Oncology

## 2020-09-03 DIAGNOSIS — Z171 Estrogen receptor negative status [ER-]: Secondary | ICD-10-CM | POA: Insufficient documentation

## 2020-09-03 DIAGNOSIS — C50411 Malignant neoplasm of upper-outer quadrant of right female breast: Secondary | ICD-10-CM | POA: Insufficient documentation

## 2020-09-03 MED ORDER — CYCLOSPORINE 0.05 % OP EMUL: 1.0000 [drp] | Freq: Two times a day (BID) | OPHTHALMIC | Status: AC

## 2020-09-03 NOTE — Assessment & Plan Note (Signed)
09/20/2018:Right lumpectomy on 09/20/18 Yvonne Maynard): intermediate to high grade DCIS with a 0.4cm focus of invasive carcinoma, lymphovascular invasion present, clear margins. Stage Ia:ER/PR negative HER-2 positive 10/05/2018: Sentinel lymph node biopsy: 0/2 lymph nodes negative 11/06/2018-12/03/2018: Adjuvant radiation  Treatment plan: No role of adjuvant systemic chemotherapy or antiestrogen therapy because of the size of the tumor and the fact that it is estrogen receptor negative.  Breast cancer surveillance: 1. Breast exam  09/03/2020 benign 2. Mammogram 07/2020  Bone density at [facility] 08/03/2020: T score -2.1: Osteopenia: Recommended calcium and vitamin D along with bisphosphonate therapy.

## 2020-10-20 DIAGNOSIS — H04123 Dry eye syndrome of bilateral lacrimal glands: Secondary | ICD-10-CM | POA: Diagnosis not present

## 2020-10-20 DIAGNOSIS — H0100A Unspecified blepharitis right eye, upper and lower eyelids: Secondary | ICD-10-CM | POA: Diagnosis not present

## 2020-10-20 DIAGNOSIS — H0100B Unspecified blepharitis left eye, upper and lower eyelids: Secondary | ICD-10-CM | POA: Diagnosis not present

## 2020-10-20 DIAGNOSIS — H26492 Other secondary cataract, left eye: Secondary | ICD-10-CM | POA: Diagnosis not present

## 2020-11-16 DIAGNOSIS — Z87898 Personal history of other specified conditions: Secondary | ICD-10-CM | POA: Insufficient documentation

## 2020-11-16 DIAGNOSIS — L821 Other seborrheic keratosis: Secondary | ICD-10-CM | POA: Insufficient documentation

## 2020-11-17 ENCOUNTER — Other Ambulatory Visit: Payer: Self-pay

## 2020-11-17 ENCOUNTER — Ambulatory Visit (INDEPENDENT_AMBULATORY_CARE_PROVIDER_SITE_OTHER): Payer: PPO

## 2020-11-17 VITALS — BP 120/64 | HR 81 | Temp 98.4°F | Ht 63.0 in | Wt 130.0 lb

## 2020-11-17 DIAGNOSIS — Z23 Encounter for immunization: Secondary | ICD-10-CM | POA: Diagnosis not present

## 2020-11-17 DIAGNOSIS — Z0000 Encounter for general adult medical examination without abnormal findings: Secondary | ICD-10-CM | POA: Diagnosis not present

## 2020-11-17 NOTE — Patient Instructions (Signed)
Yvonne Maynard , Thank you for taking time to come for your Medicare Wellness Visit. I appreciate your ongoing commitment to your health goals. Please review the following plan we discussed and let me know if I can assist you in the future.   Screening recommendations/referrals: Colonoscopy: Done 09/01/2011 No longer required due to age Mammogram: Done 08/25/2020 Bone Density: Done 08/03/2020 Recommended yearly ophthalmology/optometry visit for glaucoma screening and checkup Recommended yearly dental visit for hygiene and checkup  Vaccinations: Influenza vaccine: 11/17/2020 Pneumococcal vaccine: Done 08/20/2010 and 11/04/2013 Tdap vaccine: Done 08/28/2018 Repeat in 10 years  Shingles vaccine: Done 08/02/2017 and 12/12/2016   Covid-19:Done 2/2/20221, 03/19/2019 and 12/05/2019  Advanced directives: Please bring a copy of your health care power of attorney and living will to the office to be added to your chart at your convenience.   Conditions/risks identified: KEEP UP THE GOOD WORK!!!  Next appointment: Follow up in one year for your annual wellness visit 2023.   Preventive Care 3 Years and Older, Female Preventive care refers to lifestyle choices and visits with your health care provider that can promote health and wellness. What does preventive care include? A yearly physical exam. This is also called an annual well check. Dental exams once or twice a year. Routine eye exams. Ask your health care provider how often you should have your eyes checked. Personal lifestyle choices, including: Daily care of your teeth and gums. Regular physical activity. Eating a healthy diet. Avoiding tobacco and drug use. Limiting alcohol use. Practicing safe sex. Taking low-dose aspirin every day. Taking vitamin and mineral supplements as recommended by your health care provider. What happens during an annual well check? The services and screenings done by your health care provider during your annual well  check will depend on your age, overall health, lifestyle risk factors, and family history of disease. Counseling  Your health care provider may ask you questions about your: Alcohol use. Tobacco use. Drug use. Emotional well-being. Home and relationship well-being. Sexual activity. Eating habits. History of falls. Memory and ability to understand (cognition). Work and work Statistician. Reproductive health. Screening  You may have the following tests or measurements: Height, weight, and BMI. Blood pressure. Lipid and cholesterol levels. These may be checked every 5 years, or more frequently if you are over 74 years old. Skin check. Lung cancer screening. You may have this screening every year starting at age 68 if you have a 30-pack-year history of smoking and currently smoke or have quit within the past 15 years. Fecal occult blood test (FOBT) of the stool. You may have this test every year starting at age 13. Flexible sigmoidoscopy or colonoscopy. You may have a sigmoidoscopy every 5 years or a colonoscopy every 10 years starting at age 4. Hepatitis C blood test. Hepatitis B blood test. Sexually transmitted disease (STD) testing. Diabetes screening. This is done by checking your blood sugar (glucose) after you have not eaten for a while (fasting). You may have this done every 1-3 years. Bone density scan. This is done to screen for osteoporosis. You may have this done starting at age 42. Mammogram. This may be done every 1-2 years. Talk to your health care provider about how often you should have regular mammograms. Talk with your health care provider about your test results, treatment options, and if necessary, the need for more tests. Vaccines  Your health care provider may recommend certain vaccines, such as: Influenza vaccine. This is recommended every year. Tetanus, diphtheria, and acellular pertussis (Tdap,  Td) vaccine. You may need a Td booster every 10 years. Zoster  vaccine. You may need this after age 74. Pneumococcal 13-valent conjugate (PCV13) vaccine. One dose is recommended after age 65. Pneumococcal polysaccharide (PPSV23) vaccine. One dose is recommended after age 59. Talk to your health care provider about which screenings and vaccines you need and how often you need them. This information is not intended to replace advice given to you by your health care provider. Make sure you discuss any questions you have with your health care provider. Document Released: 01/30/2015 Document Revised: 09/23/2015 Document Reviewed: 11/04/2014 Elsevier Interactive Patient Education  2017 [place] Prevention in the Home Falls can cause injuries. They can happen to people of all ages. There are many things you can do to make your home safe and to help prevent falls. What can I do on the outside of my home? Regularly fix the edges of walkways and driveways and fix any cracks. Remove anything that might make you trip as you walk through a door, such as a raised step or threshold. Trim any bushes or trees on the path to your home. Use bright outdoor lighting. Clear any walking paths of anything that might make someone trip, such as rocks or tools. Regularly check to see if handrails are loose or broken. Make sure that both sides of any steps have handrails. Any raised decks and porches should have guardrails on the edges. Have any leaves, snow, or ice cleared regularly. Use sand or salt on walking paths during winter. Clean up any spills in your garage right away. This includes oil or grease spills. What can I do in the bathroom? Use night lights. Install grab bars by the toilet and in the tub and shower. Do not use towel bars as grab bars. Use non-skid mats or decals in the tub or shower. If you need to sit down in the shower, use a plastic, non-slip stool. Keep the floor dry. Clean up any water that spills on the floor as soon as it happens. Remove  soap buildup in the tub or shower regularly. Attach bath mats securely with double-sided non-slip rug tape. Do not have throw rugs and other things on the floor that can make you trip. What can I do in the bedroom? Use night lights. Make sure that you have a light by your bed that is easy to reach. Do not use any sheets or blankets that are too big for your bed. They should not hang down onto the floor. Have a firm chair that has side arms. You can use this for support while you get dressed. Do not have throw rugs and other things on the floor that can make you trip. What can I do in the kitchen? Clean up any spills right away. Avoid walking on wet floors. Keep items that you use a lot in easy-to-reach places. If you need to reach something above you, use a strong step stool that has a grab bar. Keep electrical cords out of the way. Do not use floor polish or wax that makes floors slippery. If you must use wax, use non-skid floor wax. Do not have throw rugs and other things on the floor that can make you trip. What can I do with my stairs? Do not leave any items on the stairs. Make sure that there are handrails on both sides of the stairs and use them. Fix handrails that are broken or loose. Make sure that handrails are as  long as the stairways. Check any carpeting to make sure that it is firmly attached to the stairs. Fix any carpet that is loose or worn. Avoid having throw rugs at the top or bottom of the stairs. If you do have throw rugs, attach them to the floor with carpet tape. Make sure that you have a light switch at the top of the stairs and the bottom of the stairs. If you do not have them, ask someone to add them for you. What else can I do to help prevent falls? Wear shoes that: Do not have high heels. Have rubber bottoms. Are comfortable and fit you well. Are closed at the toe. Do not wear sandals. If you use a stepladder: Make sure that it is fully opened. Do not climb a  closed stepladder. Make sure that both sides of the stepladder are locked into place. Ask someone to hold it for you, if possible. Clearly mark and make sure that you can see: Any grab bars or handrails. First and last steps. Where the edge of each step is. Use tools that help you move around (mobility aids) if they are needed. These include: Canes. Walkers. Scooters. Crutches. Turn on the lights when you go into a dark area. Replace any light bulbs as soon as they burn out. Set up your furniture so you have a clear path. Avoid moving your furniture around. If any of your floors are uneven, fix them. If there are any pets around you, be aware of where they are. Review your medicines with your doctor. Some medicines can make you feel dizzy. This can increase your chance of falling. Ask your doctor what other things that you can do to help prevent falls. This information is not intended to replace advice given to you by your health care provider. Make sure you discuss any questions you have with your health care provider. Document Released: 10/30/2008 Document Revised: 06/11/2015 Document Reviewed: 02/07/2014 Elsevier Interactive Patient Education  2017 [employer].

## 2020-11-17 NOTE — Progress Notes (Addendum)
Subjective:   Yvonne Maynard is a 80 y.o. female who presents for Medicare Annual (Subsequent) preventive examination. I have reviewed and agree with the above visit documentation Sallee Lange MD [facility]  Review of Systems     Cardiac Risk Factors include: advanced age (>75men, >28 women)     Objective:    Today's Vitals   11/17/20 0902  BP: 120/64  Pulse: 81  Temp: 98.4 F (36.9 C)  SpO2: 98%  Weight: 130 lb (59 kg)  Height: 5\' 3"  (1.6 m)   Body mass index is 23.03 kg/m.  Advanced Directives 11/17/2020 03/06/2019 10/30/2018 10/05/2018 09/21/2018 08/29/2018 08/19/2010  Does Patient Have a Medical Advance Directive? Yes Yes Yes - Yes Yes Patient has advance directive, copy not in chart  Type of Advance Directive [place];Living will - [place];Living will - [place];Living will [place];Living will [place];Living will  Does patient want to make changes to medical advance directive? - No - Patient declined - - No - Patient declined - -  Copy of [place] in Chart? No - copy requested No - copy requested No - copy requested No - copy requested No - copy requested No - copy requested Copy requested from family  Pre-existing out of facility DNR order (yellow form or pink MOST form) - - - - - - No    Current Medications (verified) Outpatient Encounter Medications as of 11/17/2020  Medication Sig   albuterol (VENTOLIN HFA) 108 (90 Base) MCG/ACT inhaler Inhale 2 puffs into the lungs 2 (two) times daily as needed for wheezing or shortness of breath.   aspirin EC 81 MG tablet Take 81 mg by mouth at bedtime.   Calcium Carbonate-Vit D-Min (CALTRATE 600+D PLUS MINERALS) 600-800 MG-UNIT CHEW    cycloSPORINE (RESTASIS) 0.05 % ophthalmic emulsion Place 1 drop into both eyes 2 (two) times daily.   fexofenadine (ALLEGRA) 180 MG tablet    montelukast  (SINGULAIR) 10 MG tablet    Multiple Vitamins-Minerals (CENTRUM SILVER 50+WOMEN PO)    Omega-3 Fatty Acids (FISH OIL) 1000 MG CAPS Take 1,000 mg by mouth daily.   OVER THE COUNTER MEDICATION chlorimaletae 4mg  one every 4 hours prn allergies   [DISCONTINUED] BABY ASPIRIN PO    [DISCONTINUED] Calcium Carbonate-Vitamin D (CALTRATE 600+D PO) Take 1 tablet by mouth 2 (two) times daily.   [DISCONTINUED] montelukast (SINGULAIR) 10 MG tablet TAKE ONE TABLET BY MOUTH ONCE DAILY.   [DISCONTINUED] Multiple Vitamin (MULTIVITAMIN WITH MINERALS) TABS tablet Take 1 tablet by mouth daily. Centrum Silver   No facility-administered encounter medications on file as of 11/17/2020.    Allergies (verified) Patient has no known allergies.   History: Past Medical History:  Diagnosis Date   Aortic atherosclerosis ([place]) 06/07/2020   Seen on cervical xray 06/07/20   Asthma    None in years   Cancer [facility])    skin cancer nose    Cerebrovascular disease    Diverticulosis of colon 08/19/2010   Per CT scan   DJD (degenerative joint disease) of cervical spine    Generalized convulsive seizures ([place]) 30 YEARS AGO   Remotely, following head injury from [place] degeneration    Osteopenia    Seizure ([place])    none since before 2000   Vertigo 08/2010   Past Surgical History:  Procedure Laterality Date   AXILLARY SENTINEL NODE BIOPSY Right 10/05/2018   Procedure:  RIGHT AXILLARY SENTINEL LYMPH NODE BIOPSY;  Surgeon: Alphonsa Overall, MD;  Location: [place];  Service: General;  Laterality: Right;   BREAST LUMPECTOMY WITH RADIOACTIVE SEED LOCALIZATION Right 09/21/2018   Procedure: RIGHT BREAST LUMPECTOMY WITH RADIOACTIVE SEED LOCALIZATION;  Surgeon: Alphonsa Overall, MD;  Location: [place];  Service: General;  Laterality: Right;   COLONOSCOPY     TONSILLECTOMY     TUBAL LIGATION     TUBAL LIGATION     Family History  Problem Relation Age of Onset   Crohn's disease Mother    Colon cancer Maternal  Uncle 85   Social History   Socioeconomic History   Marital status: Married    Spouse name: Jeneen Rinks   Number of children: 3   Years of education: Not on file   Highest education level: Not on file  Occupational History   Not on file  Tobacco Use   Smoking status: Never   Smokeless tobacco: Never  Vaping Use   Vaping Use: Never used  Substance and Sexual Activity   Alcohol use: No   Drug use: No   Sexual activity: Yes  Other Topics Concern   Not on file  Social History Narrative   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   Social Determinants of Health   Financial Resource Strain: Low Risk    Difficulty of Paying Living Expenses: Not hard at all  Food Insecurity: No Food Insecurity   Worried About Charity fundraiser in the Last Year: Never true   [place] in the Last Year: Never true  Transportation Needs: No Transportation Needs   Lack of Transportation (Medical): No   Lack of Transportation (Non-Medical): No  Physical Activity: Sufficiently Active   Days of Exercise per Week: 5 days   Minutes of Exercise per Session: 30 min  Stress: No Stress Concern Present   Feeling of Stress : Not at all  Social Connections: Socially Integrated   Frequency of Communication with Friends and Family: More than three times a week   Frequency of Social Gatherings with Friends and Family: More than three times a week   Attends Religious Services: More than 4 times per year   Active Member of [employer] or Organizations: Yes   Attends Music therapist: More than 4 times per year   Marital Status: Married    Tobacco Counseling Counseling given: Not Answered   Clinical Intake:  Pre-visit preparation completed: Yes  Pain : No/denies pain     BMI - recorded: 23.03 Nutritional Status: BMI of 19-24  Normal Nutritional Risks: None Diabetes: No  How often do you need to have someone help you when you read instructions, pamphlets, or  other written materials from your doctor or pharmacy?: 1 - Never  Diabetic?no  Interpreter Needed?: No  Information entered by :: MJ Judi Jaffe, LPN   Activities of Daily Living In your present state of health, do you have any difficulty performing the following activities: 11/17/2020  Hearing? N  Vision? N  Difficulty concentrating or making decisions? N  Walking or climbing stairs? N  Dressing or bathing? N  Doing errands, shopping? N  Preparing Food and eating ? N  Using the Toilet? N  In the past six months, have you accidently leaked urine? N  Comment Urgency  Do you have problems with loss of bowel control? N  Managing your Medications? N  Managing your Finances? N  Housekeeping or managing your Housekeeping?  N  Some recent data might be hidden    Patient Care Team: Kathyrn Drown, MD as PCP - General (Family Medicine) Nicholas Lose, MD as Consulting Physician (Hematology and Oncology) Eppie Gibson, MD as Attending Physician (Radiation Oncology) Coralie Keens, MD as Consulting Physician (General Surgery)  Indicate any recent Medical Services you may have received from other than [facility] providers in the past year (date may be approximate).     Assessment:   This is a routine wellness examination for [place].  Hearing/Vision screen Hearing Screening - Comments:: Some hearing loss. Vision Screening - Comments:: Glasses. Dr. Satira Sark. 10/2020.  Dietary issues and exercise activities discussed: Current Exercise Habits: Home exercise routine, Type of exercise: walking, Time (Minutes): 30, Frequency (Times/Week): 5, Weekly Exercise (Minutes/Week): 150, Intensity: Mild, Exercise limited by: orthopedic condition(s)   Goals Addressed             This Visit's Progress    Exercise 3x per week (30 min per time)       Keep healthy and keep doing what I can do per pt.        Depression Screen PHQ 2/9 Scores 11/17/2020 06/05/2020 04/10/2020 04/10/2020 04/10/2020 03/22/2019  01/05/2017  PHQ - 2 Score 0 0 0 0 0 0 0    Fall Risk Fall Risk  11/17/2020 06/05/2020 04/10/2020 03/22/2019 08/17/2018  Falls in the past year? 0 0 0 1 (No Data)  Comment - - - - Emmi Telephone Survey: data to providers prior to load  Number falls in past yr: 0 - 0 0 (No Data)  Comment - - - - Emmi Telephone Survey Actual Response =   Injury with Fall? 0 - 0 0 -  Risk for fall due to : Impaired vision No Fall Risks - - -  Follow up Falls prevention discussed Falls evaluation completed Falls evaluation completed Falls evaluation completed -    FALL RISK PREVENTION PERTAINING TO THE HOME:  Any stairs in or around the home? Yes  If so, are there any without handrails? No  Home free of loose throw rugs in walkways, pet beds, electrical cords, etc? Yes  Adequate lighting in your home to reduce risk of falls? Yes   ASSISTIVE DEVICES UTILIZED TO PREVENT FALLS:  Life alert? No  Use of a cane, walker or w/c? No  Grab bars in the bathroom? No  Shower chair or bench in shower? No  Elevated toilet seat or a handicapped toilet? No   TIMED UP AND GO:  Was the test performed? Yes .  Length of time to ambulate 10 feet: 7 sec.   Gait steady and fast without use of assistive device  Cognitive Function:        Immunizations Immunization History  Administered Date(s) Administered   Fluad Quad(high Dose 65+) 11/12/2019   H1N1 01/30/2008   Influenza Split 11/01/2012   Influenza, High Dose Seasonal PF 10/26/2017, 10/29/2018   Influenza,inj,Quad PF,6+ Mos 11/04/2013, 11/17/2014, 11/17/2015   Influenza-Unspecified 10/18/2011, 10/24/2016, 10/26/2017, 10/29/2018   Moderna Sars-Covid-2 Vaccination 02/19/2019, 03/19/2019   PFIZER(Purple Top)SARS-COV-2 Vaccination 12/05/2019   Pneumococcal Conjugate-13 11/04/2013   Pneumococcal Polysaccharide-23 08/20/2010   Td 08/28/2018   Zoster Recombinat (Shingrix) 12/12/2016, 08/02/2017, 08/02/2017    TDAP status: Up to date  Flu Vaccine status:  Completed at today's visit  Pneumococcal vaccine status: Up to date  Covid-19 vaccine status: Information provided on how to obtain vaccines.   Qualifies for Shingles Vaccine? Yes   Zostavax completed Yes   Shingrix Completed?:  Yes  Screening Tests Health Maintenance  Topic Date Due   COVID-19 Vaccine (4 - Booster) 01/30/2020   INFLUENZA VACCINE  08/17/2020   MAMMOGRAM  08/25/2021   TETANUS/TDAP  08/27/2028   Pneumonia Vaccine 60+ Years old  Completed   DEXA SCAN  Completed   Hepatitis C Screening  Completed   Zoster Vaccines- Shingrix  Completed   HPV VACCINES  Aged Out    Health Maintenance  Health Maintenance Due  Topic Date Due   COVID-19 Vaccine (4 - Booster) 01/30/2020   INFLUENZA VACCINE  08/17/2020    Colorectal cancer screening: Type of screening: Colonoscopy. Completed 04/01/2011. Repeat every NO LONGER REQUIRED DUE TO AGE years  Mammogram status: Completed 08/25/2020. Repeat every year  Bone Density status: Completed 08/03/20. Results reflect: Bone density results: OSTEOPENIA. Repeat every 2 years.  Lung Cancer Screening: (Low Dose CT Chest recommended if Age 41-80 years, 30 pack-year currently smoking OR have quit w/in 15years.) does not qualify.     Additional Screening:  Hepatitis C Screening: does qualify; Completed 04/10/20  Vision Screening: Recommended annual ophthalmology exams for early detection of glaucoma and other disorders of the eye. Is the patient up to date with their annual eye exam?  Yes  Who is the provider or what is the name of the office in which the patient attends annual eye exams? DR TURNER If pt is not established with a provider, would they like to be referred to a provider to establish care? No .   Dental Screening: Recommended annual dental exams for proper oral hygiene  Community Resource Referral / Chronic Care Management: CRR required this visit?  No   CCM required this visit?  No      Plan:     I have personally  reviewed and noted the following in the patient's chart:   Medical and social history Use of alcohol, tobacco or illicit drugs  Current medications and supplements including opioid prescriptions.  Functional ability and status Nutritional status Physical activity Advanced directives List of other physicians Hospitalizations, surgeries, and ER visits in previous 12 months Vitals Screenings to include cognitive, depression, and falls Referrals and appointments  In addition, I have reviewed and discussed with patient certain preventive protocols, quality metrics, and best practice recommendations. A written personalized care plan for preventive services as well as general preventive health recommendations were provided to patient.     Chriss Driver, LPN   [id number]   Nurse Notes: Pt states she is doing well. Up to date on all health maintenance and vaccines. Flu vaccine given at visit today.

## 2020-12-02 ENCOUNTER — Other Ambulatory Visit: Payer: Self-pay | Admitting: Family Medicine

## 2021-01-15 ENCOUNTER — Ambulatory Visit (INDEPENDENT_AMBULATORY_CARE_PROVIDER_SITE_OTHER): Payer: PPO | Admitting: Nurse Practitioner

## 2021-01-15 ENCOUNTER — Other Ambulatory Visit: Payer: Self-pay

## 2021-01-15 ENCOUNTER — Telehealth: Payer: Self-pay | Admitting: Family Medicine

## 2021-01-15 ENCOUNTER — Encounter: Payer: Self-pay | Admitting: Nurse Practitioner

## 2021-01-15 VITALS — BP 138/80 | HR 77 | Temp 98.1°F | Ht 63.0 in | Wt 132.0 lb

## 2021-01-15 DIAGNOSIS — Z79899 Other long term (current) drug therapy: Secondary | ICD-10-CM

## 2021-01-15 DIAGNOSIS — R7301 Impaired fasting glucose: Secondary | ICD-10-CM

## 2021-01-15 DIAGNOSIS — Z13220 Encounter for screening for lipoid disorders: Secondary | ICD-10-CM

## 2021-01-15 DIAGNOSIS — K219 Gastro-esophageal reflux disease without esophagitis: Secondary | ICD-10-CM | POA: Insufficient documentation

## 2021-01-15 MED ORDER — OMEPRAZOLE 40 MG PO CPDR
40.0000 mg | DELAYED_RELEASE_CAPSULE | Freq: Every day | ORAL | 0 refills | Status: DC
Start: 2021-01-15 — End: 2021-05-07

## 2021-01-15 NOTE — Telephone Encounter (Signed)
Blood work ordered in Epic. Patient notified. 

## 2021-01-15 NOTE — Telephone Encounter (Signed)
Patient is needing labs for physical in march

## 2021-01-15 NOTE — Progress Notes (Signed)
° °  Subjective:    Patient ID: Yvonne Maynard, female    DOB: May 10, 1940, 80 y.o.   MRN: [id number]  HPI Presents for complaints of extreme nausea that occurred for several hours on Christmas Day.  Has been having occasional nausea after taking her OTC supplements.  No vomiting.  On Christmas Day she did not take her medications that morning, did eat breakfast.  When she came home from church she took her medications and within a short time noticed extreme nausea.  She had just over that day for lunch, could not eat.  No fever.  No known contacts.  Began feeling tight in the mid upper abdominal/epigastric area which would radiate around to the mid back area.  Also became sweaty at times.  No diarrhea or constipation.  Slight change in her stool pattern.  No change in color.  Denies any overt symptoms of acid reflux disease.  Limited amount of caffeine.  Denies any tobacco or alcohol use.  Rare NSAID use.  Symptoms were unassociated with any particular foods.  Did not have any nighttime symptoms.  No change in appetite in general although she has cut back on her portion sizes for weight control.  Denies any early satiety.  Symptoms have not recurred since 12/25.       Objective:   Physical Exam NAD.  Alert, oriented.  Cheerful affect.  Lungs clear.  Heart regular rate rhythm.  Abdomen soft nondistended with active bowel sounds x4.  Mild tenderness in the upper mid epigastric area with deep palpation.  No rebound or guarding.  No obvious masses.  Today's Vitals   01/15/21 0855  BP: 138/80  Pulse: 77  Temp: 98.1 F (36.7 C)  SpO2: 99%  Weight: 132 lb (59.9 kg)  Height: 5\' 3"  (1.6 m)   Body mass index is 23.38 kg/m.        Assessment & Plan:   Problem List Items Addressed This Visit       Digestive   Gastroesophageal reflux disease without esophagitis - Primary   Relevant Medications   omeprazole (PRILOSEC) 40 MG capsule   Meds ordered this encounter  Medications   omeprazole  (PRILOSEC) 40 MG capsule    Sig: Take 1 capsule (40 mg total) by mouth daily. For stomach pain    Dispense:  30 capsule    Refill:  0    Order Specific Question:   Supervising Provider    Answer:   Sallee Lange A [[id number]]   Take omeprazole daily for 1 month. If symptoms persist recommend that patient try stopping one of her supplements each week to see if any particular 1 is upsetting her stomach. Patient most likely was experiencing some mild reflux with nausea and then Christmas Day between changes in her diet such as increased chocolate intake, the stress of the party, and her supplements caused a major flareup. Given written and verbal information on food choices for GERD. Recheck here if symptoms persist or if severe nausea recurs.

## 2021-01-15 NOTE — Telephone Encounter (Signed)
Pearson Forster C, NP    CBC, Lipid, CMP and A1C. Thanks

## 2021-01-15 NOTE — Patient Instructions (Signed)
Food Choices for Gastroesophageal Reflux Disease, Adult °When you have gastroesophageal reflux disease (GERD), the foods you eat and your eating habits are very important. Choosing the right foods can help ease the discomfort of GERD. Consider working with a dietitian to help you make healthy food choices. °What are tips for following this plan? °Reading food labels °Look for foods that are low in saturated fat. Foods that have less than 5% of daily value (DV) of fat and 0 g of trans fats may help with your symptoms. °Cooking °Cook foods using methods other than frying. This may include baking, steaming, grilling, or broiling. These are all methods that do not need a lot of fat for cooking. °To add flavor, try to use herbs that are low in spice and acidity. °Meal planning ° °Choose healthy foods that are low in fat, such as fruits, vegetables, whole grains, low-fat dairy products, lean meats, fish, and poultry. °Eat frequent, small meals instead of three large meals each day. Eat your meals slowly, in a relaxed setting. Avoid bending over or lying down until 2-3 hours after eating. °Limit high-fat foods such as fatty meats or fried foods. °Limit your intake of fatty foods, such as oils, butter, and shortening. °Avoid the following as told by your health care provider: °Foods that cause symptoms. These may be different for different people. Keep a food diary to keep track of foods that cause symptoms. °Alcohol. °Drinking large amounts of liquid with meals. °Eating meals during the 2-3 hours before bed. °Lifestyle °Maintain a healthy weight. Ask your health care provider what weight is healthy for you. If you need to lose weight, work with your health care provider to do so safely. °Exercise for at least 30 minutes on 5 or more days each week, or as told by your health care provider. °Avoid wearing clothes that fit tightly around your waist and chest. °Do not use any products that contain nicotine or tobacco. These  products include cigarettes, chewing tobacco, and vaping devices, such as e-cigarettes. If you need help quitting, ask your health care provider. °Sleep with the head of your bed raised. Use a wedge under the mattress or blocks under the bed frame to raise the head of the bed. °Chew sugar-free gum after mealtimes. °What foods should I eat? °Eat a healthy, well-balanced diet of fruits, vegetables, whole grains, low-fat dairy products, lean meats, fish, and poultry. Each person is different. Foods that may trigger symptoms in one person may not trigger any symptoms in another person. Work with your health care provider to identify foods that are safe for you. °The items listed above may not be a complete list of recommended foods and beverages. Contact a dietitian for more information. °What foods should I avoid? °Limiting some of these foods may help manage the symptoms of GERD. Everyone is different. Consult a dietitian or your health care provider to help you identify the exact foods to avoid, if any. °Fruits °Any fruits prepared with added fat. Any fruits that cause symptoms. For some people this may include citrus fruits, such as oranges, grapefruit, pineapple, and lemons. °Vegetables °Deep-fried vegetables. French fries. Any vegetables prepared with added fat. Any vegetables that cause symptoms. For some people, this may include tomatoes and tomato products, chili peppers, onions and garlic, and horseradish. °Grains °Pastries or quick breads with added fat. °Meats and other proteins °High-fat meats, such as fatty beef or pork, hot dogs, ribs, ham, sausage, salami, and bacon. Fried meat or protein, including fried   fish and fried chicken. Nuts and nut butters, in large amounts. °Dairy °Whole milk and chocolate milk. Sour cream. Cream. Ice cream. Cream cheese. Milkshakes. °Fats and oils °Butter. Margarine. Shortening. Ghee. °Beverages °Coffee and tea, with or without caffeine. Carbonated beverages. Sodas. Energy  drinks. Fruit juice made with acidic fruits, such as orange or grapefruit. Tomato juice. Alcoholic drinks. °Sweets and desserts °Chocolate and cocoa. Donuts. °Seasonings and condiments °Pepper. Peppermint and spearmint. Added salt. Any condiments, herbs, or seasonings that cause symptoms. For some people, this may include curry, hot sauce, or vinegar-based salad dressings. °The items listed above may not be a complete list of foods and beverages to avoid. Contact a dietitian for more information. °Questions to ask your health care provider °Diet and lifestyle changes are usually the first steps that are taken to manage symptoms of GERD. If diet and lifestyle changes do not improve your symptoms, talk with your health care provider about taking medicines. °Where to find more information °International Foundation for Gastrointestinal Disorders: aboutgerd.org °Summary °When you have gastroesophageal reflux disease (GERD), food and lifestyle choices may be very helpful in easing the discomfort of GERD. °Eat frequent, small meals instead of three large meals each day. Eat your meals slowly, in a relaxed setting. Avoid bending over or lying down until 2-3 hours after eating. °Limit high-fat foods such as fatty meats or fried foods. °This information is not intended to replace advice given to you by your health care provider. Make sure you discuss any questions you have with your health care provider. °Document Revised: 07/15/2019 Document Reviewed: 07/15/2019 °Elsevier Patient Education © 2022 [employer] ° °

## 2021-02-05 ENCOUNTER — Other Ambulatory Visit: Payer: Self-pay | Admitting: Family Medicine

## 2021-02-18 DIAGNOSIS — L259 Unspecified contact dermatitis, unspecified cause: Secondary | ICD-10-CM | POA: Diagnosis not present

## 2021-02-18 DIAGNOSIS — L820 Inflamed seborrheic keratosis: Secondary | ICD-10-CM | POA: Diagnosis not present

## 2021-03-05 ENCOUNTER — Ambulatory Visit: Payer: PPO | Admitting: Hematology and Oncology

## 2021-03-08 ENCOUNTER — Other Ambulatory Visit: Payer: Self-pay | Admitting: Family Medicine

## 2021-03-18 DIAGNOSIS — Z13220 Encounter for screening for lipoid disorders: Secondary | ICD-10-CM | POA: Diagnosis not present

## 2021-03-18 DIAGNOSIS — Z79899 Other long term (current) drug therapy: Secondary | ICD-10-CM | POA: Diagnosis not present

## 2021-03-18 DIAGNOSIS — R7301 Impaired fasting glucose: Secondary | ICD-10-CM | POA: Diagnosis not present

## 2021-03-19 LAB — COMPREHENSIVE METABOLIC PANEL
ALT: 20 IU/L (ref 0–32)
AST: 19 IU/L (ref 0–40)
Albumin/Globulin Ratio: 2.2 (ref 1.2–2.2)
Albumin: 4.8 g/dL — ABNORMAL HIGH (ref 3.7–4.7)
Alkaline Phosphatase: 95 IU/L (ref 44–121)
BUN/Creatinine Ratio: 19 (ref 12–28)
BUN: 16 mg/dL (ref 8–27)
Bilirubin Total: 0.4 mg/dL (ref 0.0–1.2)
CO2: 25 mmol/L (ref 20–29)
Calcium: 10.1 mg/dL (ref 8.7–10.3)
Chloride: 103 mmol/L (ref 96–106)
Creatinine, Ser: 0.83 mg/dL (ref 0.57–1.00)
Globulin, Total: 2.2 g/dL (ref 1.5–4.5)
Glucose: 95 mg/dL (ref 70–99)
Potassium: 5.1 mmol/L (ref 3.5–5.2)
Sodium: 143 mmol/L (ref 134–144)
Total Protein: 7 g/dL (ref 6.0–8.5)
eGFR: 71 mL/min/{1.73_m2} (ref >59)

## 2021-03-19 LAB — CBC WITH DIFFERENTIAL/PLATELET
Basophils Absolute: 0.1 10*3/uL (ref 0.0–0.2)
Basos: 2 %
EOS (ABSOLUTE): 0.3 10*3/uL (ref 0.0–0.4)
Eos: 3 %
Hematocrit: 50.9 % — ABNORMAL HIGH (ref 34.0–46.6)
Hemoglobin: 16.6 g/dL — ABNORMAL HIGH (ref 11.1–15.9)
Immature Grans (Abs): 0 10*3/uL (ref 0.0–0.1)
Immature Granulocytes: 1 %
Lymphocytes Absolute: 2 10*3/uL (ref 0.7–3.1)
Lymphs: 25 %
MCH: 29.9 pg (ref 26.6–33.0)
MCHC: 32.6 g/dL (ref 31.5–35.7)
MCV: 92 fL (ref 79–97)
Monocytes Absolute: 0.4 10*3/uL (ref 0.1–0.9)
Monocytes: 5 %
Neutrophils Absolute: 5.2 10*3/uL (ref 1.4–7.0)
Neutrophils: 64 %
Platelets: 583 10*3/uL — ABNORMAL HIGH (ref 150–450)
RBC: 5.55 x10E6/uL — ABNORMAL HIGH (ref 3.77–5.28)
RDW: 13.9 % (ref 11.7–15.4)
WBC: 8.1 10*3/uL (ref 3.4–10.8)

## 2021-03-19 LAB — LIPID PANEL
Chol/HDL Ratio: 3.8 ratio (ref 0.0–4.4)
Cholesterol, Total: 193 mg/dL (ref 100–199)
HDL: 51 mg/dL (ref >39)
LDL Chol Calc (NIH): 116 mg/dL — ABNORMAL HIGH (ref 0–99)
Triglycerides: 148 mg/dL (ref 0–149)
VLDL Cholesterol Cal: 26 mg/dL (ref 5–40)

## 2021-03-19 LAB — HEMOGLOBIN A1C
Est. average glucose Bld gHb Est-mCnc: 114 mg/dL
Hgb A1c MFr Bld: 5.6 % (ref 4.8–5.6)

## 2021-04-14 ENCOUNTER — Encounter: Payer: Self-pay | Admitting: Internal Medicine

## 2021-04-16 ENCOUNTER — Ambulatory Visit (INDEPENDENT_AMBULATORY_CARE_PROVIDER_SITE_OTHER): Payer: PPO | Admitting: Nurse Practitioner

## 2021-04-16 VITALS — BP 118/70 | HR 83 | Temp 98.4°F | Ht 63.0 in | Wt 133.0 lb

## 2021-04-16 DIAGNOSIS — J019 Acute sinusitis, unspecified: Secondary | ICD-10-CM

## 2021-04-16 DIAGNOSIS — L578 Other skin changes due to chronic exposure to nonionizing radiation: Secondary | ICD-10-CM | POA: Diagnosis not present

## 2021-04-16 DIAGNOSIS — B9689 Other specified bacterial agents as the cause of diseases classified elsewhere: Secondary | ICD-10-CM | POA: Diagnosis not present

## 2021-04-16 DIAGNOSIS — Z01419 Encounter for gynecological examination (general) (routine) without abnormal findings: Secondary | ICD-10-CM

## 2021-04-16 DIAGNOSIS — J300 Vasomotor rhinitis: Secondary | ICD-10-CM | POA: Diagnosis not present

## 2021-04-16 DIAGNOSIS — R7989 Other specified abnormal findings of blood chemistry: Secondary | ICD-10-CM | POA: Diagnosis not present

## 2021-04-16 MED ORDER — AMOXICILLIN-POT CLAVULANATE 875-125 MG PO TABS: 1.0000 | ORAL_TABLET | Freq: Two times a day (BID) | ORAL | 0 refills | Status: DC

## 2021-04-16 NOTE — Patient Instructions (Signed)
Flonase  or Nasacort spray: Use daily  ?Wear Mask while you use hair spray ?

## 2021-04-16 NOTE — Progress Notes (Signed)
? ?Subjective:  ? ? Patient ID: Yvonne Maynard, female    DOB: Apr 25, 1940, 81 y.o.   MRN: [id number] ? ?HPI ?Present for well woman exam.  Daily exercise and eating a well balanced diet. Non smoker, no alcohol use, sexually active and same partner. Postmenopausal, denies vaginal itching, discharge, dysuria, hematuria. Reports occasional mild stress incontinence with cough and sneezing. Recommended vaccines and screening for age up to date. Regular vision and dental exams completed. Report year round sinus issues that have worsened over the past 2 weeks with yellowish to greenish sputum production, post nasal drainage, mild frontal sinus pressure, congestion,cough and sore throat. No wheezing or chest pain. Currently taking Singulair for allergies and reports trying several other OTC medications without relief. Report painful arthritic nodule on the right big toe. Painful mostly at night. See podiatry for foot issues. Has had an allergy workup in the past. Seen by ENT specialist in 2021 for vasomotor rhinitis. Sees dermatologist for skin cancer screening. Regular follow up with oncology for history of breast cancer.  ? ?  04/16/2021  ?  9:55 AM  ?Depression screen PHQ 2/9  ?Decreased Interest 0  ?Down, Depressed, Hopeless 0  ?PHQ - 2 Score 0  ? ? ? ?Review of Systems  ?Constitutional:  Negative for activity change, appetite change, fatigue and fever.  ?HENT:  Positive for postnasal drip, rhinorrhea and sore throat. Negative for trouble swallowing.   ?Respiratory:  Positive for cough. Negative for chest tightness, shortness of breath and wheezing.   ?Cardiovascular:  Negative for chest pain and leg swelling.  ?Gastrointestinal:  Negative for abdominal distention, abdominal pain, constipation, diarrhea, nausea and vomiting.  ?Genitourinary:  Positive for frequency. Negative for difficulty urinating, dysuria, enuresis, genital sores, pelvic pain, urgency, vaginal bleeding and vaginal discharge.  ?Neurological:  Positive  for headaches.  ?     Facial area headache.   ? ?   ?Objective:  ? Physical Exam ?Vitals and nursing note reviewed.  ?Constitutional:   ?   General: She is not in acute distress. ?   Appearance: She is well-developed and normal weight.  ?HENT:  ?   Ears:  ?   Comments: TMs mildly retracted, no erythema.  ?   Nose:  ?   Comments: Nasal mucosa clear.  ?   Mouth/Throat:  ?   Comments: Pharynx minimally injected.  ?Neck:  ?   Thyroid: No thyromegaly.  ?   Trachea: No tracheal deviation.  ?   Comments: Thyroid non tender to palpation. No mass or goiter noted.  ?Cardiovascular:  ?   Rate and Rhythm: Normal rate and regular rhythm.  ?   Heart sounds: Normal heart sounds. No murmur heard. ?Pulmonary:  ?   Effort: Pulmonary effort is normal.  ?   Breath sounds: Normal breath sounds.  ?Chest:  ?Breasts: ?   Right: No swelling, inverted nipple, mass, skin change or tenderness.  ?   Left: No swelling, inverted nipple, mass, skin change or tenderness.  ?Abdominal:  ?   General: There is no distension.  ?   Palpations: Abdomen is soft.  ?   Tenderness: There is no abdominal tenderness.  ?Genitourinary: ?   Comments: Defer pelvic exam. Denies any problems.  ?Musculoskeletal:  ?   Cervical back: Normal range of motion and neck supple.  ?Lymphadenopathy:  ?   Cervical: No cervical adenopathy.  ?   Upper Body:  ?   Right upper body: No supraclavicular, axillary or pectoral adenopathy.  ?  Left upper body: No supraclavicular, axillary or pectoral adenopathy.  ?Skin: ?   General: Skin is warm and dry.  ?   Findings: No rash.  ?   Comments: Generalized skin changes due to sun damage.   ?Neurological:  ?   Mental Status: She is alert and oriented to person, place, and time.  ?Psychiatric:     ?   Mood and Affect: Mood normal.     ?   Behavior: Behavior normal.     ?   Thought Content: Thought content normal.     ?   Judgment: Judgment normal.  ? ?Today's Vitals  ? 04/16/21 0945  ?BP: 118/70  ?Pulse: 83  ?Temp: 98.4 ?F (36.9 ?C)  ?SpO2:  98%  ?Weight: 133 lb (60.3 kg)  ?Height: _0  (1.6 m)  ? ?Body mass index is 23.56 kg/m?.  ? ?Results for orders placed or performed in visit on 01/15/21  ?CBC with Differential/Platelet  ?Result Value Ref Range  ? WBC 8.1 3.4 - 10.8 x10E3/uL  ? RBC 5.55 (H) 3.77 - 5.28 x10E6/uL  ? Hemoglobin 16.6 (H) 11.1 - 15.9 g/dL  ? Hematocrit 50.9 (H) 34.0 - 46.6 %  ? MCV 92 79 - 97 fL  ? MCH 29.9 26.6 - 33.0 pg  ? MCHC 32.6 31.5 - 35.7 g/dL  ? RDW 13.9 11.7 - 15.4 %  ? Platelets 583 (H) 150 - 450 x10E3/uL  ? Neutrophils 64 Not Estab. %  ? Lymphs 25 Not Estab. %  ? Monocytes 5 Not Estab. %  ? Eos 3 Not Estab. %  ? Basos 2 Not Estab. %  ? Neutrophils Absolute 5.2 1.4 - 7.0 x10E3/uL  ? Lymphocytes Absolute 2.0 0.7 - 3.1 x10E3/uL  ? Monocytes Absolute 0.4 0.1 - 0.9 x10E3/uL  ? EOS (ABSOLUTE) 0.3 0.0 - 0.4 x10E3/uL  ? Basophils Absolute 0.1 0.0 - 0.2 x10E3/uL  ? Immature Granulocytes 1 Not Estab. %  ? Immature Grans (Abs) 0.0 0.0 - 0.1 x10E3/uL  ?Lipid panel  ?Result Value Ref Range  ? Cholesterol, Total 193 100 - 199 mg/dL  ? Triglycerides 148 0 - 149 mg/dL  ? HDL 51 >39 mg/dL  ? VLDL Cholesterol Cal 26 5 - 40 mg/dL  ? LDL Chol Calc (NIH) 116 (H) 0 - 99 mg/dL  ? Chol/HDL Ratio 3.8 0.0 - 4.4 ratio  ?Comprehensive metabolic panel  ?Result Value Ref Range  ? Glucose 95 70 - 99 mg/dL  ? BUN 16 8 - 27 mg/dL  ? Creatinine, Ser 0.83 0.57 - 1.00 mg/dL  ? eGFR 71 >59 mL/min/1.73  ? BUN/Creatinine Ratio 19 12 - 28  ? Sodium 143 134 - 144 mmol/L  ? Potassium 5.1 3.5 - 5.2 mmol/L  ? Chloride 103 96 - 106 mmol/L  ? CO2 25 20 - 29 mmol/L  ? Calcium 10.1 8.7 - 10.3 mg/dL  ? Total Protein 7.0 6.0 - 8.5 g/dL  ? Albumin 4.8 (H) 3.7 - 4.7 g/dL  ? Globulin, Total 2.2 1.5 - 4.5 g/dL  ? Albumin/Globulin Ratio 2.2 1.2 - 2.2  ? Bilirubin Total 0.4 0.0 - 1.2 mg/dL  ? Alkaline Phosphatase 95 44 - 121 IU/L  ? AST 19 0 - 40 IU/L  ? ALT 20 0 - 32 IU/L  ?Hemoglobin A1c  ?Result Value Ref Range  ? Hgb A1c MFr Bld 5.6 4.8 - 5.6 %  ? Est. average glucose Bld  gHb Est-mCnc 114 mg/dL  ?Reviewed labs with patient.  ? ?   ?  Assessment & Plan:  ? ?Problem List Items Addressed This Visit   ? ?  ? Respiratory  ? Vasomotor rhinitis  ?  ? Musculoskeletal and Integument  ? Sun-damaged skin  ?  ? Other  ? Abnormal CBC  ? Relevant Orders  ? Ambulatory referral to Hematology / Oncology  ? Elevated platelet count  ? Relevant Orders  ? Ambulatory referral to Hematology / Oncology  ? ?Other Visit Diagnoses   ? ? Well woman exam    -  Primary  ? Acute bacterial rhinosinusitis      ? Relevant Medications  ? amoxicillin-clavulanate (AUGMENTIN) 875-125 MG tablet  ? ?  ? ? ? ? ? ? ?Meds ordered this encounter  ?Medications  ? amoxicillin-clavulanate (AUGMENTIN) 875-125 MG tablet  ?  Sig: Take 1 tablet by mouth 2 (two) times daily.  ?  Dispense:  20 tablet  ?  Refill:  0  ?  Order Specific Question:   Supervising Provider  ?  Answer:   Sallee Lange A [[id number]]  ? ? ? ?Recommend daily Flonase or Nasacort nasal spray. Continue antihistamine and Singulair.  ?Wear Mask while you use hair spray. Avoid any known environmental triggers. Call back in 7-10 days if no improvement, sooner if worse.  ?Continue healthy habits. Watch saturated fats in diet.  ?Hematology appointment due to elevated platelet level and abnormal CBC. Patient agrees with this plan.  ?Continue follow up with specialists.  ?Return in about 1 year (around 04/17/2022) for physical. ? ?  ?  ?

## 2021-04-16 NOTE — Progress Notes (Signed)
? ?  Subjective:  ? ? Patient ID: Yvonne Maynard, female    DOB: [birth year], 81 y.o.   MRN: [id number] ? ?HPI ? ?The patient comes in today for a wellness visit. ? ? ? ?A review of their health history was completed. ? A review of medications was also completed. ? ?Any needed refills;  ? ?Eating habits: good ? ?Falls/  MVA accidents in past few months: no ? ?Regular exercise: active, and stationary bikes ? ?Specialist pt sees on regular basis: oncology ? ?Preventative health issues were discussed.  ? ?Additional concerns:sinus drainage worsened x 1 week  ? ? ?Review of Systems ? ?   ?Objective:  ? Physical Exam ? ? ? ? ?   ?Assessment & Plan:  ? ? ?

## 2021-04-17 ENCOUNTER — Encounter: Payer: Self-pay | Admitting: Nurse Practitioner

## 2021-04-17 DIAGNOSIS — L578 Other skin changes due to chronic exposure to nonionizing radiation: Secondary | ICD-10-CM | POA: Insufficient documentation

## 2021-04-17 DIAGNOSIS — R7989 Other specified abnormal findings of blood chemistry: Secondary | ICD-10-CM | POA: Insufficient documentation

## 2021-05-05 ENCOUNTER — Other Ambulatory Visit: Payer: Self-pay | Admitting: Family Medicine
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
## 2021-05-07 ENCOUNTER — Other Ambulatory Visit (HOSPITAL_COMMUNITY): Payer: Self-pay

## 2021-05-07 ENCOUNTER — Encounter (HOSPITAL_COMMUNITY): Payer: Self-pay | Admitting: Hematology

## 2021-05-07 ENCOUNTER — Inpatient Hospital Stay (HOSPITAL_COMMUNITY): Payer: PPO

## 2021-05-07 ENCOUNTER — Inpatient Hospital Stay (HOSPITAL_COMMUNITY): Payer: PPO | Attending: Hematology | Admitting: Hematology

## 2021-05-07 VITALS — BP 132/69 | HR 82 | Temp 98.5°F | Resp 16 | Ht 63.0 in | Wt 132.1 lb

## 2021-05-07 DIAGNOSIS — Z7982 Long term (current) use of aspirin: Secondary | ICD-10-CM | POA: Insufficient documentation

## 2021-05-07 DIAGNOSIS — D751 Secondary polycythemia: Secondary | ICD-10-CM

## 2021-05-07 DIAGNOSIS — Z923 Personal history of irradiation: Secondary | ICD-10-CM | POA: Diagnosis not present

## 2021-05-07 DIAGNOSIS — D75839 Thrombocytosis, unspecified: Secondary | ICD-10-CM | POA: Insufficient documentation

## 2021-05-07 DIAGNOSIS — Z853 Personal history of malignant neoplasm of breast: Secondary | ICD-10-CM | POA: Insufficient documentation

## 2021-05-07 DIAGNOSIS — M8580 Other specified disorders of bone density and structure, unspecified site: Secondary | ICD-10-CM | POA: Diagnosis not present

## 2021-05-07 DIAGNOSIS — L298 Other pruritus: Secondary | ICD-10-CM | POA: Insufficient documentation

## 2021-05-07 DIAGNOSIS — J45909 Unspecified asthma, uncomplicated: Secondary | ICD-10-CM | POA: Insufficient documentation

## 2021-05-07 DIAGNOSIS — M1990 Unspecified osteoarthritis, unspecified site: Secondary | ICD-10-CM | POA: Insufficient documentation

## 2021-05-07 LAB — SEDIMENTATION RATE: Sed Rate: 5 mm/hr (ref 0–22)

## 2021-05-07 LAB — CBC WITH DIFFERENTIAL/PLATELET
Abs Immature Granulocytes: 0.03 10*3/uL (ref 0.00–0.07)
Basophils Absolute: 0.1 10*3/uL (ref 0.0–0.1)
Basophils Relative: 1 %
Eosinophils Absolute: 0.2 10*3/uL (ref 0.0–0.5)
Eosinophils Relative: 3 %
HCT: 44 % (ref 36.0–46.0)
Hemoglobin: 14.3 g/dL (ref 12.0–15.0)
Immature Granulocytes: 0 %
Lymphocytes Relative: 21 %
Lymphs Abs: 1.5 10*3/uL (ref 0.7–4.0)
MCH: 31 pg (ref 26.0–34.0)
MCHC: 32.5 g/dL (ref 30.0–36.0)
MCV: 95.2 fL (ref 80.0–100.0)
Monocytes Absolute: 0.4 10*3/uL (ref 0.1–1.0)
Monocytes Relative: 6 %
Neutro Abs: 4.9 10*3/uL (ref 1.7–7.7)
Neutrophils Relative %: 69 %
Platelets: 489 10*3/uL — ABNORMAL HIGH (ref 150–400)
RBC: 4.62 MIL/uL (ref 3.87–5.11)
RDW: 14.6 % (ref 11.5–15.5)
WBC: 7.2 10*3/uL (ref 4.0–10.5)
nRBC: 0 % (ref 0.0–0.2)

## 2021-05-07 LAB — IRON AND TIBC
Iron: 91 ug/dL (ref 28–170)
Saturation Ratios: 28 % (ref 10.4–31.8)
TIBC: 323 ug/dL (ref 250–450)
UIBC: 232 ug/dL

## 2021-05-07 LAB — C-REACTIVE PROTEIN: CRP: 0.5 mg/dL (ref <1.0)

## 2021-05-07 LAB — LACTATE DEHYDROGENASE: LDH: 151 U/L (ref 98–192)

## 2021-05-07 LAB — FERRITIN: Ferritin: 33 ng/mL (ref 11–307)

## 2021-05-07 NOTE — Patient Instructions (Signed)
[place] at [facility] ?Discharge Instructions ? ?You were seen today by Dr. Delton Coombes & Tarri Abernethy PA-C for your elevated platelets and elevated red blood cells.  We do not yet know the cause of your elevated platelets or red blood cells, so we will check labs today to help [place] understand what is going on in your body.  We will see you for follow-up visit in 2 to 3 weeks to discuss these results. ? ?MEDICATIONS: Continue taking aspirin 81 mg daily. ? ?FOLLOW-UP APPOINTMENT: Office visit in 2 to 3 weeks to discuss results ? ? ?Thank you for choosing [place] at [facility] to provide your oncology and hematology care.  To afford each patient quality time with our provider, please arrive at least 15 minutes before your scheduled appointment time.  ? ?If you have a lab appointment with the [facility] please come in thru the Main Entrance and check in at the main information desk. ? ?You need to re-schedule your appointment should you arrive 10 or more minutes late.  We strive to give you quality time with our providers, and arriving late affects you and other patients whose appointments are after yours.  Also, if you no show three or more times for appointments you may be dismissed from the clinic at the providers discretion.     ?Again, thank you for choosing [facility].  Our hope is that these requests will decrease the amount of time that you wait before being seen by our physicians.       ?_____________________________________________________________ ? ?Should you have questions after your visit to [facility], please contact our office at [phone] and follow the prompts.  Our office hours are 8:00 a.m. and 4:30 p.m. Monday - Friday.  Please note that voicemails left after 4:00 p.m. may not be returned until the following business day.  We are closed weekends and major holidays.  You do have access to a nurse 24-7, just  call the main number to the clinic [phone] and do not press any options, hold on the line and a nurse will answer the phone.   ? ?For prescription refill requests, have your pharmacy contact our office and allow 72 hours.   ? ?Due to Covid, you will need to wear a mask upon entering the hospital. If you do not have a mask, a mask will be given to you at the Main Entrance upon arrival. For doctor visits, patients may have 1 support person age 93 or older with them. For treatment visits, patients can not have anyone with them due to social distancing guidelines and our immunocompromised population.  ? ? ? ?

## 2021-05-08 LAB — RHEUMATOID FACTOR: Rheumatoid fact SerPl-aCnc: <10 IU/mL (ref <14.0)

## 2021-05-08 LAB — ERYTHROPOIETIN: Erythropoietin: 3 m[IU]/mL (ref 2.6–18.5)

## 2021-05-10 LAB — ANTINUCLEAR ANTIBODIES, IFA: ANA Ab, IFA: NEGATIVE

## 2021-05-10 LAB — PATHOLOGIST SMEAR REVIEW

## 2021-05-14 LAB — JAK2 V617F RFX CALR/MPL/E12-15: JAK2 V617F %: 52.7 %

## 2021-05-21 DIAGNOSIS — Z171 Estrogen receptor negative status [ER-]: Secondary | ICD-10-CM | POA: Diagnosis not present

## 2021-05-21 DIAGNOSIS — C50411 Malignant neoplasm of upper-outer quadrant of right female breast: Secondary | ICD-10-CM | POA: Diagnosis not present

## 2021-05-26 NOTE — Progress Notes (Signed)
? ?[facility] ?[address] ?[place], [place] [address] ? ? ?CLINIC:  ?Medical Oncology/Hematology ? ?PCP:  ?Kathyrn Drown, MD ?[place] [address] ?[phone] ? ? ?REASON FOR VISIT:  ?Follow-up for erythrocytosis and thrombocytosis ? ?PRIOR THERAPY: None ? ?CURRENT THERAPY: Under work-up ? ?INTERVAL HISTORY:  ?Ms. Yvonne Maynard 81 y.o. female returns for routine follow-up of her erythrocytosis and thrombocytosis.  She was seen for initial consultation by Dr. Delton Coombes and Tarri Abernethy PA-C on 05/07/2021. ? ?At today's visit, she reports feeling the same.  She has not had any changes in her symptoms or health status since her initial visit 2 weeks ago.  She continues to endorse aquagenic pruritus and erythromelalgia as as described in initial consultation note.  She denies any fevers, chills, night sweats, or unintentional weight loss. ? ?She has 50% energy and 80% appetite. She endorses that she is maintaining a stable weight. ? ? ?REVIEW OF SYSTEMS:  ?Review of Systems  ?Constitutional:  Positive for fatigue. Negative for appetite change, chills, diaphoresis, fever and unexpected weight change.  ?HENT:   Negative for lump/mass and nosebleeds.   ?Eyes:  Negative for eye problems.  ?Respiratory:  Negative for cough, hemoptysis and shortness of breath.   ?Cardiovascular:  Negative for chest pain, leg swelling and palpitations.  ?Gastrointestinal:  Negative for abdominal pain, blood in stool, constipation, diarrhea, nausea and vomiting.  ?Genitourinary:  Negative for hematuria.   ?Skin: Negative.   ?Neurological:  Positive for numbness (burning pain right foot). Negative for dizziness, headaches and light-headedness.  ?Hematological:  Does not bruise/bleed easily.   ? ? ?PAST MEDICAL/SURGICAL HISTORY:  ?Past Medical History:  ?Diagnosis Date  ? Aortic atherosclerosis ([place]) 06/07/2020  ? Seen on cervical xray 06/07/20  ? Asthma   ? None in years  ? Cancer [facility])   ? skin cancer nose    ? Cerebrovascular disease   ? Diverticulosis of colon 08/19/2010  ? Per CT scan  ? DJD (degenerative joint disease) of cervical spine   ? Generalized convulsive seizures ([place]) 30 YEARS AGO  ? Remotely, following head injury from [place]  ? Macular degeneration   ? Osteopenia   ? Seizure ([place])   ? none since before 2000  ? Vertigo 08/2010  ? ?Past Surgical History:  ?Procedure Laterality Date  ? AXILLARY SENTINEL NODE BIOPSY Right 10/05/2018  ? Procedure: RIGHT AXILLARY SENTINEL LYMPH NODE BIOPSY;  Surgeon: Alphonsa Overall, MD;  Location: [place];  Service: General;  Laterality: Right;  ? BREAST LUMPECTOMY WITH RADIOACTIVE SEED LOCALIZATION Right 09/21/2018  ? Procedure: RIGHT BREAST LUMPECTOMY WITH RADIOACTIVE SEED LOCALIZATION;  Surgeon: Alphonsa Overall, MD;  Location: [place];  Service: General;  Laterality: Right;  ? COLONOSCOPY    ? TONSILLECTOMY    ? TUBAL LIGATION    ? TUBAL LIGATION    ? ? ? ?SOCIAL HISTORY:  ?Social History  ? ?Socioeconomic History  ? Marital status: Married  ?  Spouse name: Jeneen Rinks  ? Number of children: 3  ? Years of education: Not on file  ? Highest education level: Not on file  ?Occupational History  ? Not on file  ?Tobacco Use  ? Smoking status: Never  ? Smokeless tobacco: Never  ?Vaping Use  ? Vaping Use: Never used  ?Substance and Sexual Activity  ? Alcohol use: No  ? Drug use: No  ? Sexual activity: Yes  ?Other Topics Concern  ? Not on file  ?Social  History Narrative  ? 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  ? ?Social Determinants of Health  ? ?Financial Resource Strain: Low Risk   ? Difficulty of Paying Living Expenses: Not hard at all  ?Food Insecurity: No Food Insecurity  ? Worried About Charity fundraiser in the Last Year: Never true  ? Ran Out of Food in the Last Year: Never true  ?Transportation Needs: No Transportation Needs  ? Lack of Transportation (Medical): No  ? Lack of Transportation (Non-Medical): No  ?Physical Activity:  Sufficiently Active  ? Days of Exercise per Week: 5 days  ? Minutes of Exercise per Session: 30 min  ?Stress: No Stress Concern Present  ? Feeling of Stress : Not at all  ?Social Connections: Socially Integrated  ? Frequency of Communication with Friends and Family: More than three times a week  ? Frequency of Social Gatherings with Friends and Family: More than three times a week  ? Attends Religious Services: More than 4 times per year  ? Active Member of Clubs or Organizations: Yes  ? Attends Archivist Meetings: More than 4 times per year  ? Marital Status: Married  ?Intimate Partner Violence: Not At Risk  ? Fear of Current or Ex-Partner: No  ? Emotionally Abused: No  ? Physically Abused: No  ? Sexually Abused: No  ? ? ?FAMILY HISTORY:  ?Family History  ?Problem Relation Age of Onset  ? Crohn's disease Mother   ? Colon cancer Maternal Uncle 21  ? ? ?CURRENT MEDICATIONS:  ?Outpatient Encounter Medications as of 05/27/2021  ?Medication Sig  ? albuterol (VENTOLIN HFA) 108 (90 Base) MCG/ACT inhaler Inhale 2 puffs into the lungs 2 (two) times daily as needed for wheezing or shortness of breath. (Patient not taking: Reported on 04/16/2021)  ? aspirin EC 81 MG tablet Take 81 mg by mouth at bedtime.  ? Calcium Carbonate-Vit D-Min (CALTRATE 600+D PLUS MINERALS) 600-800 MG-UNIT CHEW   ? cycloSPORINE (RESTASIS) 0.05 % ophthalmic emulsion Place 1 drop into both eyes 2 (two) times daily.  ? montelukast (SINGULAIR) 10 MG tablet TAKE ONE TABLET BY MOUTH ONCE DAILY.  ? Multiple Vitamins-Minerals (CENTRUM SILVER 50+WOMEN PO)   ? Multiple Vitamins-Minerals (PRESERVISION AREDS 2 PO) Take by mouth 2 (two) times daily.  ? Omega-3 Fatty Acids (FISH OIL) 1000 MG CAPS Take 1,000 mg by mouth daily.  ? OVER THE COUNTER MEDICATION chlorimaletae '4mg'$  one every 4 hours prn allergies  ? ?No facility-administered encounter medications on file as of 05/27/2021.  ? ? ?ALLERGIES:  ?No Known Allergies ? ? ?PHYSICAL EXAM:  ?ECOG PERFORMANCE  STATUS: 1 - Symptomatic but completely ambulatory ? ?There were no vitals filed for this visit. ?There were no vitals filed for this visit. ?Physical Exam ?Constitutional:   ?   Appearance: Normal appearance.  ?HENT:  ?   Head: Normocephalic and atraumatic.  ?   Mouth/Throat:  ?   Mouth: Mucous membranes are moist.  ?Eyes:  ?   Extraocular Movements: Extraocular movements intact.  ?   Pupils: Pupils are equal, round, and reactive to light.  ?Cardiovascular:  ?   Rate and Rhythm: Normal rate and regular rhythm.  ?   Pulses: Normal pulses.  ?   Heart sounds: Normal heart sounds.  ?Pulmonary:  ?   Effort: Pulmonary effort is normal.  ?   Breath sounds: Normal breath sounds.  ?Abdominal:  ?   General: Bowel sounds are normal.  ?   Palpations: Abdomen  is soft. There is no hepatomegaly or splenomegaly.  ?   Tenderness: There is no abdominal tenderness.  ?Musculoskeletal:     ?   General: No swelling.  ?   Right lower leg: No edema.  ?   Left lower leg: No edema.  ?Lymphadenopathy:  ?   Cervical: No cervical adenopathy.  ?Skin: ?   General: Skin is warm and dry.  ?   Comments: Faint dusky red erythema of fingers of bilateral hands.  ?Neurological:  ?   General: No focal deficit present.  ?   Mental Status: She is alert and oriented to person, place, and time.  ?Psychiatric:     ?   Mood and Affect: Mood normal.     ?   Behavior: Behavior normal.  ? ? ? ?LABORATORY DATA:  ?I have reviewed the labs as listed.  ?CBC ?   ?Component Value Date/Time  ? WBC 7.2 05/07/2021 1053  ? RBC 4.62 05/07/2021 1053  ? HGB 14.3 05/07/2021 1053  ? HGB 16.6 (H) 03/18/2021 0803  ? HCT 44.0 05/07/2021 1053  ? HCT 50.9 (H) 03/18/2021 0803  ? PLT 489 (H) 05/07/2021 1053  ? PLT 583 (H) 03/18/2021 0803  ? MCV 95.2 05/07/2021 1053  ? MCV 92 03/18/2021 0803  ? MCH 31.0 05/07/2021 1053  ? MCHC 32.5 05/07/2021 1053  ? RDW 14.6 05/07/2021 1053  ? RDW 13.9 03/18/2021 0803  ? LYMPHSABS 1.5 05/07/2021 1053  ? LYMPHSABS 2.0 03/18/2021 0803  ? MONOABS 0.4  05/07/2021 1053  ? EOSABS 0.2 05/07/2021 1053  ? EOSABS 0.3 03/18/2021 0803  ? BASOSABS 0.1 05/07/2021 1053  ? BASOSABS 0.1 03/18/2021 0803  ? ? ?  Latest Ref Rng & Units 03/18/2021  ?  8:03 AM 03/23/2020  ?  8:

## 2021-05-27 ENCOUNTER — Inpatient Hospital Stay (HOSPITAL_COMMUNITY): Payer: PPO | Attending: Hematology | Admitting: Physician Assistant

## 2021-05-27 VITALS — BP 138/73 | HR 80 | Temp 98.4°F | Resp 16 | Ht 63.0 in | Wt 131.0 lb

## 2021-05-27 DIAGNOSIS — D751 Secondary polycythemia: Secondary | ICD-10-CM | POA: Diagnosis not present

## 2021-05-27 DIAGNOSIS — Z1589 Genetic susceptibility to other disease: Secondary | ICD-10-CM

## 2021-05-27 DIAGNOSIS — D473 Essential (hemorrhagic) thrombocythemia: Secondary | ICD-10-CM

## 2021-05-27 DIAGNOSIS — D75839 Thrombocytosis, unspecified: Secondary | ICD-10-CM | POA: Insufficient documentation

## 2021-05-27 MED ORDER — HYDROXYUREA 500 MG PO CAPS: 500.0000 mg | ORAL_CAPSULE | ORAL | 3 refills | Status: DC

## 2021-05-27 NOTE — Patient Instructions (Signed)
[place] at [facility] ?Discharge Instructions ? ?You were seen today by Tarri Abernethy PA-C for your elevated platelets.  Lab tests showed that your elevated platelets are due to a genetic mutation called "JAK2 essential thrombocytosis."  This causes your body to make too many platelets. ?- The main risk of your elevated platelets is that it could cause heart attack, blood clots, and stroke. ?- Please review the attached handouts for important information about the symptoms of potential blood clot, heart attack, and stroke. ?- In some patients, essential thrombocytosis can progress to leukemia or another blood disorder called myelofibrosis.  We do not see any signs of those conditions in you at this time. ?- You should take aspirin 81 mg daily to decrease your risk of blood clots, heart attack, and stroke. ?- We will start you on a medication called hydroxyurea (Hydrea).  You will take 1 tablet (500 mg) on Monday, Wednesday, and Friday.  Please review the attached handout for further information about this medication. ?- We will check abdominal ultrasound to make sure you do not have any enlargement in your spleen. ?- We will repeat labs and see you for follow-up visit in 4 weeks. ? ? ?Thank you for choosing [place] at [facility] to provide your oncology and hematology care.  To afford each patient quality time with our provider, please arrive at least 15 minutes before your scheduled appointment time.  ? ?If you have a lab appointment with the [place] please come in thru the Main Entrance and check in at the main information desk. ? ?You need to re-schedule your appointment should you arrive 10 or more minutes late.  We strive to give you quality time with our providers, and arriving late affects you and other patients whose appointments are after yours.  Also, if you no show three or more times for appointments you may be dismissed from the clinic at  the providers discretion.     ?Again, thank you for choosing [facility].  Our hope is that these requests will decrease the amount of time that you wait before being seen by our physicians.       ?_____________________________________________________________ ? ?Should you have questions after your visit to [facility], please contact our office at [phone] and follow the prompts.  Our office hours are 8:00 a.m. and 4:30 p.m. Monday - Friday.  Please note that voicemails left after 4:00 p.m. may not be returned until the following business day.  We are closed weekends and major holidays.  You do have access to a nurse 24-7, just call the main number to the clinic [phone] and do not press any options, hold on the line and a nurse will answer the phone.   ? ?For prescription refill requests, have your pharmacy contact our office and allow 72 hours.   ? ?Due to Covid, you will need to wear a mask upon entering the hospital. If you do not have a mask, a mask will be given to you at the Main Entrance upon arrival. For doctor visits, patients may have 1 support person age 1 or older with them. For treatment visits, patients can not have anyone with them due to social distancing guidelines and our immunocompromised population.  ? ? ? ?

## 2021-06-08 ENCOUNTER — Telehealth: Payer: Self-pay | Admitting: *Deleted

## 2021-06-08 NOTE — Telephone Encounter (Signed)
Received call from established breast pt with MD regarding recent diagnosis showing JAK2 gene.  Pt requesting second opinion evaluation by MD.  Appt scheduled, pt verbalized understanding of appt date and time.

## 2021-06-09 ENCOUNTER — Ambulatory Visit (HOSPITAL_COMMUNITY)
Admission: RE | Admit: 2021-06-09 | Discharge: 2021-06-09 | Disposition: A | Discharge status: Home / Self Care | Payer: PPO | Source: Ambulatory Visit | Attending: Physician Assistant | Admitting: Physician Assistant

## 2021-06-09 DIAGNOSIS — Z1589 Genetic susceptibility to other disease: Secondary | ICD-10-CM | POA: Insufficient documentation

## 2021-06-09 DIAGNOSIS — D473 Essential (hemorrhagic) thrombocythemia: Secondary | ICD-10-CM | POA: Diagnosis not present

## 2021-06-09 DIAGNOSIS — R161 Splenomegaly, not elsewhere classified: Secondary | ICD-10-CM | POA: Diagnosis not present

## 2021-06-10 ENCOUNTER — Inpatient Hospital Stay: Payer: PPO | Attending: Hematology and Oncology | Admitting: Hematology and Oncology

## 2021-06-10 ENCOUNTER — Other Ambulatory Visit: Payer: Self-pay

## 2021-06-10 DIAGNOSIS — M8580 Other specified disorders of bone density and structure, unspecified site: Secondary | ICD-10-CM | POA: Insufficient documentation

## 2021-06-10 DIAGNOSIS — C50411 Malignant neoplasm of upper-outer quadrant of right female breast: Secondary | ICD-10-CM

## 2021-06-10 DIAGNOSIS — Z171 Estrogen receptor negative status [ER-]: Secondary | ICD-10-CM

## 2021-06-10 DIAGNOSIS — Z923 Personal history of irradiation: Secondary | ICD-10-CM | POA: Insufficient documentation

## 2021-06-10 DIAGNOSIS — D471 Chronic myeloproliferative disease: Secondary | ICD-10-CM

## 2021-06-10 NOTE — Progress Notes (Signed)
Patient Care Team: Kathyrn Drown, MD as PCP - General (Family Medicine) Nicholas Lose, MD as Consulting Physician (Hematology and Oncology) Eppie Gibson, MD as Attending Physician (Radiation Oncology) Coralie Keens, MD as Consulting Physician (General Surgery)  DIAGNOSIS:  Encounter Diagnoses  Name Primary?   Malignant neoplasm of upper-outer quadrant of right breast in female, estrogen receptor negative ([place])    Myeloproliferative disease ([place])     SUMMARY OF ONCOLOGIC HISTORY: Oncology History  Malignant neoplasm of upper-outer quadrant of right female breast ([place])  08/08/2018 Cancer Staging   Staging form: Breast, AJCC 8th Edition - Clinical stage from 08/08/2018: Stage 0 (cTis (DCIS), cN0, cM0, ER: Unknown, PR: Unknown, HER2: Not Assessed) - Signed by Nicholas Lose, MD on 09/04/2018    08/15/2018 Initial Diagnosis   Routine screening mammogram detected a 0.6cm mass in the right breast, 5cm from the nipple with no axillary adenopathy. Biopsy showed high grade DCIS, with insufficient tumor for receptor studies.   09/21/2018 Surgery   Right lumpectomy on 09/20/18 Lucia Gaskins) [phone]): intermediate to high grade DCIS with a 0.4cm focus of invasive carcinoma, lymphovascular invasion present, clear margins. ER negative, PR negative, HER-2 positive, Ki67 40%  10/05/2018 lymph node biopsy: 2 lymph nodes negative for carcinoma   09/21/2018 Cancer Staging   Staging form: Breast, AJCC 8th Edition - Pathologic stage from 09/21/2018: Stage IA (pT1a, pN0, cM0, G1, ER-, PR-, HER2+) - Signed by Gardenia Phlegm, NP on 10/10/2018    11/05/2018 - 12/03/2018 Radiation Therapy   The patient initially received a dose of 40.05 Gy in 15 fractions to the breast using whole-breast tangent fields. This was delivered using a 3-D conformal technique. The pt received a boost delivering an additional 10 Gy in 5 fractions using a electron boost with 28mV electrons. The total dose was 50.05 Gy.      CHIEF COMPLIANT: Follow-up to discuss recent diagnosis of myeloproliferative disease and for breast cancer history  INTERVAL HISTORY: Yvonne FENSTERMAKERis a 81year old with above-mentioned history of breast cancer who is currently on surveillance and comes in annually for follow-ups in August.  She is ER negative HER2 positive breast cancer.  After undergoing radiation she is currently on surveillance.  She was noted to have elevated platelet count and [place].  JAK2 mutation was positive and she is currently on hydroxyurea 500 mg 3 times a week.  She has had no problems tolerating Hydrea.  She wanted to come see [place] discuss this new diagnosis and make sure there are no other treatment options.   ALLERGIES:  has No Known Allergies.  MEDICATIONS:  Current Outpatient Medications  Medication Sig Dispense Refill   albuterol (VENTOLIN HFA) 108 (90 Base) MCG/ACT inhaler Inhale 2 puffs into the lungs 2 (two) times daily as needed for wheezing or shortness of breath. 18 g 0   aspirin EC 81 MG tablet Take 81 mg by mouth at bedtime.     Calcium Carbonate-Vit D-Min (CALTRATE 600+D PLUS MINERALS) 600-800 MG-UNIT CHEW      cycloSPORINE (RESTASIS) 0.05 % ophthalmic emulsion Place 1 drop into both eyes 2 (two) times daily. 0.4 mL    hydroxyurea (HYDREA) 500 MG capsule Take 1 capsule (500 mg total) by mouth every Monday, Wednesday, and Friday. May take with food to minimize GI side effects. 15 capsule 3   montelukast (SINGULAIR) 10 MG tablet TAKE ONE TABLET BY MOUTH ONCE DAILY. 30 tablet 0  Multiple Vitamins-Minerals (CENTRUM SILVER 50+WOMEN PO)      Multiple Vitamins-Minerals (PRESERVISION AREDS 2 PO) Take by mouth 2 (two) times daily.     Omega-3 Fatty Acids (FISH OIL) 1000 MG CAPS Take 1,000 mg by mouth daily.     OVER THE COUNTER MEDICATION chlorimaletae 56m one every 4 hours prn allergies     triamcinolone (NASACORT)  55 MCG/ACT AERO nasal inhaler Place 2 sprays into the nose daily. (Patient not taking: Reported on 05/27/2021)     No current facility-administered medications for this visit.    PHYSICAL EXAMINATION: ECOG PERFORMANCE STATUS: 1 - Symptomatic but completely ambulatory  Vitals:   06/10/21 1204  BP: 127/65  Pulse: 69  Resp: 18  Temp: 97.7 F (36.5 C)  SpO2: 100%   Filed Weights   06/10/21 1204  Weight: 130 lb 11.2 oz (59.3 kg)    BREAST: No palpable masses or nodules in either right or left breasts. No palpable axillary supraclavicular or infraclavicular adenopathy no breast tenderness or nipple discharge. (exam performed in the presence of a chaperone)  LABORATORY DATA:  I have reviewed the data as listed    Latest Ref Rng & Units 03/18/2021    8:03 AM 03/23/2020    8:08 AM 03/11/2019    8:37 AM  CMP  Glucose 70 - 99 mg/dL 95   89   92    BUN 8 - 27 mg/dL _0 Creatinine 0.57 - 1.00 mg/dL 0.83   0.80   0.88    Sodium 134 - 144 mmol/L 143   142   141    Potassium 3.5 - 5.2 mmol/L 5.1   4.7   4.5    Chloride 96 - 106 mmol/L 103   105   101    CO2 20 - 29 mmol/L _1 Calcium 8.7 - 10.3 mg/dL 10.1   9.3   9.7    Total Protein 6.0 - 8.5 g/dL 7.0   6.5   6.9    Total Bilirubin 0.0 - 1.2 mg/dL 0.4   0.3   0.3    Alkaline Phos 44 - 121 IU/L 95   89   90    AST 0 - 40 IU/L _2 ALT 0 - 32 IU/L _3 Lab Results  Component Value Date   WBC 7.2 05/07/2021   HGB 14.3 05/07/2021   HCT 44.0 05/07/2021   MCV 95.2 05/07/2021   PLT 489 (H) 05/07/2021   NEUTROABS 4.9 05/07/2021    ASSESSMENT & PLAN:  Malignant neoplasm of upper-outer quadrant of right female breast ([place] 09/20/2018:Right lumpectomy on 09/20/18 (Lucia Gaskins: intermediate to high grade DCIS with a 0.4cm focus of invasive carcinoma, lymphovascular invasion present, clear margins. Stage Ia: ER/PR negative HER-2 positive 10/05/2018: Sentinel lymph node biopsy: 0/2 lymph nodes  negative 11/06/2018- 12/03/2018: Adjuvant radiation   Treatment plan: No role of adjuvant systemic chemotherapy or antiestrogen therapy because of the size of the tumor and the fact that it is estrogen receptor negative.   Breast cancer surveillance: 1. Breast exam   06/10/2021 benign 2. [facility]   [facility] T score -2.1: Osteopenia: Recommended calcium and vitamin D      Return to clinic in 1 year for follow-up  Myeloproliferative disease ([place])    Latest Ref Rng & Units 05/07/2021   10:53 AM 03/18/2021    8:03 AM 03/23/2020    8:08 AM  CBC  WBC 4.0 - 10.5 K/uL 7.2   8.1   6.6    Hemoglobin 12.0 - 15.0 g/dL 14.3   16.6   14.9    Hematocrit 36.0 - 46.0 % 44.0   50.9   45.6    Platelets 150 - 400 K/uL 489   583   566      Most likely essential thrombocytosis.  However polycythemia cannot be completely ruled out. JAK2 mutation positivity can be present in 50% of cases with essential thrombocytosis and over 90% of cases with polycythemia vera. Agree with the current treatment plan of hydroxyurea titrated to keep platelets below 500 No evidence of splenomegaly. No additional recommendations at this time.   No orders of the defined types were placed in this encounter.  The patient has a good understanding of the overall plan. she agrees with it. she will call with any problems that may develop before the next visit here. Total time spent: 30 mins including face to face time and time spent for planning, charting and co-ordination of care   Harriette Ohara, MD 06/10/21

## 2021-06-10 NOTE — Assessment & Plan Note (Signed)
    Latest Ref Rng & Units 05/07/2021   10:53 AM 03/18/2021    8:03 AM 03/23/2020    8:08 AM  CBC  WBC 4.0 - 10.5 K/uL 7.2   8.1   6.6    Hemoglobin 12.0 - 15.0 g/dL 14.3   16.6   14.9    Hematocrit 36.0 - 46.0 % 44.0   50.9   45.6    Platelets 150 - 400 K/uL 489   583   566      Most likely essential thrombocytosis.  However polycythemia cannot be completely ruled out. JAK2 mutation positivity can be present in 50% of cases with essential thrombocytosis and over 90% of cases with polycythemia vera. Agree with the current treatment plan of hydroxyurea titrated to keep platelets below 500 No evidence of splenomegaly. No additional recommendations at this time.

## 2021-06-10 NOTE — Assessment & Plan Note (Signed)
09/20/2018:Right lumpectomy on 09/20/18 Lucia Gaskins): intermediate to high grade DCIS with a 0.4cm focus of invasive carcinoma, lymphovascular invasion present, clear margins. Stage Ia:ER/PR negative HER-2 positive 10/05/2018: Sentinel lymph node biopsy: 0/2 lymph nodes negative 11/06/2018-12/03/2018: Adjuvant radiation  Treatment plan: No role of adjuvant systemic chemotherapy or antiestrogen therapy because of the size of the tumor and the fact that it is estrogen receptor negative.  Breast cancer surveillance: 1. Breast exam  06/10/2021 benign 2. Mammogram 08/25/2020: Benign at [facility], density category B  Bone density at [facility] 08/03/2020: T score -2.1: Osteopenia: Recommended calcium and vitamin D     Return to clinic in 1 year for follow-up

## 2021-06-23 NOTE — Progress Notes (Signed)
Yvonne Maynard,  [id number]   CLINIC:  Medical Oncology/Hematology  PCP:  Kathyrn Drown, MD [address] [place] [address] [phone]   REASON FOR VISIT:  Follow-up for JAK2 essential thrombocytosis  PRIOR THERAPY: None  CURRENT THERAPY: Hydrea 500 mg Monday/Wednesday/Friday  INTERVAL HISTORY:  Yvonne Maynard 81 y.o. female returns for routine follow-up of her recently diagnosed JAK2 essential thrombocytosis.  She was last seen by Tarri Abernethy PA-C on 05/27/2021.  At today's visit, she reports feeling well .  She has not had any changes in her symptoms or health status since her visit last month.  Since her last visit, she was started on Hydrea 500 mg Monday/Wednesday/Friday.  She is tolerating this well.  She denies any mouth ulcers, skin sores, or GI side effects.  Her energy levels have been good.  She reports that she has not noticed her aqua genic pruritus is much since starting Hydrea.  She does continue to have occasional reddish discoloration in her feet and fingers.  She continues to complain of right foot and right lower leg pain as well as bilateral leg cramps at night.  No Raynaud's phenomenon or other vasomotor symptoms such as dizziness, tinnitus, blurry vision, strokelike symptoms, or neuropathy.  She has occasional night sweats.  She denies any unexplained fever, chills, or weight loss.  No abdominal pain nausea, or early satiety.  She has 75% energy and 100% appetite. She endorses that she is maintaining a stable weight.   REVIEW OF SYSTEMS:    Review of Systems  Constitutional:  Positive for fatigue (mild, baseline). Negative for appetite change, chills, diaphoresis, fever and unexpected weight change.  HENT:   Negative for lump/mass and nosebleeds.   Eyes:  Negative for eye problems.  Respiratory:  Positive for cough. Negative for hemoptysis and shortness of breath.   Cardiovascular:  Negative for chest  pain, leg swelling and palpitations.  Gastrointestinal:  Negative for abdominal pain, blood in stool, constipation, diarrhea, nausea and vomiting.  Genitourinary:  Positive for frequency. Negative for hematuria.   Musculoskeletal:  Positive for arthralgias (aching right foot and right leg "bone pain").  Skin: Negative.   Neurological:  Positive for numbness (burning pain right foot). Negative for dizziness, headaches and light-headedness.  Hematological:  Does not bruise/bleed easily.      PAST MEDICAL/SURGICAL HISTORY:  Past Medical History:  Diagnosis Date   Aortic atherosclerosis ([place]) 06/07/2020   Seen on cervical xray 06/07/20   Asthma    None in years   Cancer [facility])    skin cancer nose    Cerebrovascular disease    Diverticulosis of colon 08/19/2010   Per CT scan   DJD (degenerative joint disease) of cervical spine    Generalized convulsive seizures ([place]) 30 YEARS AGO   Remotely, following head injury from [place] degeneration    Osteopenia    Seizure ([place])    none since before 2000   Vertigo 08/2010   Past Surgical History:  Procedure Laterality Date   AXILLARY SENTINEL NODE BIOPSY Right 10/05/2018   Procedure: RIGHT AXILLARY SENTINEL LYMPH NODE BIOPSY;  Surgeon: Alphonsa Overall, MD;  Location: [facility];  Service: General;  Laterality: Right;   BREAST LUMPECTOMY WITH RADIOACTIVE SEED LOCALIZATION Right 09/21/2018   Procedure: RIGHT BREAST LUMPECTOMY WITH RADIOACTIVE SEED LOCALIZATION;  Surgeon: Alphonsa Overall, MD;  Location: [facility];  Service: General;  Laterality: Right;   COLONOSCOPY  TONSILLECTOMY     TUBAL LIGATION     TUBAL LIGATION       SOCIAL HISTORY:  Social History   Socioeconomic History   Marital status: Married    Spouse name: Jeneen Rinks   Number of children: 3   Years of education: Not on file   Highest education level: Not on file  Occupational History   Not on file  Tobacco Use   Smoking status: Never   Smokeless tobacco:  Never  Vaping Use   Vaping Use: Never used  Substance and Sexual Activity   Alcohol use: No   Drug use: No   Sexual activity: Yes  Other Topics Concern   Not on file  Social History Narrative   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   Social Determinants of Health   Financial Resource Strain: Low Risk    Difficulty of Paying Living Expenses: Not hard at all  Food Insecurity: No Food Insecurity   Worried About Charity fundraiser in the Last Year: Never true   [place] in the Last Year: Never true  Transportation Needs: No Transportation Needs   Lack of Transportation (Medical): No   Lack of Transportation (Non-Medical): No  Physical Activity: Sufficiently Active   Days of Exercise per Week: 5 days   Minutes of Exercise per Session: 30 min  Stress: No Stress Concern Present   Feeling of Stress : Not at all  Social Connections: Socially Integrated   Frequency of Communication with Friends and Family: More than three times a week   Frequency of Social Gatherings with Friends and Family: More than three times a week   Attends Religious Services: More than 4 times per year   Active Member of [employer] or Organizations: Yes   Attends Music therapist: More than 4 times per year   Marital Status: Married  Human resources officer Violence: Not At Risk   Fear of Current or Ex-Partner: No   Emotionally Abused: No   Physically Abused: No   Sexually Abused: No    FAMILY HISTORY:  Family History  Problem Relation Age of Onset   Crohn's disease Mother    Colon cancer Maternal Uncle 53    CURRENT MEDICATIONS:  Outpatient Encounter Medications as of 06/24/2021  Medication Sig   albuterol (VENTOLIN HFA) 108 (90 Base) MCG/ACT inhaler Inhale 2 puffs into the lungs 2 (two) times daily as needed for wheezing or shortness of breath.   aspirin EC 81 MG tablet Take 81 mg by mouth at bedtime.   Calcium Carbonate-Vit D-Min (CALTRATE 600+D PLUS  MINERALS) 600-800 MG-UNIT CHEW    cycloSPORINE (RESTASIS) 0.05 % ophthalmic emulsion Place 1 drop into both eyes 2 (two) times daily.   hydroxyurea (HYDREA) 500 MG capsule Take 1 capsule (500 mg total) by mouth every Monday, Wednesday, and Friday. May take with food to minimize GI side effects.   montelukast (SINGULAIR) 10 MG tablet TAKE ONE TABLET BY MOUTH ONCE DAILY.   Multiple Vitamins-Minerals (CENTRUM SILVER 50+WOMEN PO)    Multiple Vitamins-Minerals (PRESERVISION AREDS 2 PO) Take by mouth 2 (two) times daily.   Omega-3 Fatty Acids (FISH OIL) 1000 MG CAPS Take 1,000 mg by mouth daily.   OVER THE COUNTER MEDICATION chlorimaletae 86m one every 4 hours prn allergies   triamcinolone (NASACORT) 55 MCG/ACT AERO nasal inhaler Place 2 sprays into the nose daily. (Patient not taking: Reported on 05/27/2021)   No facility-administered encounter medications on file as  of 06/24/2021.    ALLERGIES:  No Known Allergies   PHYSICAL EXAM:  ECOG PERFORMANCE STATUS: 1 - Symptomatic but completely ambulatory    There were no vitals filed for this visit. There were no vitals filed for this visit. Physical Exam Constitutional:      Appearance: Normal appearance.  HENT:     Head: Normocephalic and atraumatic.     Mouth/Throat:     Mouth: Mucous membranes are moist.  Eyes:     Extraocular Movements: Extraocular movements intact.     Pupils: Pupils are equal, round, and reactive to light.  Cardiovascular:     Rate and Rhythm: Normal rate and regular rhythm.     Pulses: Normal pulses.     Heart sounds: Normal heart sounds.  Pulmonary:     Effort: Pulmonary effort is normal.     Breath sounds: Normal breath sounds.  Abdominal:     General: Bowel sounds are normal.     Palpations: Abdomen is soft. There is no hepatomegaly or splenomegaly.     Tenderness: There is no abdominal tenderness.  Musculoskeletal:        General: No swelling.     Right lower leg: No edema.     Left lower leg: No edema.   Lymphadenopathy:     Cervical: No cervical adenopathy.  Skin:    General: Skin is warm and dry.     Comments: Faint dusky red erythema of fingers of bilateral hands.  Neurological:     General: No focal deficit present.     Mental Status: She is alert and oriented to person, place, and time.  Psychiatric:        Mood and Affect: Mood normal.        Behavior: Behavior normal.     LABORATORY DATA:  I have reviewed the labs as listed.  CBC    Component Value Date/Time   WBC 7.2 05/07/2021 1053   RBC 4.62 05/07/2021 1053   HGB 14.3 05/07/2021 1053   HGB 16.6 (H) 03/18/2021 0803   HCT 44.0 05/07/2021 1053   HCT 50.9 (H) 03/18/2021 0803   PLT 489 (H) 05/07/2021 1053   PLT 583 (H) 03/18/2021 0803   MCV 95.2 05/07/2021 1053   MCV 92 03/18/2021 0803   MCH 31.0 05/07/2021 1053   MCHC 32.5 05/07/2021 1053   RDW 14.6 05/07/2021 1053   RDW 13.9 03/18/2021 0803   LYMPHSABS 1.5 05/07/2021 1053   LYMPHSABS 2.0 03/18/2021 0803   MONOABS 0.4 05/07/2021 1053   EOSABS 0.2 05/07/2021 1053   EOSABS 0.3 03/18/2021 0803   BASOSABS 0.1 05/07/2021 1053   BASOSABS 0.1 03/18/2021 0803      Latest Ref Rng & Units 03/18/2021    8:03 AM 03/23/2020    8:08 AM 03/11/2019    8:37 AM  CMP  Glucose 70 - 99 mg/dL 95   89   92    BUN 8 - 27 mg/dL _0 Creatinine 0.57 - 1.00 mg/dL 0.83   0.80   0.88    Sodium 134 - 144 mmol/L 143   142   141    Potassium 3.5 - 5.2 mmol/L 5.1   4.7   4.5    Chloride 96 - 106 mmol/L 103   105   101    CO2 20 - 29 mmol/L _1 Calcium 8.7 - 10.3 mg/dL 10.1  9.3   9.7    Total Protein 6.0 - 8.5 g/dL 7.0   6.5   6.9    Total Bilirubin 0.0 - 1.2 mg/dL 0.4   0.3   0.3    Alkaline Phos 44 - 121 IU/L 95   89   90    AST 0 - 40 IU/L _0 ALT 0 - 32 IU/L _1 DIAGNOSTIC IMAGING:  I have independently reviewed the relevant imaging and discussed with the patient.  ASSESSMENT & PLAN: 1.  JAK2 V617F + essential thrombocytosis  versus polycythemia vera - Seen at the request of her primary care provider (NP Pearson Forster) for evaluation of elevated platelets and elevated hemoglobin. - Review of past CBCs shows history of mild thrombocytosis since at least 2012 with platelets 404.  Platelets appear to have been increasing over the past few years, with most recent CBC (03/18/2021) showing platelets 583.  CBC (03/18/2021) also showed new erythrocytosis with Hgb 16.6/HCT 50.9. - Lifelong non-smoker.  No history of autoimmune disease, connective tissue disorder, sleep apnea.  No diuretic medications. 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 - She only has one isolated episode of elevated Hgb/HCT, unclear at this time if clinical picture represents essential thrombocytosis or polycythemia vera - Symptomatic with aquagenic pruritus (lasting 5 to 10 minutes), and reddish discoloration of bilateral feet with tingling pain in her right foot.  She has some faint dusky red erythema of her bilateral fingers.  No B symptoms.   - She is taking aspirin 81 mg daily    - Started on Hydrea 500 mg MWF on 05/27/2021.  She is tolerating this well.   - Goal is to keep platelets <400 and HCT <45.0 to decrease risk of VTE.  However, if she is intolerant of Hydrea, could opt for conservative management with aspirin 81 mg alone as long as platelets are less than 500. - We have discussed pathophysiology of her JAK2+ MPN.  Given possible involvement of hemoglobin, this may represent polycythemia vera rather than essential thrombocytosis.  We discussed that main risk of her condition is risk of DVT, PE, MI, and CVA.  We discussed that there is also some inherent risk of progression to leukemia or myelofibrosis. - Labs today (06/24/2021): Platelets 429, Hgb 15.0/HCT 46.2.  Normal LDH and CMP. -  PLAN: Platelets and hematocrit not at goal.  We will increase Hydrea to 500 mg Monday through Friday. - Continue aspirin 81 mg daily - We will check x-ray today due to right leg pain and right foot pain described as "aching bone pain" - Due to splenomegaly, we will check bone marrow biopsy to evaluate for any myelofibrosis.  Referral sent for CT-guided bone marrow biopsy at [facility]. - Office visit in about 6 weeks (after bone marrow biopsy) for repeat labs and to discuss results  2.  History of right-sided stage Ia breast cancer - History of stage Ia right-sided breast cancer (ER/PR negative, HER2 positive), followed by Dr. Nicholas L[facility] at [facility]. - Right breast lumpectomy on 09/20/2018, followed by radiation therapy.  No role of adjuvant systemic chemotherapy or antiestrogen therapy because of the size of the tumor and the fact that it was estrogen  receptor negative. - She continues to follow with Dr. Lindi Adie for annual survivorship visits and checkups. - She is up-to-date on her mammograms, most recently performed on 08/25/2020, BI-RADS Category 2, benign.   3.  Other history - PMH:  Stage Ia breast cancer, osteoarthritis, asthma, seizures, vertigo, and osteopenia. - SOCIAL: Patient lives at home with her husband.  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  She denies any history of tobacco, alcohol, or illicit drug use. - FAMILY:  Denies any family history of blood disorders or myeloproliferative neoplasms.  She reports that she had several maternal uncles with unspecified types of cancer.  Maternal aunt with breast cancer.   All questions were answered. The patient knows to call the clinic with any problems, questions or concerns.  Medical decision making: Moderate   Time spent on visit: I spent 20 minutes counseling the patient face to face. The total time spent in the appointment was 30 minutes and more than 50% was on counseling.   Harriett Rush, PA-C  06/24/2021 3:03 PM

## 2021-06-24 ENCOUNTER — Ambulatory Visit (HOSPITAL_COMMUNITY)
Admission: RE | Admit: 2021-06-24 | Discharge: 2021-06-24 | Disposition: A | Discharge status: Home / Self Care | Payer: PPO | Source: Ambulatory Visit | Attending: Physician Assistant | Admitting: Physician Assistant

## 2021-06-24 ENCOUNTER — Inpatient Hospital Stay (HOSPITAL_COMMUNITY): Payer: PPO | Attending: Hematology | Admitting: Physician Assistant

## 2021-06-24 ENCOUNTER — Inpatient Hospital Stay (HOSPITAL_COMMUNITY): Payer: PPO

## 2021-06-24 ENCOUNTER — Telehealth (HOSPITAL_COMMUNITY): Payer: Self-pay | Admitting: Physician Assistant

## 2021-06-24 VITALS — BP 127/66 | HR 76 | Temp 97.7°F | Resp 16 | Ht 63.0 in | Wt 128.7 lb

## 2021-06-24 DIAGNOSIS — D473 Essential (hemorrhagic) thrombocythemia: Secondary | ICD-10-CM

## 2021-06-24 DIAGNOSIS — M79662 Pain in left lower leg: Secondary | ICD-10-CM | POA: Insufficient documentation

## 2021-06-24 DIAGNOSIS — M79604 Pain in right leg: Secondary | ICD-10-CM | POA: Insufficient documentation

## 2021-06-24 DIAGNOSIS — M79671 Pain in right foot: Secondary | ICD-10-CM

## 2021-06-24 DIAGNOSIS — R61 Generalized hyperhidrosis: Secondary | ICD-10-CM | POA: Insufficient documentation

## 2021-06-24 DIAGNOSIS — Z1589 Genetic susceptibility to other disease: Secondary | ICD-10-CM

## 2021-06-24 DIAGNOSIS — M8580 Other specified disorders of bone density and structure, unspecified site: Secondary | ICD-10-CM | POA: Insufficient documentation

## 2021-06-24 DIAGNOSIS — Z79899 Other long term (current) drug therapy: Secondary | ICD-10-CM | POA: Diagnosis not present

## 2021-06-24 DIAGNOSIS — M79661 Pain in right lower leg: Secondary | ICD-10-CM | POA: Insufficient documentation

## 2021-06-24 DIAGNOSIS — D751 Secondary polycythemia: Secondary | ICD-10-CM | POA: Diagnosis present

## 2021-06-24 DIAGNOSIS — Z7982 Long term (current) use of aspirin: Secondary | ICD-10-CM | POA: Insufficient documentation

## 2021-06-24 DIAGNOSIS — D471 Chronic myeloproliferative disease: Secondary | ICD-10-CM | POA: Diagnosis not present

## 2021-06-24 DIAGNOSIS — R252 Cramp and spasm: Secondary | ICD-10-CM | POA: Diagnosis not present

## 2021-06-24 LAB — CBC WITH DIFFERENTIAL/PLATELET
Abs Immature Granulocytes: 0.03 10*3/uL (ref 0.00–0.07)
Basophils Absolute: 0.1 10*3/uL (ref 0.0–0.1)
Basophils Relative: 2 %
Eosinophils Absolute: 0.2 10*3/uL (ref 0.0–0.5)
Eosinophils Relative: 3 %
HCT: 46.2 % — ABNORMAL HIGH (ref 36.0–46.0)
Hemoglobin: 15 g/dL (ref 12.0–15.0)
Immature Granulocytes: 0 %
Lymphocytes Relative: 20 %
Lymphs Abs: 1.5 10*3/uL (ref 0.7–4.0)
MCH: 31.4 pg (ref 26.0–34.0)
MCHC: 32.5 g/dL (ref 30.0–36.0)
MCV: 96.9 fL (ref 80.0–100.0)
Monocytes Absolute: 0.4 10*3/uL (ref 0.1–1.0)
Monocytes Relative: 6 %
Neutro Abs: 5.1 10*3/uL (ref 1.7–7.7)
Neutrophils Relative %: 69 %
Platelets: 429 10*3/uL — ABNORMAL HIGH (ref 150–400)
RBC: 4.77 MIL/uL (ref 3.87–5.11)
RDW: 15.3 % (ref 11.5–15.5)
WBC: 7.3 10*3/uL (ref 4.0–10.5)
nRBC: 0 % (ref 0.0–0.2)

## 2021-06-24 LAB — COMPREHENSIVE METABOLIC PANEL
ALT: 22 U/L (ref 0–44)
AST: 21 U/L (ref 15–41)
Albumin: 4 g/dL (ref 3.5–5.0)
Alkaline Phosphatase: 69 U/L (ref 38–126)
Anion gap: 8 (ref 5–15)
BUN: 15 mg/dL (ref 8–23)
CO2: 27 mmol/L (ref 22–32)
Calcium: 9.1 mg/dL (ref 8.9–10.3)
Chloride: 105 mmol/L (ref 98–111)
Creatinine, Ser: 0.78 mg/dL (ref 0.44–1.00)
GFR, Estimated: >60 mL/min (ref >60)
Glucose, Bld: 104 mg/dL — ABNORMAL HIGH (ref 70–99)
Potassium: 4.1 mmol/L (ref 3.5–5.1)
Sodium: 140 mmol/L (ref 135–145)
Total Bilirubin: 0.8 mg/dL (ref 0.3–1.2)
Total Protein: 6.6 g/dL (ref 6.5–8.1)

## 2021-06-24 LAB — LACTATE DEHYDROGENASE: LDH: 154 U/L (ref 98–192)

## 2021-06-24 MED ORDER — HYDROXYUREA 500 MG PO CAPS: ORAL_CAPSULE | ORAL | 3 refills | Status: DC

## 2021-06-24 NOTE — Patient Instructions (Signed)
[facility] at [facility] Discharge Instructions  You were seen today by Tarri Abernethy PA-C for your follow-up visit.  As we discussed, you have a type of low-grade blood cancer known as essential thrombocytosis/polycythemia vera which is secondary to a mutation that causes your bone marrow to produce too many blood cells.  Your abdominal ultrasound showed mild enlargement of your spleen, so we will also check a BONE MARROW BIOPSY to make sure you do not have any other more serious bone marrow problem.  Your blood counts are improved after starting Hydrea, but are not quite at their goal.  We will INCREASE your Hydrea.  You will need to take Hydrea (hydroxyurea) 1 tablet (500 mg) Monday through Friday.  I will check x-ray of your right leg and right foot due to your ongoing aching bone pain.  OTHER TESTS: You will be referred for bone marrow biopsy at [facility].  MEDICATIONS: Refill prescription for Hydrea 500 mg daily Monday through Friday has been sent to your pharmacy.  FOLLOW-UP APPOINTMENT: Same-day labs and office visit in 6 weeks.  - - - - - - - - - - - - - - - - -  Thank you for choosing [facility] at [facility] to provide your oncology and hematology care.  To afford each patient quality time with our provider, please arrive at least 15 minutes before your scheduled appointment time.   If you have a lab appointment with the [facility] please come in thru the Main Entrance and check in at the main information desk.  You need to re-schedule your appointment should you arrive 10 or more minutes late.  We strive to give you quality time with our providers, and arriving late affects you and other patients whose appointments are after yours.  Also, if you no show three or more times for appointments you may be dismissed from the clinic at the providers discretion.     Again, thank you for choosing [facility].   Our hope is that these requests will decrease the amount of time that you wait before being seen by our physicians.       _____________________________________________________________  Should you have questions after your visit to [facility], please contact our office at [phone] and follow the prompts.  Our office hours are 8:00 a.m. and 4:30 p.m. Monday - Friday.  Please note that voicemails left after 4:00 p.m. may not be returned until the following business day.  We are closed weekends and major holidays.  You do have access to a nurse 24-7, just call the main number to the clinic [phone] and do not press any options, hold on the line and a nurse will answer the phone.    For prescription refill requests, have your pharmacy contact our office and allow 72 hours.    Due to Covid, you will need to wear a mask upon entering the hospital. If you do not have a mask, a mask will be given to you at the Main Entrance upon arrival. For doctor visits, patients may have 1 support person age 87 or older with them. For treatment visits, patients can not have anyone with them due to social distancing guidelines and our immunocompromised population.

## 2021-06-28 ENCOUNTER — Encounter (HOSPITAL_COMMUNITY): Payer: Self-pay | Admitting: Physician Assistant

## 2021-07-02 ENCOUNTER — Other Ambulatory Visit: Payer: Self-pay | Admitting: Family Medicine

## 2021-07-22 ENCOUNTER — Other Ambulatory Visit: Payer: Self-pay | Admitting: Student

## 2021-07-22 DIAGNOSIS — D471 Chronic myeloproliferative disease: Secondary | ICD-10-CM

## 2021-07-23 ENCOUNTER — Ambulatory Visit (HOSPITAL_COMMUNITY)
Admission: RE | Admit: 2021-07-23 | Discharge: 2021-07-23 | Disposition: A | Discharge status: Home / Self Care | Payer: PPO | Source: Ambulatory Visit | Attending: Physician Assistant | Admitting: Physician Assistant

## 2021-07-23 ENCOUNTER — Encounter (HOSPITAL_COMMUNITY): Payer: Self-pay

## 2021-07-23 ENCOUNTER — Other Ambulatory Visit: Payer: Self-pay

## 2021-07-23 DIAGNOSIS — D751 Secondary polycythemia: Secondary | ICD-10-CM | POA: Diagnosis not present

## 2021-07-23 DIAGNOSIS — D471 Chronic myeloproliferative disease: Secondary | ICD-10-CM | POA: Insufficient documentation

## 2021-07-23 DIAGNOSIS — Z1589 Genetic susceptibility to other disease: Secondary | ICD-10-CM | POA: Diagnosis not present

## 2021-07-23 DIAGNOSIS — D45 Polycythemia vera: Secondary | ICD-10-CM | POA: Diagnosis not present

## 2021-07-23 LAB — CBC WITH DIFFERENTIAL/PLATELET
Abs Immature Granulocytes: 0.02 10*3/uL (ref 0.00–0.07)
Basophils Absolute: 0.1 10*3/uL (ref 0.0–0.1)
Basophils Relative: 2 %
Eosinophils Absolute: 0.2 10*3/uL (ref 0.0–0.5)
Eosinophils Relative: 3 %
HCT: 45.2 % (ref 36.0–46.0)
Hemoglobin: 14.9 g/dL (ref 12.0–15.0)
Immature Granulocytes: 0 %
Lymphocytes Relative: 24 %
Lymphs Abs: 1.6 10*3/uL (ref 0.7–4.0)
MCH: 32.5 pg (ref 26.0–34.0)
MCHC: 33 g/dL (ref 30.0–36.0)
MCV: 98.5 fL (ref 80.0–100.0)
Monocytes Absolute: 0.4 10*3/uL (ref 0.1–1.0)
Monocytes Relative: 6 %
Neutro Abs: 4.4 10*3/uL (ref 1.7–7.7)
Neutrophils Relative %: 65 %
Platelets: 328 10*3/uL (ref 150–400)
RBC: 4.59 MIL/uL (ref 3.87–5.11)
RDW: 16.2 % — ABNORMAL HIGH (ref 11.5–15.5)
WBC: 6.7 10*3/uL (ref 4.0–10.5)
nRBC: 0 % (ref 0.0–0.2)

## 2021-07-23 MED ORDER — FENTANYL CITRATE (PF) 100 MCG/2ML IJ SOLN
INTRAMUSCULAR | Status: AC
  Filled 2021-07-23: qty 2

## 2021-07-23 MED ORDER — MIDAZOLAM HCL 2 MG/2ML IJ SOLN
INTRAMUSCULAR | Status: AC
  Filled 2021-07-23: qty 4

## 2021-07-23 MED ORDER — LIDOCAINE HCL (PF) 1 % IJ SOLN
INTRAMUSCULAR | Status: DC | PRN
  Administered 2021-07-23: 20 mL

## 2021-07-23 MED ORDER — SODIUM CHLORIDE 0.9 % IV SOLN: INTRAVENOUS | Status: DC

## 2021-07-23 MED ORDER — MIDAZOLAM HCL 2 MG/2ML IJ SOLN
INTRAMUSCULAR | Status: AC | PRN
  Administered 2021-07-23 (×2): .5 mg via INTRAVENOUS

## 2021-07-23 MED ORDER — FENTANYL CITRATE (PF) 100 MCG/2ML IJ SOLN
INTRAMUSCULAR | Status: AC | PRN
  Administered 2021-07-23 (×2): 50 ug via INTRAVENOUS

## 2021-07-23 NOTE — Procedures (Signed)
Vascular and Interventional Radiology Procedure Note  Patient: Yvonne Maynard DOB: 06-30-1940 Medical Record Number: [id number] Note Date/Time: 07/23/21 9:11 AM   Performing Physician: Michaelle Birks, MD Assistant(s): None  Diagnosis: JAK2 mutation. Polycythemia vs MF  Procedure: BONE MARROW ASPIRATION and BIOPSY  Anesthesia: Conscious Sedation Complications: None Estimated Blood Loss: Minimal Specimens: Sent for Pathology  Findings:  Successful CT-guided bone marrow aspiration and biopsy A total of 1 cores were obtained. Hemostasis of the tract was achieved using Manual Pressure.  Plan: Bed rest for 1 hours.  See detailed procedure note with images in PACS. The patient tolerated the procedure well without incident or complication and was returned to Recovery in stable condition.    Michaelle Birks, MD Vascular and Interventional Radiology Specialists [facility] Radiology   Pager. [place]

## 2021-07-23 NOTE — Discharge Instructions (Signed)
Discharge Instructions:   Please call Interventional Radiology clinic [phone] with any questions or concerns.  You may remove your dressing and shower tomorrow.    Bone Marrow Aspiration and Bone Marrow Biopsy, Adult, Care After This sheet gives you information about how to care for yourself after your procedure. Your health care provider may also give you more specific instructions. If you have problems or questions, contact your health care provider. What can I expect after the procedure? After the procedure, it is common to have: Mild pain and tenderness. Swelling. Bruising. Follow these instructions at home: Puncture site care  Follow instructions from your health care provider about how to take care of the puncture site. Make sure you: Wash your hands with soap and water before and after you change your bandage (dressing). If soap and water are not available, use hand sanitizer. Change your dressing as told by your health care provider. Check your puncture site every day for signs of infection. Check for: More redness, swelling, or pain. Fluid or blood. Warmth. Pus or a bad smell. Activity Return to your normal activities as told by your health care provider. Ask your health care provider what activities are safe for you. Do not lift anything that is heavier than 10 lb (4.5 kg), or the limit that you are told, until your health care provider says that it is safe. Do not drive for 24 hours if you were given a sedative during your procedure. General instructions  Take over-the-counter and prescription medicines only as told by your health care provider. Do not take baths, swim, or use a hot tub until your health care provider approves. Ask your health care provider if you may take showers. You may only be allowed to take sponge baths. If directed, put ice on the affected area. To do this: Put ice in a plastic bag. Place a towel between your skin and the bag. Leave the ice  on for 20 minutes, 2-3 times a day. Keep all follow-up visits as told by your health care provider. This is important. Contact a health care provider if: Your pain is not controlled with medicine. You have a fever. You have more redness, swelling, or pain around the puncture site. You have fluid or blood coming from the puncture site. Your puncture site feels warm to the touch. You have pus or a bad smell coming from the puncture site. Summary After the procedure, it is common to have mild pain, tenderness, swelling, and bruising. Follow instructions from your health care provider about how to take care of the puncture site and what activities are safe for you. Take over-the-counter and prescription medicines only as told by your health care provider. Contact a health care provider if you have any signs of infection, such as fluid or blood coming from the puncture site. This information is not intended to replace advice given to you by your health care provider. Make sure you discuss any questions you have with your health care provider. Document Revised: 05/22/2018 Document Reviewed: 05/22/2018 Elsevier Patient Education  2023 Elsevier Inc.   Moderate Conscious Sedation, Adult, Care After This sheet gives you information about how to care for yourself after your procedure. Your health care provider may also give you more specific instructions. If you have problems or questions, contact your health care provider. What can I expect after the procedure? After the procedure, it is common to have: Sleepiness for several hours. Impaired judgment for several hours. Difficulty with balance. Vomiting if   you eat too soon. Follow these instructions at home: For the time period you were told by your health care provider: Rest. Do not participate in activities where you could fall or become injured. Do not drive or use machinery. Do not drink alcohol. Do not take sleeping pills or medicines that  cause drowsiness. Do not make important decisions or sign legal documents. Do not take care of children on your own. Eating and drinking  Follow the diet recommended by your health care provider. Drink enough fluid to keep your urine pale yellow. If you vomit: Drink water, juice, or soup when you can drink without vomiting. Make sure you have little or no nausea before eating solid foods. General instructions Take over-the-counter and prescription medicines only as told by your health care provider. Have a responsible adult stay with you for the time you are told. It is important to have someone help care for you until you are awake and alert. Do not smoke. Keep all follow-up visits as told by your health care provider. This is important. Contact a health care provider if: You are still sleepy or having trouble with balance after 24 hours. You feel light-headed. You keep feeling nauseous or you keep vomiting. You develop a rash. You have a fever. You have redness or swelling around the IV site. Get help right away if: You have trouble breathing. You have new-onset confusion at home. Summary After the procedure, it is common to feel sleepy, have impaired judgment, or feel nauseous if you eat too soon. Rest after you get home. Know the things you should not do after the procedure. Follow the diet recommended by your health care provider and drink enough fluid to keep your urine pale yellow. Get help right away if you have trouble breathing or new-onset confusion at home. This information is not intended to replace advice given to you by your health care provider. Make sure you discuss any questions you have with your health care provider. Document Revised: 05/03/2019 Document Reviewed: 11/29/2018 Elsevier Patient Education  2023 Elsevier Inc.  

## 2021-07-23 NOTE — H&P (Signed)
Chief Complaint: Patient was seen in consultation today for bone marrow biopsy  Referring Physician(s): [place] M  Supervising Physician: Michaelle Birks  Patient Status: [place]  History of Present Illness: Yvonne Maynard is a 81 y.o. female being worked up for polycythemia vs myelofibrosis. She is referred for bone marrow biopsy. PMHx, meds, labs, imaging, allergies reviewed. Feels well, no recent fevers, chills, illness. Has been NPO today as directed.   Past Medical History:  Diagnosis Date   Aortic atherosclerosis ([place]) 06/07/2020   Seen on cervical xray 06/07/20   Asthma    None in years   Cancer [facility])    skin cancer nose    Cerebrovascular disease    Diverticulosis of colon 08/19/2010   Per CT scan   DJD (degenerative joint disease) of cervical spine    Generalized convulsive seizures ([place]) 30 YEARS AGO   Remotely, following head injury from [place] degeneration    Osteopenia    Seizure ([place])    none since before 2000   Vertigo 08/2010    Past Surgical History:  Procedure Laterality Date   AXILLARY SENTINEL NODE BIOPSY Right 10/05/2018   Procedure: RIGHT AXILLARY SENTINEL LYMPH NODE BIOPSY;  Surgeon: Alphonsa Overall, MD;  Location: [place];  Service: General;  Laterality: Right;   BREAST LUMPECTOMY WITH RADIOACTIVE SEED LOCALIZATION Right 09/21/2018   Procedure: RIGHT BREAST LUMPECTOMY WITH RADIOACTIVE SEED LOCALIZATION;  Surgeon: Alphonsa Overall, MD;  Location: [place];  Service: General;  Laterality: Right;   COLONOSCOPY     TONSILLECTOMY     TUBAL LIGATION     TUBAL LIGATION      Allergies: Patient has no known allergies.  Medications: Prior to Admission medications   Medication Sig Start Date End Date Taking? Authorizing Provider  albuterol (VENTOLIN HFA) 108 (90 Base) MCG/ACT inhaler Inhale 2 puffs into the lungs 2 (two) times daily as needed for wheezing or shortness of breath. 08/12/19  Yes Lovena Le, Malena M, DO   aspirin EC 81 MG tablet Take 81 mg by mouth at bedtime.   Yes [provider]  Calcium Carbonate-Vit D-Min (CALTRATE 600+D PLUS MINERALS) 600-800 MG-UNIT CHEW    Yes [provider]  cycloSPORINE (RESTASIS) 0.05 % ophthalmic emulsion Place 1 drop into both eyes 2 (two) times daily. 09/03/20  Yes Nicholas Lose, MD  hydroxyurea (HYDREA) 500 MG capsule Take 500 mg (1 capsule) daily Monday through Friday.  May take with food to minimize GI side effects. 06/25/21  Yes Pennington, Rebekah M, PA-C  montelukast (SINGULAIR) 10 MG tablet TAKE ONE TABLET BY MOUTH ONCE DAILY. 07/02/21  Yes Luking, Elayne Snare, MD  Multiple Vitamins-Minerals (CENTRUM SILVER 50+WOMEN PO)    Yes [provider]  Multiple Vitamins-Minerals (PRESERVISION AREDS 2 PO) Take by mouth 2 (two) times daily.   Yes [provider]  Omega-3 Fatty Acids (FISH OIL) 1000 MG CAPS Take 1,000 mg by mouth daily.   Yes [provider]  OVER THE COUNTER MEDICATION chlorimaletae 26m one every 4 hours prn allergies   Yes [provider]  triamcinolone (NASACORT) 55 MCG/ACT AERO nasal inhaler Place 2 sprays into the nose daily.   Yes [provider]     Family History  Problem Relation Age of Onset   Crohn's disease Mother    Colon cancer Maternal Uncle 845   Social History   Socioeconomic History   Marital status: Married    Spouse name: JJeneen Rinks  Number of children: 3   Years of education: Not on file   Highest education level: Not on file  Occupational History   Not on file  Tobacco Use   Smoking status: Never   Smokeless tobacco: Never  Vaping Use   Vaping Use: Never used  Substance and Sexual Activity   Alcohol use: No   Drug use: No   Sexual activity: Yes  Other Topics Concern   Not on file  Social History Narrative   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   Social Determinants of Health   Financial Resource Strain: Low Risk   (11/17/2020)   Overall Financial Resource Strain (CARDIA)    Difficulty of Paying Living Expenses: Not hard at all  Food Insecurity: No Food Insecurity (11/17/2020)   Hunger Vital Sign    Worried About Running Out of Food in the Last Year: Never true    Ran Out of Food in the Last Year: Never true  Transportation Needs: No Transportation Needs (11/17/2020)   PRAPARE - Hydrologist (Medical): No    Lack of Transportation (Non-Medical): No  Physical Activity: Sufficiently Active (11/17/2020)   Exercise Vital Sign    Days of Exercise per Week: 5 days    Minutes of Exercise per Session: 30 min  Stress: No Stress Concern Present (11/17/2020)   [place]    Feeling of Stress : Not at all  Social Connections: [place] (11/17/2020)   Social Connection and Isolation Panel [NHANES]    Frequency of Communication with Friends and Family: More than three times a week    Frequency of Social Gatherings with Friends and Family: More than three times a week    Attends Religious Services: More than 4 times per year    Active Member of [employer] or Organizations: Yes    Attends Music therapist: More than 4 times per year    Marital Status: Married    Review of Systems: A 12 point ROS discussed and pertinent positives are indicated in the HPI above.  All other systems are negative.  Review of Systems  Vital Signs: BP 133/61   Pulse 67   Temp 98.3 F (36.8 C) (Oral)   Resp 18   Ht '5\' 3"'  (1.6 m)   Wt 59 kg   SpO2 99%   BMI 23.03 kg/m     Physical Exam Constitutional:      Appearance: Normal appearance. She is not ill-appearing.  HENT:     Mouth/Throat:     Mouth: Mucous membranes are moist.     Pharynx: Oropharynx is clear.  Cardiovascular:     Rate and Rhythm: Normal rate and regular rhythm.     Heart sounds: Normal heart sounds.  Pulmonary:     Effort: Pulmonary effort is  normal. No respiratory distress.     Breath sounds: Normal breath sounds.  Skin:    General: Skin is warm and dry.  Neurological:     General: No focal deficit present.     Mental Status: She is alert and oriented to person, place, and time.  Psychiatric:        Mood and Affect: Mood normal.     Imaging: DG Tibia/Fibula Right  Result Date: 06/25/2021 CLINICAL DATA:  Chronic right leg pain. EXAM: RIGHT TIBIA AND FIBULA - 2 VIEW COMPARISON:  None Available. FINDINGS: Mild medial compartment of the knee joint space  narrowing. The ankle mortise is symmetric and intact. Small density overlying the proximal lateral tibial metaphysis on frontal view is not definitely visualized on lateral view within the bone, and may correspond to a mineralized density of similar size just posterior to the tibial plateau a possible loose body. No acute fracture is seen. No dislocation. IMPRESSION: Mild medial compartment of the knee osteoarthritis. Possible posterolateral knee loose body. Electronically Signed   By: Yvonne Kendall M.D.   On: 06/25/2021 18:25   DG Foot 2 Views Right  Result Date: 06/25/2021 CLINICAL DATA:  Right foot pain.  Chronic. EXAM: RIGHT FOOT - 2 VIEW COMPARISON:  None Available. FINDINGS: Severe great toe metatarsophalangeal joint space narrowing with diffuse bone-on-bone contact and cortical flattening/remodeling. Large dorsal great toe metatarsal head degenerative osteophytes. Tiny plantar calcaneal heel spur. No acute fracture or dislocation. IMPRESSION: Severe great toe metatarsophalangeal joint osteoarthritis with bone-on-bone contact and large dorsal metatarsal head degenerative osteophytes. Electronically Signed   By: Yvonne Kendall M.D.   On: 06/25/2021 18:23    Labs:  CBC: Recent Labs    03/18/21 0803 05/07/21 1053 06/24/21 1009  WBC 8.1 7.2 7.3  HGB 16.6* 14.3 15.0  HCT 50.9* 44.0 46.2*  PLT 583* 489* 429*    COAGS: No results for input(s): "INR", "APTT" in the last 8760  hours.  BMP: Recent Labs    03/18/21 0803 06/24/21 1009  NA 143 140  K 5.1 4.1  CL 103 105  CO2 25 27  GLUCOSE 95 104*  BUN 16 15  CALCIUM 10.1 9.1  CREATININE 0.83 0.78  GFRNONAA  --  >60    LIVER FUNCTION TESTS: Recent Labs    03/18/21 0803 06/24/21 1009  BILITOT 0.4 0.8  AST 19 21  ALT 20 22  ALKPHOS 95 69  PROT 7.0 6.6  ALBUMIN 4.8* 4.0    TUMOR MARKERS: No results for input(s): "AFPTM", "CEA", "CA199", "CHROMGRNA" in the last 8760 hours.  Assessment and Plan: Thrombocytosis Being worked up for polycythemia vs myelofibrosis For image guided bone marrow biopsy Risks and benefits of bone marrow bx was discussed with the patient and/or patient's family including, but not limited to bleeding, infection, damage to adjacent structures or low yield requiring additional tests.  All of the questions were answered and there is agreement to proceed.  Consent signed and in chart.   Thank you for this interesting consult.  I greatly enjoyed meeting SHADELL BRENN and look forward to participating in their care.  A copy of this report was sent to the requesting provider on this date.  Electronically Signed: Ascencion Dike, PA-C 07/23/2021, 7:42 AM   I spent a total of 20 minutes in face to face in clinical consultation, greater than 50% of which was counseling/coordinating care for bone marrow biopsy

## 2021-07-23 NOTE — Progress Notes (Signed)
Arrived at 38

## 2021-07-29 ENCOUNTER — Other Ambulatory Visit: Payer: Self-pay | Admitting: Family Medicine

## 2021-08-02 ENCOUNTER — Encounter (HOSPITAL_COMMUNITY): Payer: Self-pay | Admitting: Hematology and Oncology

## 2021-08-02 NOTE — Progress Notes (Unsigned)
[place],  [id number]   CLINIC:  Medical Oncology/Hematology  PCP:  Kathyrn Drown, MD [address] [place] [address] [phone]   REASON FOR VISIT:  Follow-up for JAK2 essential thrombocytosis  PRIOR THERAPY: None  CURRENT THERAPY: Hydrea 500 mg Monday through Friday ***  INTERVAL HISTORY:  Ms. Ivie 81 y.o. female returns for routine follow-up of her recently diagnosed JAK2 essential thrombocytosis.  She was last seen by Tarri Abernethy PA-C on 06/24/2021.  At today's visit, she reports feeling well.  ***  She has not had any changes in her symptoms or health status since her visit last month.  She is taking Hydrea 500 mg Monday through Friday. ***   She is tolerating this well.  She denies any mouth ulcers, skin sores, or GI side effects.   *** Her energy levels have been good.  She reports that she has not noticed her aquagenic pruritus as much since starting Hydrea.  *** *** She does continue to have occasional reddish discoloration in her feet and fingers. *** She continues to complain of right foot and right lower leg pain as well as bilateral leg cramps at night. *** No Raynaud's phenomenon or other vasomotor symptoms such as dizziness, tinnitus, blurry vision, strokelike symptoms, or neuropathy. *** She has occasional night sweats.  She denies any unexplained fever, chills, or weight loss. *** No abdominal pain nausea, or early satiety.  She has  *** % energy and  *** % appetite. She endorses that she is maintaining a stable weight.   REVIEW OF SYSTEMS:    ***  Review of Systems  Constitutional:  Positive for fatigue (mild, baseline). Negative for appetite change, chills, diaphoresis, fever and unexpected weight change.  HENT:   Negative for lump/mass and nosebleeds.   Eyes:  Negative for eye problems.  Respiratory:  Positive for cough. Negative for hemoptysis and shortness of breath.   Cardiovascular:   Negative for chest pain, leg swelling and palpitations.  Gastrointestinal:  Negative for abdominal pain, blood in stool, constipation, diarrhea, nausea and vomiting.  Genitourinary:  Positive for frequency. Negative for hematuria.   Musculoskeletal:  Positive for arthralgias (aching right foot and right leg "bone pain").  Skin: Negative.   Neurological:  Positive for numbness (burning pain right foot). Negative for dizziness, headaches and light-headedness.  Hematological:  Does not bruise/bleed easily.      PAST MEDICAL/SURGICAL HISTORY:  Past Medical History:  Diagnosis Date   Aortic atherosclerosis ([place]) 06/07/2020   Seen on cervical xray 06/07/20   Asthma    None in years   Cancer [facility])    skin cancer nose    Cerebrovascular disease    Diverticulosis of colon 08/19/2010   Per CT scan   DJD (degenerative joint disease) of cervical spine    Generalized convulsive seizures ([place]) 30 YEARS AGO   Remotely, following head injury from [place] degeneration    Osteopenia    Seizure ([place])    none since before 2000   Vertigo 08/2010   Past Surgical History:  Procedure Laterality Date   AXILLARY SENTINEL NODE BIOPSY Right 10/05/2018   Procedure: RIGHT AXILLARY SENTINEL LYMPH NODE BIOPSY;  Surgeon: Alphonsa Overall, MD;  Location: [place];  Service: General;  Laterality: Right;   BREAST LUMPECTOMY WITH RADIOACTIVE SEED LOCALIZATION Right 09/21/2018   Procedure: RIGHT BREAST LUMPECTOMY WITH RADIOACTIVE SEED LOCALIZATION;  Surgeon: Alphonsa Overall, MD;  Location: [facility];  Service: General;  Laterality: Right;   COLONOSCOPY     TONSILLECTOMY     TUBAL LIGATION     TUBAL LIGATION       SOCIAL HISTORY:  Social History   Socioeconomic History   Marital status: Married    Spouse name: Jeneen Rinks   Number of children: 3   Years of education: Not on file   Highest education level: Not on file  Occupational History   Not on file  Tobacco Use   Smoking status: Never    Smokeless tobacco: Never  Vaping Use   Vaping Use: Never used  Substance and Sexual Activity   Alcohol use: No   Drug use: No   Sexual activity: Yes  Other Topics Concern   Not on file  Social History Narrative   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   Social Determinants of Health   Financial Resource Strain: Low Risk  (11/17/2020)   Overall Financial Resource Strain (CARDIA)    Difficulty of Paying Living Expenses: Not hard at all  Food Insecurity: No Food Insecurity (11/17/2020)   Hunger Vital Sign    Worried About Running Out of Food in the Last Year: Never true    Ran Out of Food in the Last Year: Never true  Transportation Needs: No Transportation Needs (11/17/2020)   PRAPARE - Hydrologist (Medical): No    Lack of Transportation (Non-Medical): No  Physical Activity: Sufficiently Active (11/17/2020)   Exercise Vital Sign    Days of Exercise per Week: 5 days    Minutes of Exercise per Session: 30 min  Stress: No Stress Concern Present (11/17/2020)   [place]    Feeling of Stress : Not at all  Social Connections: [place] (11/17/2020)   Social Connection and Isolation Panel [NHANES]    Frequency of Communication with Friends and Family: More than three times a week    Frequency of Social Gatherings with Friends and Family: More than three times a week    Attends Religious Services: More than 4 times per year    Active Member of [employer] or Organizations: Yes    Attends Music therapist: More than 4 times per year    Marital Status: Married  Human resources officer Violence: Not At Risk (11/17/2020)   Humiliation, Afraid, Rape, and Kick questionnaire    Fear of Current or Ex-Partner: No    Emotionally Abused: No    Physically Abused: No    Sexually Abused: No    FAMILY HISTORY:  Family History  Problem Relation Age of Onset    Crohn's disease Mother    Colon cancer Maternal Uncle 28    CURRENT MEDICATIONS:  Outpatient Encounter Medications as of 08/03/2021  Medication Sig   albuterol (VENTOLIN HFA) 108 (90 Base) MCG/ACT inhaler Inhale 2 puffs into the lungs 2 (two) times daily as needed for wheezing or shortness of breath.   aspirin EC 81 MG tablet Take 81 mg by mouth at bedtime.   Calcium Carbonate-Vit D-Min (CALTRATE 600+D PLUS MINERALS) 600-800 MG-UNIT CHEW    cycloSPORINE (RESTASIS) 0.05 % ophthalmic emulsion Place 1 drop into both eyes 2 (two) times daily.   hydroxyurea (HYDREA) 500 MG capsule Take 500 mg (1 capsule) daily Monday through Friday.  May take with food to minimize GI side effects.   montelukast (SINGULAIR) 10 MG tablet TAKE ONE TABLET BY MOUTH ONCE  DAILY.   Multiple Vitamins-Minerals (CENTRUM SILVER 50+WOMEN PO)    Multiple Vitamins-Minerals (PRESERVISION AREDS 2 PO) Take by mouth 2 (two) times daily.   Omega-3 Fatty Acids (FISH OIL) 1000 MG CAPS Take 1,000 mg by mouth daily.   OVER THE COUNTER MEDICATION chlorimaletae 62m one every 4 hours prn allergies   triamcinolone (NASACORT) 55 MCG/ACT AERO nasal inhaler Place 2 sprays into the nose daily.   No facility-administered encounter medications on file as of 08/03/2021.    ALLERGIES:  No Known Allergies   PHYSICAL EXAM:  ***  ECOG PERFORMANCE STATUS: 1 - Symptomatic but completely ambulatory    There were no vitals filed for this visit. There were no vitals filed for this visit. Physical Exam Constitutional:      Appearance: Normal appearance.  HENT:     Head: Normocephalic and atraumatic.     Mouth/Throat:     Mouth: Mucous membranes are moist.  Eyes:     Extraocular Movements: Extraocular movements intact.     Pupils: Pupils are equal, round, and reactive to light.  Cardiovascular:     Rate and Rhythm: Normal rate and regular rhythm.     Pulses: Normal pulses.     Heart sounds: Normal heart sounds.  Pulmonary:     Effort:  Pulmonary effort is normal.     Breath sounds: Normal breath sounds.  Abdominal:     General: Bowel sounds are normal.     Palpations: Abdomen is soft. There is no hepatomegaly or splenomegaly.     Tenderness: There is no abdominal tenderness.  Musculoskeletal:        General: No swelling.     Right lower leg: No edema.     Left lower leg: No edema.  Lymphadenopathy:     Cervical: No cervical adenopathy.  Skin:    General: Skin is warm and dry.     Comments: Faint dusky red erythema of fingers of bilateral hands.  Neurological:     General: No focal deficit present.     Mental Status: She is alert and oriented to person, place, and time.  Psychiatric:        Mood and Affect: Mood normal.        Behavior: Behavior normal.      LABORATORY DATA:  I have reviewed the labs as listed.  CBC    Component Value Date/Time   WBC 6.7 07/23/2021 0725   RBC 4.59 07/23/2021 0725   HGB 14.9 07/23/2021 0725   HGB 16.6 (H) 03/18/2021 0803   HCT 45.2 07/23/2021 0725   HCT 50.9 (H) 03/18/2021 0803   PLT 328 07/23/2021 0725   PLT 583 (H) 03/18/2021 0803   MCV 98.5 07/23/2021 0725   MCV 92 03/18/2021 0803   MCH 32.5 07/23/2021 0725   MCHC 33.0 07/23/2021 0725   RDW 16.2 (H) 07/23/2021 0725   RDW 13.9 03/18/2021 0803   LYMPHSABS 1.6 07/23/2021 0725   LYMPHSABS 2.0 03/18/2021 0803   MONOABS 0.4 07/23/2021 0725   EOSABS 0.2 07/23/2021 0725   EOSABS 0.3 03/18/2021 0803   BASOSABS 0.1 07/23/2021 0725   BASOSABS 0.1 03/18/2021 0803      Latest Ref Rng & Units 06/24/2021   10:09 AM 03/18/2021    8:03 AM 03/23/2020    8:08 AM  CMP  Glucose 70 - 99 mg/dL 104  95  89   BUN 8 - 23 mg/dL '15  16  12   ' Creatinine 0.44 - 1.00 mg/dL 0.78  0.83  0.80   Sodium 135 - 145 mmol/L 140  143  142   Potassium 3.5 - 5.1 mmol/L 4.1  5.1  4.7   Chloride 98 - 111 mmol/L 105  103  105   CO2 22 - 32 mmol/L '27  25  23   ' Calcium 8.9 - 10.3 mg/dL 9.1  10.1  9.3   Total Protein 6.5 - 8.1 g/dL 6.6  7.0  6.5    Total Bilirubin 0.3 - 1.2 mg/dL 0.8  0.4  0.3   Alkaline Phos 38 - 126 U/L 69  95  89   AST 15 - 41 U/L '21  19  21   ' ALT 0 - 44 U/L '22  20  21     ' DIAGNOSTIC IMAGING:  I have independently reviewed the relevant imaging and discussed with the patient.  ASSESSMENT & PLAN: 1.  JAK2 V617F + essential thrombocytosis versus polycythemia vera - Seen at the request of her primary care provider (NP Pearson Forster) for evaluation of elevated platelets and elevated hemoglobin. - Review of past CBCs shows history of mild thrombocytosis since at least 2012 with platelets 404.  Platelets appear to have been increasing over the past few years, with most recent CBC (03/18/2021) showing platelets 583.  CBC (03/18/2021) also showed new erythrocytosis with Hgb 16.6/HCT 50.9. - Lifelong non-smoker.  No history of autoimmune disease, connective tissue disorder, sleep apnea.  No diuretic medications. 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 - Bone marrow biopsy (07/23/2021): Hypercellular bone marrow with pan myeloid proliferation.  No significant increase in reticulin fibers.  Cytogenetics normal with 46,XX[5] - She only has one isolated episode of elevated Hgb/HCT, unclear at this time if clinical picture represents essential thrombocytosis or polycythemia vera - Symptomatic with aquagenic pruritus (lasting 5 to 10 minutes), and reddish discoloration of bilateral feet with tingling pain in her right foot.  She has some faint dusky red erythema of her bilateral fingers.  No B symptoms.   *** - She has right leg and foot pain, with x-ray (06/24/2021) showing moderate to severe osteoarthritis with osteophytes. - She is taking aspirin 81 mg daily    ***  - Started Hydrea on 05/27/2021.  Current dose is hydroxyurea 500 mg Monday through Friday.  She is  tolerating this well.   ***  - Goal is to keep platelets <400 and HCT <45.0 to decrease risk of VTE.  However, if she is intolerant of Hydrea, could opt for conservative management with aspirin 81 mg alone as long as platelets are less than 500. - We have discussed pathophysiology of her JAK2+ MPN.  Given possible involvement of hemoglobin, this may represent polycythemia vera rather than essential thrombocytosis.  We discussed that main risk of her condition is risk of DVT, PE, MI, and CVA.  We discussed that there is also some inherent risk of progression to leukemia or myelofibrosis. - Labs today (08/03/2021): *** - PLAN: Platelets and hematocrit not at goal.  ***  We will increase Hydrea to 500 mg Monday through Friday. - Continue aspirin 81 mg daily - Repeat labs and RTC in ***  2.  History of right-sided stage Ia breast cancer - History of stage Ia right-sided breast cancer (ER/PR negative, HER2 positive), followed [facility] Dr. Nicholas [facility]. - Right  breast lumpectomy on 09/20/2018, followed by radiation therapy.  No role of adjuvant systemic chemotherapy or antiestrogen therapy because of the size of the tumor and the fact that it was estrogen receptor negative. - She continues to follow with Dr. Lindi Adie for annual survivorship visits and checkups. - She is up-to-date on her mammograms, most recently performed on 08/25/2020, BI-RADS Category 2, benign.   3.  Other history - PMH:  Stage Ia breast cancer, osteoarthritis, asthma, seizures, vertigo, and osteopenia. - SOCIAL: Patient lives at home with her husband.  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  She denies any history of tobacco, alcohol, or illicit drug use. - FAMILY:  Denies any family history of blood disorders or myeloproliferative neoplasms.  She reports that she had several maternal uncles with unspecified types of cancer.  Maternal aunt with breast cancer.   All questions were answered.  The patient knows to call the clinic with any problems, questions or concerns.  Medical decision making: Moderate  ***   Time spent on visit: I spent 20 minutes counseling the patient face to face. The total time spent in the appointment was 30 minutes and more than 50% was on counseling.   Harriett Rush, PA-C   ***

## 2021-08-03 ENCOUNTER — Inpatient Hospital Stay (HOSPITAL_COMMUNITY): Payer: PPO | Attending: Hematology | Admitting: Physician Assistant

## 2021-08-03 ENCOUNTER — Inpatient Hospital Stay (HOSPITAL_COMMUNITY): Payer: PPO

## 2021-08-03 VITALS — BP 126/63 | HR 76 | Temp 97.9°F | Resp 18 | Wt 131.0 lb

## 2021-08-03 DIAGNOSIS — D473 Essential (hemorrhagic) thrombocythemia: Secondary | ICD-10-CM | POA: Insufficient documentation

## 2021-08-03 DIAGNOSIS — D471 Chronic myeloproliferative disease: Secondary | ICD-10-CM

## 2021-08-03 DIAGNOSIS — Z853 Personal history of malignant neoplasm of breast: Secondary | ICD-10-CM | POA: Diagnosis not present

## 2021-08-03 DIAGNOSIS — D75839 Thrombocytosis, unspecified: Secondary | ICD-10-CM | POA: Diagnosis not present

## 2021-08-03 DIAGNOSIS — Z1589 Genetic susceptibility to other disease: Secondary | ICD-10-CM | POA: Diagnosis not present

## 2021-08-03 DIAGNOSIS — Z7982 Long term (current) use of aspirin: Secondary | ICD-10-CM | POA: Insufficient documentation

## 2021-08-03 LAB — COMPREHENSIVE METABOLIC PANEL
ALT: 22 U/L (ref 0–44)
AST: 22 U/L (ref 15–41)
Albumin: 3.9 g/dL (ref 3.5–5.0)
Alkaline Phosphatase: 66 U/L (ref 38–126)
Anion gap: 5 (ref 5–15)
BUN: 12 mg/dL (ref 8–23)
CO2: 24 mmol/L (ref 22–32)
Calcium: 8.8 mg/dL — ABNORMAL LOW (ref 8.9–10.3)
Chloride: 111 mmol/L (ref 98–111)
Creatinine, Ser: 0.65 mg/dL (ref 0.44–1.00)
GFR, Estimated: >60 mL/min (ref >60)
Glucose, Bld: 128 mg/dL — ABNORMAL HIGH (ref 70–99)
Potassium: 3.9 mmol/L (ref 3.5–5.1)
Sodium: 140 mmol/L (ref 135–145)
Total Bilirubin: 0.6 mg/dL (ref 0.3–1.2)
Total Protein: 6.5 g/dL (ref 6.5–8.1)

## 2021-08-03 LAB — CBC WITH DIFFERENTIAL/PLATELET
Abs Immature Granulocytes: 0.03 10*3/uL (ref 0.00–0.07)
Basophils Absolute: 0.1 10*3/uL (ref 0.0–0.1)
Basophils Relative: 2 %
Eosinophils Absolute: 0.2 10*3/uL (ref 0.0–0.5)
Eosinophils Relative: 2 %
HCT: 40.2 % (ref 36.0–46.0)
Hemoglobin: 13.6 g/dL (ref 12.0–15.0)
Immature Granulocytes: 1 %
Lymphocytes Relative: 22 %
Lymphs Abs: 1.4 10*3/uL (ref 0.7–4.0)
MCH: 33 pg (ref 26.0–34.0)
MCHC: 33.8 g/dL (ref 30.0–36.0)
MCV: 97.6 fL (ref 80.0–100.0)
Monocytes Absolute: 0.4 10*3/uL (ref 0.1–1.0)
Monocytes Relative: 6 %
Neutro Abs: 4.5 10*3/uL (ref 1.7–7.7)
Neutrophils Relative %: 67 %
Platelets: 373 10*3/uL (ref 150–400)
RBC: 4.12 MIL/uL (ref 3.87–5.11)
RDW: 16.1 % — ABNORMAL HIGH (ref 11.5–15.5)
WBC: 6.6 10*3/uL (ref 4.0–10.5)
nRBC: 0 % (ref 0.0–0.2)

## 2021-08-03 LAB — SURGICAL PATHOLOGY

## 2021-08-03 LAB — LACTATE DEHYDROGENASE: LDH: 147 U/L (ref 98–192)

## 2021-08-03 NOTE — Progress Notes (Signed)
Patient is taking Hydrea as prescribed.  She has not missed any doses except for one day and reports no side effects at this time other than her neck has been red and itching.

## 2021-08-03 NOTE — Patient Instructions (Addendum)
[place] at [facility] Discharge Instructions  You were seen today by Tarri Abernethy PA-C for your follow-up visit.  As we discussed, you have a type of low-grade blood cancer known as essential thrombocytosis/polycythemia vera which is secondary to a mutation that causes your bone marrow to produce too many blood cells.  Your bone marrow biopsy was consistent with essential thrombocytosis / polycythemia vera.  It did NOT show any signs of myelofibrosis or more serious bone marrow problem.  Your blood counts look great today!  You should continue to take Hydrea (hydroxyurea) 1 tablet (500 mg) Monday through Friday.  FOLLOW-UP APPOINTMENT: Same-day labs and office visit in 2 months.  - - - - - - - - - - - - - - - - -  Thank you for choosing [place] at [facility] to provide your oncology and hematology care.  To afford each patient quality time with our provider, please arrive at least 15 minutes before your scheduled appointment time.   If you have a lab appointment with the [place] please come in thru the Main Entrance and check in at the main information desk.  You need to re-schedule your appointment should you arrive 10 or more minutes late.  We strive to give you quality time with our providers, and arriving late affects you and other patients whose appointments are after yours.  Also, if you no show three or more times for appointments you may be dismissed from the clinic at the providers discretion.     Again, thank you for choosing [facility].  Our hope is that these requests will decrease the amount of time that you wait before being seen by our physicians.       _____________________________________________________________  Should you have questions after your visit to [facility], please contact our office at [phone] and follow the prompts.  Our office hours are 8:00 a.m. and 4:30 p.m.  Monday - Friday.  Please note that voicemails left after 4:00 p.m. may not be returned until the following business day.  We are closed weekends and major holidays.  You do have access to a nurse 24-7, just call the main number to the clinic [phone] and do not press any options, hold on the line and a nurse will answer the phone.    For prescription refill requests, have your pharmacy contact our office and allow 72 hours.    Due to Covid, you will need to wear a mask upon entering the hospital. If you do not have a mask, a mask will be given to you at the Main Entrance upon arrival. For doctor visits, patients may have 1 support person age 42 or older with them. For treatment visits, patients can not have anyone with them due to social distancing guidelines and our immunocompromised population.

## 2021-08-30 DIAGNOSIS — Z853 Personal history of malignant neoplasm of breast: Secondary | ICD-10-CM | POA: Diagnosis not present

## 2021-08-30 DIAGNOSIS — R928 Other abnormal and inconclusive findings on diagnostic imaging of breast: Secondary | ICD-10-CM | POA: Diagnosis not present

## 2021-08-30 LAB — HM MAMMOGRAPHY

## 2021-09-02 ENCOUNTER — Other Ambulatory Visit: Payer: Self-pay | Admitting: Family Medicine

## 2021-09-03 ENCOUNTER — Encounter: Payer: Self-pay | Admitting: *Deleted

## 2021-09-09 ENCOUNTER — Ambulatory Visit: Payer: PPO | Admitting: Hematology and Oncology

## 2021-09-30 ENCOUNTER — Other Ambulatory Visit: Payer: Self-pay | Admitting: Physician Assistant

## 2021-09-30 ENCOUNTER — Other Ambulatory Visit: Payer: Self-pay | Admitting: Family Medicine

## 2021-09-30 DIAGNOSIS — D473 Essential (hemorrhagic) thrombocythemia: Secondary | ICD-10-CM

## 2021-09-30 DIAGNOSIS — Z1589 Genetic susceptibility to other disease: Secondary | ICD-10-CM

## 2021-10-12 ENCOUNTER — Inpatient Hospital Stay: Payer: PPO | Admitting: Physician Assistant

## 2021-10-12 ENCOUNTER — Inpatient Hospital Stay: Payer: PPO

## 2021-10-26 ENCOUNTER — Other Ambulatory Visit: Payer: Self-pay | Admitting: Family Medicine

## 2021-10-26 DIAGNOSIS — H43813 Vitreous degeneration, bilateral: Secondary | ICD-10-CM | POA: Diagnosis not present

## 2021-10-26 DIAGNOSIS — H353112 Nonexudative age-related macular degeneration, right eye, intermediate dry stage: Secondary | ICD-10-CM | POA: Diagnosis not present

## 2021-10-26 DIAGNOSIS — H04123 Dry eye syndrome of bilateral lacrimal glands: Secondary | ICD-10-CM | POA: Diagnosis not present

## 2021-10-26 DIAGNOSIS — H26492 Other secondary cataract, left eye: Secondary | ICD-10-CM | POA: Diagnosis not present

## 2021-10-26 DIAGNOSIS — H5213 Myopia, bilateral: Secondary | ICD-10-CM | POA: Diagnosis not present

## 2021-10-26 DIAGNOSIS — H524 Presbyopia: Secondary | ICD-10-CM | POA: Diagnosis not present

## 2021-11-15 NOTE — Progress Notes (Unsigned)
[place], [place] [address]   CLINIC:  Medical Oncology/Hematology  PCP:  Kathyrn Drown, MD [address] [place] [address] [phone]   REASON FOR VISIT:  Follow-up for JAK2 essential thrombocytosis  PRIOR THERAPY: None  CURRENT THERAPY: Hydrea 500 mg Monday through Friday   INTERVAL HISTORY:  Ms. Kubicki 81 y.o. female returns for routine follow-up of her recently diagnosed JAK2 essential thrombocytosis.  She was last seen by Tarri Abernethy PA-C on 08/03/2021.  At today's visit, she reports feeling well.  She has not had any changes in her symptoms or health status since her last visit.  She is taking Hydrea 500 mg Monday through Friday.  She is tolerating this well.  She denies any mouth ulcers, skin sores, or GI side effects.  Her energy levels are .  She reports that she has not noticed her aquagenic pruritus as much since starting Hydrea.  She is no longer having any reddish discoloration in her feet or fingers.  She continues to complain of right foot and right lower leg pain as well as bilateral leg cramps at night - Xray showed arthritis.  No Raynaud's phenomenon or other vasomotor symptoms such as dizziness, tinnitus, blurry vision, strokelike symptoms, or neuropathy. She has occasional night sweats.  She denies any unexplained fever, chills, or weight loss. No abdominal pain nausea, or early satiety.  She has 75% energy and 100% appetite. She endorses that she is maintaining a stable weight.   REVIEW OF SYSTEMS:     Review of Systems  Constitutional:  Positive for fatigue (mild, baseline). Negative for appetite change, chills, diaphoresis, fever and unexpected weight change.  HENT:   Negative for lump/mass and nosebleeds.   Eyes:  Negative for eye problems.  Respiratory:  Negative for cough, hemoptysis and shortness of breath.   Cardiovascular:  Negative for chest pain, leg swelling and palpitations.   Gastrointestinal:  Negative for abdominal pain, blood in stool, constipation, diarrhea, nausea and vomiting.  Genitourinary:  Negative for hematuria.   Musculoskeletal:  Positive for arthralgias (aching right foot and right leg "bone pain").  Skin:  Positive for itching (Mild aquagenic pruritus).  Neurological:  Positive for numbness (burning pain right foot). Negative for dizziness, headaches and light-headedness.  Hematological:  Does not bruise/bleed easily.      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    Osteopenia    Seizure ([place])    none since before 2000   Vertigo 08/2010   Past Surgical History:  Procedure Laterality Date   AXILLARY SENTINEL NODE BIOPSY Right 10/05/2018   Procedure: RIGHT AXILLARY SENTINEL LYMPH NODE BIOPSY;  Surgeon: Alphonsa Overall, MD;  Location: [facility];  Service: General;  Laterality: Right;   BREAST LUMPECTOMY WITH RADIOACTIVE SEED LOCALIZATION Right 09/21/2018   Procedure: RIGHT BREAST LUMPECTOMY WITH RADIOACTIVE SEED LOCALIZATION;  Surgeon: Alphonsa Overall, MD;  Location: [facility];  Service: General;  Laterality: Right;   COLONOSCOPY  TONSILLECTOMY     TUBAL LIGATION     TUBAL LIGATION       SOCIAL HISTORY:  Social History   Socioeconomic History   Marital status: Married    Spouse name: Jeneen Rinks   Number of children: 3   Years of education: Not on file   Highest education level: Not on file  Occupational History   Not on file  Tobacco Use   Smoking status: Never   Smokeless tobacco: Never  Vaping Use    Vaping Use: Never used  Substance and Sexual Activity   Alcohol use: No   Drug use: No   Sexual activity: Yes  Other Topics Concern   Not on file  Social History Narrative   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   Social Determinants of Health   Financial Resource Strain: Low Risk  (11/17/2020)   Overall Financial Resource Strain (CARDIA)    Difficulty of Paying Living Expenses: Not hard at all  Food Insecurity: No Food Insecurity (11/17/2020)   Hunger Vital Sign    Worried About Running Out of Food in the Last Year: Never true    Ran Out of Food in the Last Year: Never true  Transportation Needs: No Transportation Needs (11/17/2020)   PRAPARE - Hydrologist (Medical): No    Lack of Transportation (Non-Medical): No  Physical Activity: Sufficiently Active (11/17/2020)   Exercise Vital Sign    Days of Exercise per Week: 5 days    Minutes of Exercise per Session: 30 min  Stress: No Stress Concern Present (11/17/2020)   [place]    Feeling of Stress : Not at all  Social Connections: [place] (11/17/2020)   Social Connection and Isolation Panel [NHANES]    Frequency of Communication with Friends and Family: More than three times a week    Frequency of Social Gatherings with Friends and Family: More than three times a week    Attends Religious Services: More than 4 times per year    Active Member of [employer] or Organizations: Yes    Attends Music therapist: More than 4 times per year    Marital Status: Married  Human resources officer Violence: Not At Risk (11/17/2020)   Humiliation, Afraid, Rape, and Kick questionnaire    Fear of Current or Ex-Partner: No    Emotionally Abused: No    Physically Abused: No    Sexually Abused: No    FAMILY HISTORY:  Family History  Problem Relation Age of Onset   Crohn's disease Mother    Colon cancer  Maternal Uncle 22    CURRENT MEDICATIONS:  Outpatient Encounter Medications as of 11/16/2021  Medication Sig   albuterol (VENTOLIN HFA) 108 (90 Base) MCG/ACT inhaler Inhale 2 puffs into the lungs 2 (two) times daily as needed for wheezing or shortness of breath.   aspirin EC 81 MG tablet Take 81 mg by mouth at bedtime.   Calcium Carbonate-Vit D-Min (CALTRATE 600+D PLUS MINERALS) 600-800 MG-UNIT CHEW    cycloSPORINE (RESTASIS) 0.05 % ophthalmic emulsion Place 1 drop into both eyes 2 (two) times daily.   hydroxyurea (HYDREA) 500 MG capsule Take 500 mg (1 capsule) daily Monday through Friday.  May take with food to minimize GI side effects.   montelukast (SINGULAIR) 10 MG tablet TAKE ONE TABLET BY MOUTH ONCE DAILY.   Multiple Vitamins-Minerals (CENTRUM SILVER 50+WOMEN PO)    Multiple Vitamins-Minerals (PRESERVISION  AREDS 2 PO) Take by mouth 2 (two) times daily.   Omega-3 Fatty Acids (FISH OIL) 1000 MG CAPS Take 1,000 mg by mouth daily.   OVER THE COUNTER MEDICATION chlorimaletae 53m one every 4 hours prn allergies   triamcinolone (NASACORT) 55 MCG/ACT AERO nasal inhaler Place 2 sprays into the nose daily.   No facility-administered encounter medications on file as of 11/16/2021.    ALLERGIES:  No Known Allergies   PHYSICAL EXAM:    ECOG PERFORMANCE STATUS: 1 - Symptomatic but completely ambulatory    There were no vitals filed for this visit. There were no vitals filed for this visit. Physical Exam Constitutional:      Appearance: Normal appearance.  HENT:     Head: Normocephalic and atraumatic.     Mouth/Throat:     Mouth: Mucous membranes are moist.  Eyes:     Extraocular Movements: Extraocular movements intact.     Pupils: Pupils are equal, round, and reactive to light.  Cardiovascular:     Rate and Rhythm: Normal rate and regular rhythm.     Pulses: Normal pulses.     Heart sounds: Normal heart sounds.  Pulmonary:     Effort: Pulmonary effort is normal.     Breath  sounds: Normal breath sounds.  Abdominal:     General: Bowel sounds are normal.     Palpations: Abdomen is soft. There is no hepatomegaly or splenomegaly.     Tenderness: There is no abdominal tenderness.  Musculoskeletal:        General: No swelling.     Right lower leg: No edema.     Left lower leg: No edema.  Lymphadenopathy:     Cervical: No cervical adenopathy.  Skin:    General: Skin is warm and dry.     Comments: Previously noted faint dusky red erythema of fingers of bilateral hands has RESOLVED  Neurological:     General: No focal deficit present.     Mental Status: She is alert and oriented to person, place, and time.  Psychiatric:        Mood and Affect: Mood normal.        Behavior: Behavior normal.     LABORATORY DATA:  I have reviewed the labs as listed.  CBC    Component Value Date/Time   WBC 6.6 08/03/2021 1240   RBC 4.12 08/03/2021 1240   HGB 13.6 08/03/2021 1240   HGB 16.6 (H) 03/18/2021 0803   HCT 40.2 08/03/2021 1240   HCT 50.9 (H) 03/18/2021 0803   PLT 373 08/03/2021 1240   PLT 583 (H) 03/18/2021 0803   MCV 97.6 08/03/2021 1240   MCV 92 03/18/2021 0803   MCH 33.0 08/03/2021 1240   MCHC 33.8 08/03/2021 1240   RDW 16.1 (H) 08/03/2021 1240   RDW 13.9 03/18/2021 0803   LYMPHSABS 1.4 08/03/2021 1240   LYMPHSABS 2.0 03/18/2021 0803   MONOABS 0.4 08/03/2021 1240   EOSABS 0.2 08/03/2021 1240   EOSABS 0.3 03/18/2021 0803   BASOSABS 0.1 08/03/2021 1240   BASOSABS 0.1 03/18/2021 0803      Latest Ref Rng & Units 08/03/2021   12:40 PM 06/24/2021   10:09 AM 03/18/2021    8:03 AM  CMP  Glucose 70 - 99 mg/dL 128  104  95   BUN 8 - 23 mg/dL _0 Creatinine 0.44 - 1.00 mg/dL 0.65  0.78  0.83   Sodium 135 - 145 mmol/L 140  140  143   Potassium 3.5 - 5.1 mmol/L 3.9  4.1  5.1   Chloride 98 - 111 mmol/L 111  105  103   CO2 22 - 32 mmol/L _0 Calcium 8.9 - 10.3 mg/dL 8.8  9.1  10.1   Total Protein 6.5 - 8.1 g/dL 6.5  6.6  7.0   Total  Bilirubin 0.3 - 1.2 mg/dL 0.6  0.8  0.4   Alkaline Phos 38 - 126 U/L 66  69  95   AST 15 - 41 U/L _1 ALT 0 - 44 U/L _2 DIAGNOSTIC IMAGING:  I have independently reviewed the relevant imaging and discussed with the patient.  ASSESSMENT & PLAN: 1.  JAK2 V617F + essential thrombocytosis versus polycythemia vera - Seen at the request of her primary care provider (NP Pearson Forster) for evaluation of elevated platelets and elevated hemoglobin. - Review of past CBCs shows history of mild thrombocytosis since at least 2012 with platelets 404.  Platelets appear to have been increasing over the past few years, with most recent CBC (03/18/2021) showing platelets 583.  CBC (03/18/2021) also showed new erythrocytosis with Hgb 16.6/HCT 50.9. - Lifelong non-smoker.  No history of autoimmune disease, connective tissue disorder, sleep apnea.  No diuretic medications. 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 - Bone marrow biopsy (07/23/2021): Hypercellular bone marrow with pan myeloid proliferation.  No significant increase in reticulin fibers.  Cytogenetics normal with 46,XX[5] - She only has one isolated episode of elevated Hgb/HCT, unclear at this time if clinical picture represents essential thrombocytosis or polycythemia vera - Symptomatic with occasional aquagenic pruritus (lasting 5 to 10 minutes), and reddish discoloration of bilateral feet with tingling pain in her right foot.  She has some faint dusky red erythema of her bilateral fingers.  No B symptoms.    - She has right leg and foot pain, with x-ray (06/24/2021) showing moderate to severe osteoarthritis with osteophytes. - She is taking aspirin 81 mg daily      - Started Hydrea on 05/27/2021.  Current dose is hydroxyurea 500 mg Monday through Friday.  She is  tolerating this well.     - Goal is to keep platelets <400 and HCT <45.0 to decrease risk of VTE.  However, if she is intolerant of Hydrea, could opt for conservative management with aspirin 81 mg alone as long as platelets are less than 500. - We have discussed pathophysiology of her JAK2+ MPN.  Given possible involvement of hemoglobin, this may represent polycythemia vera rather than essential thrombocytosis.  We discussed that main risk of her condition is risk of DVT, PE, MI, and CVA.  We discussed that there is also some inherent risk of progression to leukemia or myelofibrosis. - Labs today (11/16/2021) are at goal with platelets 306, Hgb 13.8/HCT 40.3, WBC 5.5, normal differential.  Normal CMP and LDH. - PLAN: Platelets and hematocrit at goal.  We will continue Hydrea 500 mg Monday through Friday. - Continue aspirin 81 mg daily - Repeat labs and RTC in 4 months   2.  History of right-sided stage Ia breast cancer - History of stage Ia right-sided breast cancer (ER/PR negative, HER2 positive), followed by Dr. Nicholas L[facility]. -  Right breast lumpectomy on 09/20/2018, followed by radiation therapy.  No role of adjuvant systemic chemotherapy or antiestrogen therapy because of the size of the tumor and the fact that it was estrogen receptor negative. - She continues to follow with Dr. Lindi Adie for annual survivorship visits and checkups. - She is up-to-date on her mammograms, most recently performed on 08/30/2021, BI-RADS Category 2, benign.   3.  Other history - PMH:  Stage Ia breast cancer, osteoarthritis, asthma, seizures, vertigo, and osteopenia. - SOCIAL: Patient lives at home with her husband.  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  She denies any history of tobacco, alcohol, or illicit drug use. - FAMILY:  Denies any family history of blood disorders or myeloproliferative neoplasms.  She reports that she had several maternal uncles with  unspecified types of cancer.  Maternal aunt with breast cancer.   All questions were answered. The patient knows to call the clinic with any problems, questions or concerns.  Medical decision making: Moderate  Time spent on visit: I spent 15 minutes counseling the patient face to face. The total time spent in the appointment was 22 minutes and more than 50% was on counseling.   Harriett Rush, PA-C  11/16/2021 10:34 AM

## 2021-11-16 ENCOUNTER — Inpatient Hospital Stay: Payer: PPO | Attending: Physician Assistant | Admitting: Physician Assistant

## 2021-11-16 ENCOUNTER — Inpatient Hospital Stay: Payer: PPO

## 2021-11-16 VITALS — BP 128/71 | HR 76 | Temp 98.1°F | Resp 16 | Ht 63.0 in | Wt 131.3 lb

## 2021-11-16 DIAGNOSIS — D473 Essential (hemorrhagic) thrombocythemia: Secondary | ICD-10-CM | POA: Diagnosis not present

## 2021-11-16 DIAGNOSIS — D471 Chronic myeloproliferative disease: Secondary | ICD-10-CM

## 2021-11-16 DIAGNOSIS — Z1589 Genetic susceptibility to other disease: Secondary | ICD-10-CM

## 2021-11-16 DIAGNOSIS — D75839 Thrombocytosis, unspecified: Secondary | ICD-10-CM | POA: Insufficient documentation

## 2021-11-16 LAB — CBC WITH DIFFERENTIAL/PLATELET
Abs Immature Granulocytes: 0.01 10*3/uL (ref 0.00–0.07)
Basophils Absolute: 0.1 10*3/uL (ref 0.0–0.1)
Basophils Relative: 2 %
Eosinophils Absolute: 0.1 10*3/uL (ref 0.0–0.5)
Eosinophils Relative: 2 %
HCT: 40.3 % (ref 36.0–46.0)
Hemoglobin: 13.8 g/dL (ref 12.0–15.0)
Immature Granulocytes: 0 %
Lymphocytes Relative: 23 %
Lymphs Abs: 1.3 10*3/uL (ref 0.7–4.0)
MCH: 35 pg — ABNORMAL HIGH (ref 26.0–34.0)
MCHC: 34.2 g/dL (ref 30.0–36.0)
MCV: 102.3 fL — ABNORMAL HIGH (ref 80.0–100.0)
Monocytes Absolute: 0.3 10*3/uL (ref 0.1–1.0)
Monocytes Relative: 6 %
Neutro Abs: 3.7 10*3/uL (ref 1.7–7.7)
Neutrophils Relative %: 67 %
Platelets: 306 10*3/uL (ref 150–400)
RBC: 3.94 MIL/uL (ref 3.87–5.11)
RDW: 14 % (ref 11.5–15.5)
WBC: 5.5 10*3/uL (ref 4.0–10.5)
nRBC: 0 % (ref 0.0–0.2)

## 2021-11-16 LAB — COMPREHENSIVE METABOLIC PANEL
ALT: 20 U/L (ref 0–44)
AST: 20 U/L (ref 15–41)
Albumin: 4 g/dL (ref 3.5–5.0)
Alkaline Phosphatase: 62 U/L (ref 38–126)
Anion gap: 6 (ref 5–15)
BUN: 15 mg/dL (ref 8–23)
CO2: 25 mmol/L (ref 22–32)
Calcium: 9 mg/dL (ref 8.9–10.3)
Chloride: 107 mmol/L (ref 98–111)
Creatinine, Ser: 0.76 mg/dL (ref 0.44–1.00)
GFR, Estimated: >60 mL/min (ref >60)
Glucose, Bld: 96 mg/dL (ref 70–99)
Potassium: 3.9 mmol/L (ref 3.5–5.1)
Sodium: 138 mmol/L (ref 135–145)
Total Bilirubin: 0.8 mg/dL (ref 0.3–1.2)
Total Protein: 6.4 g/dL — ABNORMAL LOW (ref 6.5–8.1)

## 2021-11-16 LAB — LACTATE DEHYDROGENASE: LDH: 145 U/L (ref 98–192)

## 2021-11-16 NOTE — Patient Instructions (Signed)
[name] at [facility] Discharge Instructions  You were seen today by Tarri Abernethy PA-C for your follow-up visit to discuss your polycythemia.  Your blood counts look great today!  You should continue to take Hydrea (hydroxyurea) 1 tablet (500 mg) Monday through Friday.  FOLLOW-UP APPOINTMENT: Same-day labs and office visit in 4 months.  However, if you have any changes or concerns before that time, do not hesitate to reach out and request an earlier appointment if needed.  - - - - - - - - - - - - - - - - -  Thank you for choosing [place] at [facility] to provide your oncology and hematology care.  To afford each patient quality time with our provider, please arrive at least 15 minutes before your scheduled appointment time.   If you have a lab appointment with the [place] please come in thru the Main Entrance and check in at the main information desk.  You need to re-schedule your appointment should you arrive 10 or more minutes late.  We strive to give you quality time with our providers, and arriving late affects you and other patients whose appointments are after yours.  Also, if you no show three or more times for appointments you may be dismissed from the clinic at the providers discretion.     Again, thank you for choosing [facility].  Our hope is that these requests will decrease the amount of time that you wait before being seen by our physicians.       _____________________________________________________________  Should you have questions after your visit to [facility], please contact our office at [phone] and follow the prompts.  Our office hours are 8:00 a.m. and 4:30 p.m. Monday - Friday.  Please note that voicemails left after 4:00 p.m. may not be returned until the following business day.  We are closed weekends and major holidays.  You do have access to a nurse 24-7, just call the  main number to the clinic [phone] and do not press any options, hold on the line and a nurse will answer the phone.    For prescription refill requests, have your pharmacy contact our office and allow 72 hours.    Due to Covid, you will need to wear a mask upon entering the hospital. If you do not have a mask, a mask will be given to you at the Main Entrance upon arrival. For doctor visits, patients may have 1 support person age 45 or older with them. For treatment visits, patients can not have anyone with them due to social distancing guidelines and our immunocompromised population.

## 2021-11-22 ENCOUNTER — Other Ambulatory Visit: Payer: Self-pay | Admitting: *Deleted

## 2021-11-22 DIAGNOSIS — Z1589 Genetic susceptibility to other disease: Secondary | ICD-10-CM

## 2021-11-22 DIAGNOSIS — D473 Essential (hemorrhagic) thrombocythemia: Secondary | ICD-10-CM

## 2021-11-22 MED ORDER — HYDROXYUREA 500 MG PO CAPS: ORAL_CAPSULE | ORAL | 3 refills | Status: DC

## 2021-12-24 ENCOUNTER — Ambulatory Visit
Admission: EM | Admit: 2021-12-24 | Discharge: 2021-12-24 | Disposition: A | Discharge status: Home / Self Care | Payer: PPO | Attending: Family Medicine | Admitting: Family Medicine

## 2021-12-24 ENCOUNTER — Other Ambulatory Visit: Payer: Self-pay

## 2021-12-24 ENCOUNTER — Encounter: Payer: Self-pay | Admitting: Emergency Medicine

## 2021-12-24 DIAGNOSIS — M109 Gout, unspecified: Secondary | ICD-10-CM

## 2021-12-24 MED ORDER — DEXAMETHASONE SODIUM PHOSPHATE 10 MG/ML IJ SOLN
10.0000 mg | Freq: Once | INTRAMUSCULAR | Status: AC
  Administered 2021-12-24: 10 mg via INTRAMUSCULAR

## 2021-12-24 NOTE — ED Triage Notes (Signed)
Pt reports right foot pain/right great toe pain since last night. Denies any known injury.pt reports sensitivity to touch and swelling

## 2021-12-24 NOTE — ED Provider Notes (Signed)
[facility]    CSN: [id number] Arrival date & time: 12/24/21  0802      History   Chief Complaint Chief Complaint  Patient presents with   Foot Pain    HPI Yvonne Maynard is a 81 y.o. female.   Patient presenting today with sudden onset right great toe pain, swelling, redness that started last night.  She denies any known injury to the area, fever, chills, loss of range of motion.  Tried some Tylenol with minimal relief of symptoms.  Does have a history of arthritis in this foot but never anything like this.    Past Medical History:  Diagnosis Date   Aortic atherosclerosis ([place]) 06/07/2020   Seen on cervical xray 06/07/20   Asthma    None in years   Cancer [facility])    skin cancer nose    Cerebrovascular disease    Diverticulosis of colon 08/19/2010   Per CT scan   DJD (degenerative joint disease) of cervical spine    Generalized convulsive seizures ([place]) 30 YEARS AGO   Remotely, following head injury from [place] degeneration    Osteopenia    Seizure ([place])    none since before 2000   Vertigo 08/2010    Patient Active Problem List   Diagnosis Date Noted   Myeloproliferative disease ([place]) 06/10/2021   Abnormal CBC 04/17/2021   Elevated platelet count 04/17/2021   Sun-damaged skin 04/17/2021   Gastroesophageal reflux disease without esophagitis 01/15/2021   History of neoplasm 11/16/2020   Seborrheic keratosis 11/16/2020   Aortic atherosclerosis ([place]) 06/07/2020   Nontraumatic neck pain 06/05/2020   History of breast cancer in female 06/05/2020   Dysphonia 10/08/2019   Vasomotor rhinitis 10/08/2019   Ductal carcinoma in situ (DCIS) of right breast 08/29/2018   Malignant neoplasm of upper-outer quadrant of right female breast ([place]) 08/15/2018   Impaired fasting glucose 11/18/2014   Macular degeneration of right eye 02/05/2014   Dizziness and giddiness 08/19/2010   Hypokalemia 08/19/2010   Diverticulosis of colon 08/19/2010   DJD  (degenerative joint disease) of cervical spine 08/19/2010   Cerebral microvascular disease 08/19/2010   Osteopenia     Past Surgical History:  Procedure Laterality Date   AXILLARY SENTINEL NODE BIOPSY Right 10/05/2018   Procedure: RIGHT AXILLARY SENTINEL LYMPH NODE BIOPSY;  Surgeon: Alphonsa Overall, MD;  Location: [place];  Service: General;  Laterality: Right;   BREAST LUMPECTOMY WITH RADIOACTIVE SEED LOCALIZATION Right 09/21/2018   Procedure: RIGHT BREAST LUMPECTOMY WITH RADIOACTIVE SEED LOCALIZATION;  Surgeon: Alphonsa Overall, MD;  Location: [place];  Service: General;  Laterality: Right;   COLONOSCOPY     TONSILLECTOMY     TUBAL LIGATION     TUBAL LIGATION      OB History   No obstetric history on file.      Home Medications    Prior to Admission medications   Medication Sig Start Date End Date Taking? Authorizing Provider  albuterol (VENTOLIN HFA) 108 (90 Base) MCG/ACT inhaler Inhale 2 puffs into the lungs 2 (two) times daily as needed for wheezing or shortness of breath. 08/12/19   Elvia Collum M, DO  aspirin EC 81 MG tablet Take 81 mg by mouth at bedtime.    [provider]  Calcium Carbonate-Vit D-Min (CALTRATE 600+D PLUS MINERALS) 600-800 MG-UNIT CHEW     [provider]  cycloSPORINE (RESTASIS) 0.05 % ophthalmic emulsion Place 1 drop into both eyes 2 (two) times daily. 09/03/20  Nicholas Lose, MD  hydroxyurea (HYDREA) 500 MG capsule Take 500 mg (1 capsule) daily Monday through Friday.  May take with food to minimize GI side effects. 11/22/21   Harriett Rush, PA-C  montelukast (SINGULAIR) 10 MG tablet TAKE ONE TABLET BY MOUTH ONCE DAILY. 10/27/21   Kathyrn Drown, MD  Multiple Vitamins-Minerals (CENTRUM SILVER 50+WOMEN PO)     [provider]  Multiple Vitamins-Minerals (PRESERVISION AREDS 2 PO) Take by mouth 2 (two) times daily.    [provider]  Omega-3 Fatty Acids (FISH OIL) 1000 MG CAPS Take 1,000 mg by mouth  daily.    [provider]  OVER THE COUNTER MEDICATION chlorimaletae '4mg'$  one every 4 hours prn allergies    [provider]  triamcinolone (NASACORT) 55 MCG/ACT AERO nasal inhaler Place 2 sprays into the nose daily.    [provider]    Family History Family History  Problem Relation Age of Onset   Crohn's disease Mother    Colon cancer Maternal Uncle 28    Social History Social History   Tobacco Use   Smoking status: Never   Smokeless tobacco: Never  Vaping Use   Vaping Use: Never used  Substance Use Topics   Alcohol use: No   Drug use: No     Allergies   Patient has no known allergies.   Review of Systems Review of Systems HPI  Physical Exam Triage Vital Signs ED Triage Vitals  Enc Vitals Group     BP 12/24/21 0827 138/71     Pulse Rate 12/24/21 0827 98     Resp 12/24/21 0827 20     Temp 12/24/21 0827 98.6 F (37 C)     Temp Source 12/24/21 0827 Oral     SpO2 12/24/21 0827 95 %     Weight --      Height --      Head Circumference --      Peak Flow --      Pain Score 12/24/21 0828 8     Pain Loc --      Pain Edu? --      Excl. in [place]? --    No data found.  Updated Vital Signs BP 138/71 (BP Location: Right Arm)   Pulse 98   Temp 98.6 F (37 C) (Oral)   Resp 20   SpO2 95%   Visual Acuity Right Eye Distance:   Left Eye Distance:   Bilateral Distance:    Right Eye Near:   Left Eye Near:    Bilateral Near:     Physical Exam Vitals and nursing note reviewed.  Constitutional:      Appearance: Normal appearance. She is not ill-appearing.  HENT:     Head: Atraumatic.  Eyes:     Extraocular Movements: Extraocular movements intact.     Conjunctiva/sclera: Conjunctivae normal.  Cardiovascular:     Rate and Rhythm: Normal rate and regular rhythm.     Heart sounds: Normal heart sounds.  Pulmonary:     Effort: Pulmonary effort is normal.     Breath sounds: Normal breath sounds.  Musculoskeletal:        General:  Swelling and tenderness present. No deformity or signs of injury. Normal range of motion.     Cervical back: Normal range of motion and neck supple.     Comments: Range of motion of the right foot intact but painful, weightbearing significantly painful.  Tenderness to palpation even to light touch base of  right great toe in area of erythema, edema  Skin:    General: Skin is warm and dry.     Findings: Erythema present.  Neurological:     Mental Status: She is alert and oriented to person, place, and time.     Comments: Right foot neurovascularly intact  Psychiatric:        Mood and Affect: Mood normal.        Thought Content: Thought content normal.        Judgment: Judgment normal.      UC Treatments / Results  Labs (all labs ordered are listed, but only abnormal results are displayed) Labs Reviewed - No data to display  EKG   Radiology No results found.  Procedures Procedures (including critical care time)  Medications Ordered in UC Medications  dexamethasone (DECADRON) injection 10 mg (10 mg Intramuscular Given 12/24/21 0853)    Initial Impression / Assessment and Plan / UC Course  I have reviewed the triage vital signs and the nursing notes.  Pertinent labs & imaging results that were available during my care of the patient were reviewed by me and considered in my medical decision making (see chart for details).     Consistent with gout, treat with IM Decadron, over-the-counter pain relievers as needed, Epsom salt soaks.  Also discussed tart cherry supplements, dietary changes  Final Clinical Impressions(s) / UC Diagnoses   Final diagnoses:  Acute gout of right foot, unspecified cause     Discharge Instructions      You may take tart cherry juice or capsules as a natural remedy to help with uric acid buildup.  Anti-inflammatory such as ibuprofen, Aleve, Advil are also very helpful when he began having pain.  We have given you a steroid shot today to help  resolve your current flareup.  Follow-up with primary care when able    ED Prescriptions   None    PDMP not reviewed this encounter.   Volney American, [place] 12/24/21 1929

## 2021-12-24 NOTE — Discharge Instructions (Signed)
You may take tart cherry juice or capsules as a natural remedy to help with uric acid buildup.  Anti-inflammatory such as ibuprofen, Aleve, Advil are also very helpful when he began having pain.  We have given you a steroid shot today to help resolve your current flareup.  Follow-up with primary care when able

## 2021-12-29 ENCOUNTER — Ambulatory Visit (INDEPENDENT_AMBULATORY_CARE_PROVIDER_SITE_OTHER): Payer: PPO

## 2021-12-29 ENCOUNTER — Ambulatory Visit
Admission: EM | Admit: 2021-12-29 | Discharge: 2021-12-29 | Disposition: A | Discharge status: Home / Self Care | Payer: PPO | Attending: Nurse Practitioner | Admitting: Nurse Practitioner

## 2021-12-29 DIAGNOSIS — M79644 Pain in right finger(s): Secondary | ICD-10-CM | POA: Diagnosis not present

## 2021-12-29 DIAGNOSIS — T1490XA Injury, unspecified, initial encounter: Secondary | ICD-10-CM | POA: Diagnosis not present

## 2021-12-29 DIAGNOSIS — S6701XA Crushing injury of right thumb, initial encounter: Secondary | ICD-10-CM | POA: Diagnosis not present

## 2021-12-29 NOTE — ED Provider Notes (Signed)
[facility]    CSN: [id number] Arrival date & time: 12/29/21  1714      History   Chief Complaint Chief Complaint  Patient presents with   Finger Injury    HPI Yvonne Maynard is a 81 y.o. female.   The history is provided by the patient.   The patient presents with an injury to the right thumb after she slammed the right thumb in a car door earlier.  Patient states that she is not sure how it happened, and that her car is an older car.  The right thumb is with bruising, tenderness, and disruption of the skin to the wound the right thumb.  Patient denies numbness, tingling, radiation of pain.  She reports that she has full motion and movement of the right thumb.  She states that she currently takes a baby aspirin daily.  Bleeding is controlled at this time.  Review of patient's chart shows TD administration on 08/28/2018.  Past Medical History:  Diagnosis Date   Aortic atherosclerosis ([place]) 06/07/2020   Seen on cervical xray 06/07/20   Asthma    None in years   Cancer [facility])    skin cancer nose    Cerebrovascular disease    Diverticulosis of colon 08/19/2010   Per CT scan   DJD (degenerative joint disease) of cervical spine    Generalized convulsive seizures ([place]) 30 YEARS AGO   Remotely, following head injury from [place] degeneration    Osteopenia    Seizure ([place])    none since before 2000   Vertigo 08/2010    Patient Active Problem List   Diagnosis Date Noted   Myeloproliferative disease ([place]) 06/10/2021   Abnormal CBC 04/17/2021   Elevated platelet count 04/17/2021   Sun-damaged skin 04/17/2021   Gastroesophageal reflux disease without esophagitis 01/15/2021   History of neoplasm 11/16/2020   Seborrheic keratosis 11/16/2020   Aortic atherosclerosis ([place]) 06/07/2020   Nontraumatic neck pain 06/05/2020   History of breast cancer in female 06/05/2020   Dysphonia 10/08/2019   Vasomotor rhinitis 10/08/2019   Ductal carcinoma in situ (DCIS) of  right breast 08/29/2018   Malignant neoplasm of upper-outer quadrant of right female breast ([place]) 08/15/2018   Impaired fasting glucose 11/18/2014   Macular degeneration of right eye 02/05/2014   Dizziness and giddiness 08/19/2010   Hypokalemia 08/19/2010   Diverticulosis of colon 08/19/2010   DJD (degenerative joint disease) of cervical spine 08/19/2010   Cerebral microvascular disease 08/19/2010   Osteopenia     Past Surgical History:  Procedure Laterality Date   AXILLARY SENTINEL NODE BIOPSY Right 10/05/2018   Procedure: RIGHT AXILLARY SENTINEL LYMPH NODE BIOPSY;  Surgeon: Alphonsa Overall, MD;  Location: [place];  Service: General;  Laterality: Right;   BREAST LUMPECTOMY WITH RADIOACTIVE SEED LOCALIZATION Right 09/21/2018   Procedure: RIGHT BREAST LUMPECTOMY WITH RADIOACTIVE SEED LOCALIZATION;  Surgeon: Alphonsa Overall, MD;  Location: [place];  Service: General;  Laterality: Right;   COLONOSCOPY     TONSILLECTOMY     TUBAL LIGATION     TUBAL LIGATION      OB History   No obstetric history on file.      Home Medications    Prior to Admission medications   Medication Sig Start Date End Date Taking? Authorizing Provider  albuterol (VENTOLIN HFA) 108 (90 Base) MCG/ACT inhaler Inhale 2 puffs into the lungs 2 (two) times daily as needed for wheezing or shortness of breath. 08/12/19  Lovena Le, Malena M, DO  aspirin EC 81 MG tablet Take 81 mg by mouth at bedtime.    [provider]  Calcium Carbonate-Vit D-Min (CALTRATE 600+D PLUS MINERALS) 600-800 MG-UNIT CHEW     [provider]  cycloSPORINE (RESTASIS) 0.05 % ophthalmic emulsion Place 1 drop into both eyes 2 (two) times daily. 09/03/20   Nicholas Lose, MD  hydroxyurea (HYDREA) 500 MG capsule Take 500 mg (1 capsule) daily Monday through Friday.  May take with food to minimize GI side effects. 11/22/21   Harriett Rush, PA-C  montelukast (SINGULAIR) 10 MG tablet TAKE ONE TABLET BY MOUTH ONCE DAILY.  10/27/21   Kathyrn Drown, MD  Multiple Vitamins-Minerals (CENTRUM SILVER 50+WOMEN PO)     [provider]  Multiple Vitamins-Minerals (PRESERVISION AREDS 2 PO) Take by mouth 2 (two) times daily.    [provider]  Omega-3 Fatty Acids (FISH OIL) 1000 MG CAPS Take 1,000 mg by mouth daily.    [provider]  OVER THE COUNTER MEDICATION chlorimaletae '4mg'$  one every 4 hours prn allergies    [provider]  triamcinolone (NASACORT) 55 MCG/ACT AERO nasal inhaler Place 2 sprays into the nose daily.    [provider]    Family History Family History  Problem Relation Age of Onset   Crohn's disease Mother    Colon cancer Maternal Uncle 40    Social History Social History   Tobacco Use   Smoking status: Never   Smokeless tobacco: Never  Vaping Use   Vaping Use: Never used  Substance Use Topics   Alcohol use: No   Drug use: No     Allergies   Patient has no known allergies.   Review of Systems Review of Systems Per HPI  Physical Exam Triage Vital Signs ED Triage Vitals  Enc Vitals Group     BP 12/29/21 1822 (!) 150/71     Pulse Rate 12/29/21 1822 74     Resp 12/29/21 1822 17     Temp 12/29/21 1822 98.3 F (36.8 C)     Temp Source 12/29/21 1822 Oral     SpO2 12/29/21 1822 97 %     Weight --      Height --      Head Circumference --      Peak Flow --      Pain Score 12/29/21 1827 4     Pain Loc --      Pain Edu? --      Excl. in [place]? --    No data found.  Updated Vital Signs BP (!) 150/71 (BP Location: Right Arm)   Pulse 74   Temp 98.3 F (36.8 C) (Oral)   Resp 17   SpO2 97%   Visual Acuity Right Eye Distance:   Left Eye Distance:   Bilateral Distance:    Right Eye Near:   Left Eye Near:    Bilateral Near:     Physical Exam Vitals and nursing note reviewed.  Constitutional:      Appearance: Normal appearance.  Musculoskeletal:     Right hand: Swelling and tenderness present. Normal range of motion.  Normal strength. Normal capillary refill. Normal pulse.     Comments: Ecchymosis noted to the right thumb to the lateral aspect and at the base of the nailbed.  Right thumb with tenderness, but patient has full range of motion.  No obvious deformity noted.  Site was cleaned with Hibiclens soap and normal saline.  Skin  avulsion noted to the base of the right thumb around the lateral aspect of the nailbed.  Unable to provide suturing.  There is bruising noted to the lateral aspect of the right thumb.  Skin:    General: Skin is warm and dry.  Neurological:     General: No focal deficit present.     Mental Status: She is alert and oriented to person, place, and time.  Psychiatric:        Mood and Affect: Mood normal.        Behavior: Behavior normal.      UC Treatments / Results  Labs (all labs ordered are listed, but only abnormal results are displayed) Labs Reviewed - No data to display  EKG   Radiology DG Finger Thumb Right  Result Date: 12/29/2021 CLINICAL DATA:  Trauma EXAM: RIGHT THUMB 2+V COMPARISON:  None Available. FINDINGS: No recent fracture or dislocation is seen. There is 2 mm smooth marginated calcification adjacent to the interphalangeal joint suggesting old avulsion or ligament calcification from previous injury. Degenerative changes are noted with bony spurs in first carpometacarpal joint, first metacarpophalangeal joint and the interphalangeal joint. IMPRESSION: No recent fracture or dislocation is seen in right thumb. 2 mm smooth marginated calcification adjacent to the interphalangeal joint may be residual from previous injury. Degenerative changes are noted in multiple joints. Electronically Signed   By: Elmer Picker M.D.   On: 12/29/2021 18:40    Procedures Procedures (including critical care time)  Medications Ordered in UC Medications - No data to display  Initial Impression / Assessment and Plan / UC Course  I have reviewed the triage vital signs and  the nursing notes.  Pertinent labs & imaging results that were available during my care of the patient were reviewed by me and considered in my medical decision making (see chart for details).  The patient is well-appearing, she is in no acute distress, vital signs are stable.  Patient with crush injury of the right thumb.  Symptoms are consistent with bruising.  X-ray showed no fracture or dislocation noted.  Wound care was provided to the patient while in the clinic, along with wound care instructions also discussed with the patient.  Supportive care recommendations were provided to the patient to include the use of Tylenol as needed for pain or discomfort.  Patient was also instructed to apply ice to help with pain and swelling.  Patient was advised that if symptoms do not improve over the next 1 to 2 weeks, to follow-up with her primary care physician or with orthopedics.  Patient was given information for [facility] and for [facility].  Patient verbalizes understanding.  All questions were answered.  Patient stable for discharge. Final Clinical Impressions(s) / UC Diagnoses   Final diagnoses:  Crushing injury of right thumb, initial encounter     Discharge Instructions      There is no new fracture or dislocation noted to the right thumb. As discussed, continue wound care instructions as discussed.  Cleanse the area 1-2 times daily with an antibacterial soap such as Dial Gold bar soap, and apply a dressing to keep the area protected. May take over-the-counter Tylenol as needed for pain or discomfort. Recommend the use of ice to help with pain and swelling.  Apply for 20 minutes, remove for 1 hour, then repeat as much as possible. If symptoms do not improve over the next 1 to 2 weeks, please follow-up with your primary care physician or with orthopedics.  You can follow-up with [facility] [place] [facility] at [phone] or with [facility] at [phone]. Follow-up as  needed.     ED Prescriptions   None    PDMP not reviewed this encounter.   Tish Men, NP 12/29/21 1848

## 2021-12-29 NOTE — Discharge Instructions (Addendum)
There is no new fracture or dislocation noted to the right thumb. As discussed, continue wound care instructions as discussed.  Cleanse the area 1-2 times daily with an antibacterial soap such as Dial Gold bar soap, and apply a dressing to keep the area protected. May take over-the-counter Tylenol as needed for pain or discomfort. Recommend the use of ice to help with pain and swelling.  Apply for 20 minutes, remove for 1 hour, then repeat as much as possible. If symptoms do not improve over the next 1 to 2 weeks, please follow-up with your primary care physician or with orthopedics.  You can follow-up with [facility] [place] at [phone] or with [facility] at [phone]. Follow-up as needed.

## 2021-12-29 NOTE — ED Triage Notes (Signed)
Pt reports her right thumb got stuck with the door car today.

## 2022-01-05 ENCOUNTER — Ambulatory Visit (INDEPENDENT_AMBULATORY_CARE_PROVIDER_SITE_OTHER): Payer: PPO | Admitting: Family Medicine

## 2022-01-05 VITALS — BP 128/64 | HR 84 | Temp 97.7°F | Ht 63.0 in | Wt 133.0 lb

## 2022-01-05 DIAGNOSIS — R0981 Nasal congestion: Secondary | ICD-10-CM | POA: Diagnosis not present

## 2022-01-05 DIAGNOSIS — S61019D Laceration without foreign body of unspecified thumb without damage to nail, subsequent encounter: Secondary | ICD-10-CM | POA: Diagnosis not present

## 2022-01-05 MED ORDER — MUPIROCIN 2 % EX OINT: TOPICAL_OINTMENT | CUTANEOUS | 0 refills | Status: DC

## 2022-01-05 MED ORDER — AZELASTINE HCL 0.1 % NA SOLN: 2.0000 | Freq: Two times a day (BID) | NASAL | 12 refills | Status: DC

## 2022-01-05 NOTE — Progress Notes (Signed)
   Subjective:    Patient ID: Clotilde Dieter, female    DOB: 1940-04-22, 81 y.o.   MRN: [id number]  HPI  Follow up for right thumb injury , went to urgent care 1 week ago Had x rays done, using neosporin and band aid Patient unfortunately shot her finger in the door because of pain discomfort bleeding x-ray was negative still have tenderness and pain she wanted to get it checked out to make sure there was not another issue going on  She also relates head congestion drainage and some level of nasal stuffiness has been going on for months at times posterior head pain no vomiting with it.  No shortness of breath or fevers Review of Systems     Objective:   Physical Exam Eardrums normal sinuses nontender throat normal neck no masses lungs clear no crackles heart regular thumb wound inspected has a laceration that is healing with tenderness but no obvious infection       Assessment & Plan:  Thumb wound Should gradually get better Bactroban thin amount twice daily Warning signs discussed regarding cellulitis follow-up if problems  Chronic head congestion try Astelin nasal spray.  If progressive troubles or worse follow-up  Posterior neck pain that is intermittent unlikely to be meningitis more likely to be arthritis follow-up if ongoing troubles

## 2022-01-14 ENCOUNTER — Telehealth: Payer: Self-pay | Admitting: Family Medicine

## 2022-01-14 ENCOUNTER — Ambulatory Visit
Admission: EM | Admit: 2022-01-14 | Discharge: 2022-01-14 | Disposition: A | Discharge status: Home / Self Care | Payer: PPO | Attending: Family Medicine | Admitting: Family Medicine

## 2022-01-14 ENCOUNTER — Encounter: Payer: Self-pay | Admitting: Emergency Medicine

## 2022-01-14 DIAGNOSIS — U071 COVID-19: Secondary | ICD-10-CM | POA: Diagnosis not present

## 2022-01-14 MED ORDER — MOLNUPIRAVIR EUA 200MG CAPSULE: 4.0000 | ORAL_CAPSULE | Freq: Two times a day (BID) | ORAL | 0 refills | Status: AC

## 2022-01-14 MED ORDER — PROMETHAZINE-DM 6.25-15 MG/5ML PO SYRP: 5.0000 mL | ORAL_SOLUTION | Freq: Four times a day (QID) | ORAL | 0 refills | Status: DC | PRN

## 2022-01-14 NOTE — Telephone Encounter (Signed)
Patient is going to decide if she is going to urgent care or to the store to take a test.

## 2022-01-14 NOTE — ED Triage Notes (Signed)
Fever, cough, body aches, back pain, fatigue since last night.  Husband diagnosed with covid yesterday.  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

## 2022-01-14 NOTE — Telephone Encounter (Signed)
Patient is requesting something for Covid symptoms headache,cough,bodyache but hasnt'  tested for Covid her husband went to urgent care yesterday and his test was positive. [place]

## 2022-01-18 NOTE — ED Provider Notes (Signed)
[facility]    CSN: [id number] Arrival date & time: 01/14/22  1708      History   Chief Complaint Chief Complaint  Patient presents with   Fever   Cough    HPI Yvonne Maynard is a 82 y.o. female.   Presenting today with 1 day history of fever, body aches, chills, fatigue, cough, congestion. Denies CP, SOB, abdominal pain, N/V/D. States husband tested positive for COVID yesterday. So far not trying anything OTC for sxs.    Past Medical History:  Diagnosis Date   Aortic atherosclerosis ([place]) 06/07/2020   Seen on cervical xray 06/07/20   Asthma    None in years   Cancer [facility])    skin cancer nose    Cerebrovascular disease    Diverticulosis of colon 08/19/2010   Per CT scan   DJD (degenerative joint disease) of cervical spine    Generalized convulsive seizures ([place]) 30 YEARS AGO   Remotely, following head injury from [place] degeneration    Osteopenia    Seizure ([place])    none since before 2000   Vertigo 08/2010    Patient Active Problem List   Diagnosis Date Noted   Myeloproliferative disease ([place]) 06/10/2021   Abnormal CBC 04/17/2021   Elevated platelet count 04/17/2021   Sun-damaged skin 04/17/2021   Gastroesophageal reflux disease without esophagitis 01/15/2021   History of neoplasm 11/16/2020   Seborrheic keratosis 11/16/2020   Aortic atherosclerosis ([place]) 06/07/2020   Nontraumatic neck pain 06/05/2020   History of breast cancer in female 06/05/2020   Dysphonia 10/08/2019   Vasomotor rhinitis 10/08/2019   Ductal carcinoma in situ (DCIS) of right breast 08/29/2018   Malignant neoplasm of upper-outer quadrant of right female breast ([place]) 08/15/2018   Impaired fasting glucose 11/18/2014   Macular degeneration of right eye 02/05/2014   Dizziness and giddiness 08/19/2010   Hypokalemia 08/19/2010   Diverticulosis of colon 08/19/2010   DJD (degenerative joint disease) of cervical spine 08/19/2010   Cerebral microvascular disease  08/19/2010   Osteopenia     Past Surgical History:  Procedure Laterality Date   AXILLARY SENTINEL NODE BIOPSY Right 10/05/2018   Procedure: RIGHT AXILLARY SENTINEL LYMPH NODE BIOPSY;  Surgeon: Alphonsa Overall, MD;  Location: [facility];  Service: General;  Laterality: Right;   BREAST LUMPECTOMY WITH RADIOACTIVE SEED LOCALIZATION Right 09/21/2018   Procedure: RIGHT BREAST LUMPECTOMY WITH RADIOACTIVE SEED LOCALIZATION;  Surgeon: Alphonsa Overall, MD;  Location: [facility];  Service: General;  Laterality: Right;   COLONOSCOPY     TONSILLECTOMY     TUBAL LIGATION     TUBAL LIGATION      OB History   No obstetric history on file.      Home Medications    Prior to Admission medications   Medication Sig Start Date End Date Taking? Authorizing Provider  molnupiravir EUA (LAGEVRIO) 200 mg CAPS capsule Take 4 capsules (800 mg total) by mouth 2 (two) times daily for 5 days. 01/14/22 01/19/22 Yes Volney American, PA-C  promethazine-dextromethorphan (PROMETHAZINE-DM) 6.25-15 MG/5ML syrup Take 5 mLs by mouth 4 (four) times daily as needed. 01/14/22  Yes Volney American, PA-C  albuterol (VENTOLIN HFA) 108 (90 Base) MCG/ACT inhaler Inhale 2 puffs into the lungs 2 (two) times daily as needed for wheezing or shortness of breath. 08/12/19   Elvia Collum M, DO  aspirin EC 81 MG tablet Take 81 mg by mouth at bedtime.    [provider]  azelastine (ASTELIN) 0.1 % nasal spray Place 2 sprays into both nostrils 2 (two) times daily. 01/05/22   Kathyrn Drown, MD  Calcium Carbonate-Vit D-Min (CALTRATE 600+D PLUS MINERALS) 600-800 MG-UNIT CHEW     [provider]  cycloSPORINE (RESTASIS) 0.05 % ophthalmic emulsion Place 1 drop into both eyes 2 (two) times daily. 09/03/20   Nicholas Lose, MD  hydroxyurea (HYDREA) 500 MG capsule Take 500 mg (1 capsule) daily Monday through Friday.  May take with food to minimize GI side effects. 11/22/21   Harriett Rush, PA-C  montelukast  (SINGULAIR) 10 MG tablet TAKE ONE TABLET BY MOUTH ONCE DAILY. 10/27/21   Kathyrn Drown, MD  Multiple Vitamins-Minerals (CENTRUM SILVER 50+WOMEN PO)     [provider]  Multiple Vitamins-Minerals (PRESERVISION AREDS 2 PO) Take by mouth 2 (two) times daily.    [provider]  mupirocin ointment (BACTROBAN) 2 % Apply thin amount bid for 7 days 01/05/22   Kathyrn Drown, MD  Omega-3 Fatty Acids (FISH OIL) 1000 MG CAPS Take 1,000 mg by mouth daily.    [provider]  OVER THE COUNTER MEDICATION chlorimaletae '4mg'$  one every 4 hours prn allergies    [provider]  [place] juice and capsules    [provider]  triamcinolone (NASACORT) 55 MCG/ACT AERO nasal inhaler Place 2 sprays into the nose daily.    [provider]    Family History Family History  Problem Relation Age of Onset   Crohn's disease Mother    Colon cancer Maternal Uncle 60    Social History Social History   Tobacco Use   Smoking status: Never   Smokeless tobacco: Never  Vaping Use   Vaping Use: Never used  Substance Use Topics   Alcohol use: No   Drug use: No     Allergies   Patient has no known allergies.   Review of Systems Review of Systems PER HPI  Physical Exam Triage Vital Signs ED Triage Vitals  Enc Vitals Group     BP 01/14/22 1858 121/70     Pulse Rate 01/14/22 1858 (!) 110     Resp 01/14/22 1858 18     Temp 01/14/22 1858 (!) 101.9 F (38.8 C)     Temp Source 01/14/22 1858 Oral     SpO2 01/14/22 1858 94 %     Weight --      Height --      Head Circumference --      Peak Flow --      Pain Score 01/14/22 1859 4     Pain Loc --      Pain Edu? --      Excl. in [place]? --    No data found.  Updated Vital Signs BP 121/70 (BP Location: Right Arm)   Pulse (!) 110   Temp (!) 101.9 F (38.8 C) (Oral)   Resp 18   SpO2 94%   Visual Acuity Right Eye Distance:   Left Eye Distance:   Bilateral Distance:     Right Eye Near:   Left Eye Near:    Bilateral Near:     Physical Exam Vitals and nursing note reviewed.  Constitutional:      Appearance: Normal appearance.  HENT:     Head: Atraumatic.     Right Ear: Tympanic membrane and external ear normal.     Left Ear: Tympanic membrane and external ear normal.     Nose:  Rhinorrhea present.     Mouth/Throat:     Mouth: Mucous membranes are moist.     Pharynx: Posterior oropharyngeal erythema present.  Eyes:     Extraocular Movements: Extraocular movements intact.     Conjunctiva/sclera: Conjunctivae normal.  Cardiovascular:     Rate and Rhythm: Normal rate and regular rhythm.     Heart sounds: Normal heart sounds.  Pulmonary:     Effort: Pulmonary effort is normal.     Breath sounds: Normal breath sounds. No wheezing.  Musculoskeletal:        General: Normal range of motion.     Cervical back: Normal range of motion and neck supple.  Skin:    General: Skin is warm and dry.  Neurological:     Mental Status: She is alert and oriented to person, place, and time.  Psychiatric:        Mood and Affect: Mood normal.        Thought Content: Thought content normal.      UC Treatments / Results  Labs (all labs ordered are listed, but only abnormal results are displayed) Labs Reviewed - No data to display  EKG   Radiology No results found.  Procedures Procedures (including critical care time)  Medications Ordered in UC Medications - No data to display  Initial Impression / Assessment and Plan / UC Course  I have reviewed the triage vital signs and the nursing notes.  Pertinent labs & imaging results that were available during my care of the patient were reviewed by me and considered in my medical decision making (see chart for details).     Vitals and exam reassuring, suspicious for viral URI. Given home exposure to [place] will treat with molnupiravir, phenergan DM and supportive OTC medications. Return  for worsening sxs.  Final Clinical Impressions(s) / UC Diagnoses   Final diagnoses:  COVID   Discharge Instructions   None    ED Prescriptions     Medication Sig Dispense Auth. Provider   molnupiravir EUA (LAGEVRIO) 200 mg CAPS capsule Take 4 capsules (800 mg total) by mouth 2 (two) times daily for 5 days. 40 capsule Volney American, [place]   promethazine-dextromethorphan (PROMETHAZINE-DM) 6.25-15 MG/5ML syrup Take 5 mLs by mouth 4 (four) times daily as needed. 100 mL Volney American, [place]      PDMP not reviewed this encounter.   Volney American, [place] 01/18/22 1757

## 2022-01-27 ENCOUNTER — Other Ambulatory Visit: Payer: Self-pay | Admitting: Family Medicine

## 2022-02-14 ENCOUNTER — Telehealth: Payer: Self-pay | Admitting: *Deleted

## 2022-02-14 MED ORDER — BENZONATATE 100 MG PO CAPS: 100.0000 mg | ORAL_CAPSULE | Freq: Three times a day (TID) | ORAL | 3 refills | Status: DC | PRN

## 2022-02-14 NOTE — Telephone Encounter (Signed)
Prescription sent electronically to pharmacy. Patient notified. 

## 2022-02-14 NOTE — Telephone Encounter (Signed)
Patient left message on machine that she had Covid the week of Christmas and still has lingering cough and would like a prescription for Tessalon Perles sent in for the lingering cough

## 2022-02-14 NOTE — Telephone Encounter (Signed)
Tessalon Perles 100 mg 1 taken 3 times daily as needed for cough, #30, 3 refills, recommend follow-up office visit if ongoing troubles

## 2022-03-03 DIAGNOSIS — D0472 Carcinoma in situ of skin of left lower limb, including hip: Secondary | ICD-10-CM | POA: Diagnosis not present

## 2022-03-03 DIAGNOSIS — D225 Melanocytic nevi of trunk: Secondary | ICD-10-CM | POA: Diagnosis not present

## 2022-03-03 DIAGNOSIS — Z85828 Personal history of other malignant neoplasm of skin: Secondary | ICD-10-CM | POA: Diagnosis not present

## 2022-03-03 DIAGNOSIS — D216 Benign neoplasm of connective and other soft tissue of trunk, unspecified: Secondary | ICD-10-CM | POA: Diagnosis not present

## 2022-03-03 DIAGNOSIS — D3615 Benign neoplasm of peripheral nerves and autonomic nervous system of abdomen: Secondary | ICD-10-CM | POA: Diagnosis not present

## 2022-03-03 DIAGNOSIS — Z1283 Encounter for screening for malignant neoplasm of skin: Secondary | ICD-10-CM | POA: Diagnosis not present

## 2022-03-03 DIAGNOSIS — Z08 Encounter for follow-up examination after completed treatment for malignant neoplasm: Secondary | ICD-10-CM | POA: Diagnosis not present

## 2022-03-17 NOTE — Progress Notes (Signed)
[place], [place] [address]   CLINIC:  Medical Oncology/Hematology  PCP:  Kathyrn Drown, MD [address] [place] [address] [phone]   REASON FOR VISIT:  Follow-up for JAK2 essential thrombocytosis   PRIOR THERAPY: None   CURRENT THERAPY: Hydrea 500 mg Monday through Friday   INTERVAL HISTORY:   Yvonne Maynard 82 y.o. female returns for routine follow-up of her JAK2 essential thrombocytosis.  She was last seen by Tarri Abernethy PA-C on 11/16/2021.   At today's visit, she reports feeling fairly well, but reports medically eventful past few months with episode of gout in December 2023, followed by COVID in late December 2023.  She had some "low-grade skin cancer" removed from the posterior side of her left calf 3 weeks ago, and reports that the wound has been slow to heal.  She is taking Hydrea 500 mg Monday through Friday.  She is tolerating this well.  She denies any mouth ulcers, skin sores, or GI side effects.  Her energy levels are somewhat lower than what they used to be, she notices that for the past year she has had to rest more often than she did before.  She is still able to complete all of her ADLs independently and remains very active.  She has some generalized itching of her back and scalp.  She continues to complain of right foot and right lower leg pain as well as bilateral leg cramps at night - Xray showed arthritis.  She denies any Raynaud's phenomenon or other vasomotor symptoms such as dizziness, tinnitus, blurry vision, strokelike symptoms, or neuropathy.  She denies any unexplained fever/chills, night sweats, or weight loss.  No abdominal pain nausea, or early satiety.  She has 60% energy and 100% appetite. She endorses that she is maintaining a stable weight.   ASSESSMENT & PLAN:  1.  JAK2 V617F + essential thrombocytosis versus polycythemia vera - Seen at the request of her primary care provider (NP  Pearson Forster) in April 2023 for evaluation of elevated platelets and elevated hemoglobin. - JAK2 V617F was POSITIVE - Abdominal ultrasound (06/09/2021): Borderline splenomegaly measuring 12.1 cm with volume 421 cc. - Bone marrow biopsy (07/23/2021): Hypercellular bone marrow with pan-myeloid proliferation.  No significant increase in reticulin fibers.  Cytogenetics normal with 46,XX[5] - She only has one isolated episode of elevated Hgb/HCT, unclear at this time if clinical picture represents essential thrombocytosis or polycythemia vera - She has right leg and foot pain, with x-ray (06/24/2021) showing moderate to severe osteoarthritis with osteophytes. - She has generalized itching of her back and scalp.  Reddish discoloration of bilateral feet with tingling pain in her right foot.  She has some faint dusky red erythema of her bilateral fingers.  No B symptoms.    - She is taking aspirin 81 mg daily      - Started Hydrea on 05/27/2021.  Current dose is hydroxyurea 500 mg Monday through Friday.  She is tolerating this well.     - (03/18/2022): Nonhealing wound to left posterior calf due to excisional biopsy of "low-grade skin cancer" 3 weeks ago - Goal is to keep platelets <400 and HCT <45.0 to decrease risk of VTE.  However, if she is intolerant of Hydrea, could opt for conservative management with aspirin 81 mg alone as long as platelets are less than 500. - We have discussed pathophysiology of her JAK2+ MPN.  Given possible involvement of hemoglobin, this may represent polycythemia  vera rather than essential thrombocytosis.  We discussed that main risk of her condition is risk of DVT, PE, MI, and CVA.  We discussed that there is also some inherent risk of progression to leukemia or myelofibrosis. - Labs today (03/18/2022): Platelets 332, Hgb 13.7/hematocrit 41.7.  Normal WBC.  Normal CMP and LDH. - PLAN: Platelets and hematocrit at goal.  We will continue Hydrea 500 mg Monday through Friday, but she has  been instructed to HOLD Hydrea for the next 7 to 14 days to allow for healing of her left calf excisional biopsy wound. - Continue aspirin 81 mg daily - Repeat labs and RTC in 4 months  2.  History of right-sided stage Ia breast cancer - History of stage Ia right-sided breast cancer (ER/PR negative, HER2 positive), followed [facility] Dr. Nicholas Lose [facility]. - Right breast lumpectomy on 09/20/2018, followed by radiation therapy.  No role of adjuvant systemic chemotherapy or antiestrogen therapy because of the size of the tumor and the fact that it was estrogen receptor negative. - She continues to follow with Dr. Lindi Adie for annual survivorship visits and checkups (last seen May 2023) - She is up-to-date on her mammograms, most recently performed on 08/30/2021, BI-RADS Category 2, benign.   3.  Other history - PMH:  Stage Ia breast cancer, osteoarthritis, asthma, seizures, vertigo, and osteopenia. - SOCIAL: Patient lives at home with her husband.  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  She denies any history of tobacco, alcohol, or illicit drug use. - FAMILY:  Denies any family history of blood disorders or myeloproliferative neoplasms.  She reports that she had several maternal uncles with unspecified types of cancer.  Maternal aunt with breast cancer.  PLAN SUMMARY: >> Labs in 4 months = CBC/D, CMP, LDH >> OFFICE visit in 4 months (after labs)     REVIEW OF SYSTEMS:   Review of Systems  Constitutional:  Positive for fatigue. Negative for appetite change, chills, diaphoresis, fever and unexpected weight change.  HENT:   Negative for lump/mass and nosebleeds.   Eyes:  Negative for eye problems.  Respiratory:  Negative for cough, hemoptysis and shortness of breath.   Cardiovascular:  Negative for chest pain, leg swelling and palpitations.  Gastrointestinal:  Negative for abdominal pain, blood in stool, constipation, diarrhea, nausea and vomiting.   Genitourinary:  Negative for hematuria.   Musculoskeletal:  Positive for arthralgias.  Skin: Negative.   Neurological:  Negative for dizziness, headaches and light-headedness.  Hematological:  Does not bruise/bleed easily.     PHYSICAL EXAM:  ECOG PERFORMANCE STATUS: 1 - Symptomatic but completely ambulatory  There were no vitals filed for this visit. There were no vitals filed for this visit. Physical Exam Constitutional:      Appearance: Normal appearance.  HENT:     Head: Normocephalic and atraumatic.     Mouth/Throat:     Mouth: Mucous membranes are moist.  Eyes:     Extraocular Movements: Extraocular movements intact.     Pupils: Pupils are equal, round, and reactive to light.  Cardiovascular:     Rate and Rhythm: Normal rate and regular rhythm.     Pulses: Normal pulses.     Heart sounds: Normal heart sounds.  Pulmonary:     Effort: Pulmonary effort is normal.     Breath sounds: Normal breath sounds.  Abdominal:     General: Bowel sounds are normal.     Palpations: Abdomen is soft.  There is no hepatomegaly or splenomegaly.     Tenderness: There is no abdominal tenderness.  Musculoskeletal:        General: No swelling.     Right lower leg: No edema.     Left lower leg: No edema.  Lymphadenopathy:     Cervical: No cervical adenopathy.  Skin:    General: Skin is warm and dry.     Findings: Lesion (Small (1 cm) wound to posterior aspect of left calf due to excisional skin biopsy 3 weeks ago) present.     Comments: Previously noted faint dusky red erythema of fingers of bilateral hands has RESOLVED  Neurological:     General: No focal deficit present.     Mental Status: She is alert and oriented to person, place, and time.  Psychiatric:        Mood and Affect: Mood normal.        Behavior: Behavior normal.    PAST MEDICAL/SURGICAL HISTORY:  Past Medical History:  Diagnosis Date   Aortic atherosclerosis ([place]) 06/07/2020   Seen on cervical xray 06/07/20   Asthma     None in years   Cancer [facility])    skin cancer nose    Cerebrovascular disease    Diverticulosis of colon 08/19/2010   Per CT scan   DJD (degenerative joint disease) of cervical spine    Generalized convulsive seizures ([place]) 30 YEARS AGO   Remotely, following head injury from [place] degeneration    Osteopenia    Seizure ([place])    none since before 2000   Vertigo 08/2010   Past Surgical History:  Procedure Laterality Date   AXILLARY SENTINEL NODE BIOPSY Right 10/05/2018   Procedure: RIGHT AXILLARY SENTINEL LYMPH NODE BIOPSY;  Surgeon: Alphonsa Overall, MD;  Location: [facility];  Service: General;  Laterality: Right;   BREAST LUMPECTOMY WITH RADIOACTIVE SEED LOCALIZATION Right 09/21/2018   Procedure: RIGHT BREAST LUMPECTOMY WITH RADIOACTIVE SEED LOCALIZATION;  Surgeon: Alphonsa Overall, MD;  Location: [facility];  Service: General;  Laterality: Right;   COLONOSCOPY     TONSILLECTOMY     TUBAL LIGATION     TUBAL LIGATION      SOCIAL HISTORY:  Social History   Socioeconomic History   Marital status: Married    Spouse name: Jeneen Rinks   Number of children: 3   Years of education: Not on file   Highest education level: Not on file  Occupational History   Not on file  Tobacco Use   Smoking status: Never   Smokeless tobacco: Never  Vaping Use   Vaping Use: Never used  Substance and Sexual Activity   Alcohol use: No   Drug use: No   Sexual activity: Yes  Other Topics Concern   Not on file  Social History Narrative   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   Social Determinants of Health   Financial Resource Strain: Low Risk  (11/17/2020)   Overall Financial Resource Strain (CARDIA)    Difficulty of Paying Living Expenses: Not hard at all  Food Insecurity: No Food Insecurity (11/17/2020)   Hunger Vital Sign    Worried About Running Out of Food in the Last Year: Never true    Ran Out of Food in the Last Year: Never true   Transportation Needs: No Transportation Needs (11/17/2020)   PRAPARE - Hydrologist (Medical): No    Lack of Transportation (Non-Medical): No  Physical Activity: Sufficiently Active (11/17/2020)   Exercise Vital Sign    Days of Exercise per Week: 5 days    Minutes of Exercise per Session: 30 min  Stress: No Stress Concern Present (11/17/2020)   [place]    Feeling of Stress : Not at all  Social Connections: [place] (11/17/2020)   Social Connection and Isolation Panel [NHANES]    Frequency of Communication with Friends and Family: More than three times a week    Frequency of Social Gatherings with Friends and Family: More than three times a week    Attends Religious Services: More than 4 times per year    Active Member of [employer] or Organizations: Yes    Attends Music therapist: More than 4 times per year    Marital Status: Married  Human resources officer Violence: Not At Risk (11/17/2020)   Humiliation, Afraid, Rape, and Kick questionnaire    Fear of Current or Ex-Partner: No    Emotionally Abused: No    Physically Abused: No    Sexually Abused: No    FAMILY HISTORY:  Family History  Problem Relation Age of Onset   Crohn's disease Mother    Colon cancer Maternal Uncle 45    CURRENT MEDICATIONS:  Outpatient Encounter Medications as of 03/18/2022  Medication Sig   benzonatate (TESSALON) 100 MG capsule Take 1 capsule (100 mg total) by mouth 3 (three) times daily as needed for cough.   albuterol (VENTOLIN HFA) 108 (90 Base) MCG/ACT inhaler Inhale 2 puffs into the lungs 2 (two) times daily as needed for wheezing or shortness of breath.   aspirin EC 81 MG tablet Take 81 mg by mouth at bedtime.   azelastine (ASTELIN) 0.1 % nasal spray Place 2 sprays into both nostrils 2 (two) times daily.   Calcium Carbonate-Vit D-Min (CALTRATE 600+D PLUS MINERALS) 600-800 MG-UNIT CHEW     cycloSPORINE (RESTASIS) 0.05 % ophthalmic emulsion Place 1 drop into both eyes 2 (two) times daily.   hydroxyurea (HYDREA) 500 MG capsule Take 500 mg (1 capsule) daily Monday through Friday.  May take with food to minimize GI side effects.   montelukast (SINGULAIR) 10 MG tablet TAKE ONE TABLET BY MOUTH ONCE DAILY.   Multiple Vitamins-Minerals (CENTRUM SILVER 50+WOMEN PO)    Multiple Vitamins-Minerals (PRESERVISION AREDS 2 PO) Take by mouth 2 (two) times daily.   mupirocin ointment (BACTROBAN) 2 % Apply thin amount bid for 7 days   Omega-3 Fatty Acids (FISH OIL) 1000 MG CAPS Take 1,000 mg by mouth daily.   OVER THE COUNTER MEDICATION chlorimaletae '4mg'$  one every 4 hours prn allergies   OVER THE COUNTER MEDICATION [place] juice and capsules   promethazine-dextromethorphan (PROMETHAZINE-DM) 6.25-15 MG/5ML syrup Take 5 mLs by mouth 4 (four) times daily as needed.   triamcinolone (NASACORT) 55 MCG/ACT AERO nasal inhaler Place 2 sprays into the nose daily.   No facility-administered encounter medications on file as of 03/18/2022.    ALLERGIES:  No Known Allergies  LABORATORY DATA:  I have reviewed the labs as listed.  CBC    Component Value Date/Time   WBC 5.5 11/16/2021 0900   RBC 3.94 11/16/2021 0900   HGB 13.8 11/16/2021 0900   HGB 16.6 (H) 03/18/2021 0803   HCT 40.3 11/16/2021 0900   HCT 50.9 (H) 03/18/2021 0803   PLT 306 11/16/2021 0900   PLT 583 (H) 03/18/2021 0803   MCV 102.3 (H) 11/16/2021 0900   MCV 92 03/18/2021  0803   MCH 35.0 (H) 11/16/2021 0900   MCHC 34.2 11/16/2021 0900   RDW 14.0 11/16/2021 0900   RDW 13.9 03/18/2021 0803   LYMPHSABS 1.3 11/16/2021 0900   LYMPHSABS 2.0 03/18/2021 0803   MONOABS 0.3 11/16/2021 0900   EOSABS 0.1 11/16/2021 0900   EOSABS 0.3 03/18/2021 0803   BASOSABS 0.1 11/16/2021 0900   BASOSABS 0.1 03/18/2021 0803      Latest Ref Rng & Units 11/16/2021    9:00 AM 08/03/2021   12:40 PM 06/24/2021   10:09 AM  CMP  Glucose 70 - 99 mg/dL 96  128   104   BUN 8 - 23 mg/dL '15  12  15   '$ Creatinine 0.44 - 1.00 mg/dL 0.76  0.65  0.78   Sodium 135 - 145 mmol/L 138  140  140   Potassium 3.5 - 5.1 mmol/L 3.9  3.9  4.1   Chloride 98 - 111 mmol/L 107  111  105   CO2 22 - 32 mmol/L '25  24  27   '$ Calcium 8.9 - 10.3 mg/dL 9.0  8.8  9.1   Total Protein 6.5 - 8.1 g/dL 6.4  6.5  6.6   Total Bilirubin 0.3 - 1.2 mg/dL 0.8  0.6  0.8   Alkaline Phos 38 - 126 U/L 62  66  69   AST 15 - 41 U/L '20  22  21   '$ ALT 0 - 44 U/L '20  22  22     '$ DIAGNOSTIC IMAGING:  I have independently reviewed the relevant imaging and discussed with the patient.   WRAP UP:  All questions were answered. The patient knows to call the clinic with any problems, questions or concerns.  Medical decision making: Moderate  Time spent on visit: I spent 20 minutes counseling the patient face to face. The total time spent in the appointment was 30 minutes and more than 50% was on counseling.  Harriett Rush, PA-C  03/18/22 11:03 AM

## 2022-03-18 ENCOUNTER — Inpatient Hospital Stay: Payer: PPO

## 2022-03-18 ENCOUNTER — Inpatient Hospital Stay: Payer: PPO | Attending: Physician Assistant | Admitting: Physician Assistant

## 2022-03-18 VITALS — BP 116/63 | HR 72 | Temp 97.5°F | Resp 16

## 2022-03-18 DIAGNOSIS — D75839 Thrombocytosis, unspecified: Secondary | ICD-10-CM | POA: Diagnosis not present

## 2022-03-18 DIAGNOSIS — Z853 Personal history of malignant neoplasm of breast: Secondary | ICD-10-CM | POA: Insufficient documentation

## 2022-03-18 DIAGNOSIS — D471 Chronic myeloproliferative disease: Secondary | ICD-10-CM

## 2022-03-18 DIAGNOSIS — Z7982 Long term (current) use of aspirin: Secondary | ICD-10-CM | POA: Insufficient documentation

## 2022-03-18 DIAGNOSIS — Z1589 Genetic susceptibility to other disease: Secondary | ICD-10-CM

## 2022-03-18 DIAGNOSIS — Z85828 Personal history of other malignant neoplasm of skin: Secondary | ICD-10-CM | POA: Diagnosis not present

## 2022-03-18 DIAGNOSIS — D473 Essential (hemorrhagic) thrombocythemia: Secondary | ICD-10-CM

## 2022-03-18 LAB — CBC WITH DIFFERENTIAL/PLATELET
Abs Immature Granulocytes: 0.03 10*3/uL (ref 0.00–0.07)
Basophils Absolute: 0.1 10*3/uL (ref 0.0–0.1)
Basophils Relative: 2 %
Eosinophils Absolute: 0.2 10*3/uL (ref 0.0–0.5)
Eosinophils Relative: 3 %
HCT: 41.7 % (ref 36.0–46.0)
Hemoglobin: 13.7 g/dL (ref 12.0–15.0)
Immature Granulocytes: 1 %
Lymphocytes Relative: 25 %
Lymphs Abs: 1.6 10*3/uL (ref 0.7–4.0)
MCH: 34.1 pg — ABNORMAL HIGH (ref 26.0–34.0)
MCHC: 32.9 g/dL (ref 30.0–36.0)
MCV: 103.7 fL — ABNORMAL HIGH (ref 80.0–100.0)
Monocytes Absolute: 0.3 10*3/uL (ref 0.1–1.0)
Monocytes Relative: 5 %
Neutro Abs: 4.2 10*3/uL (ref 1.7–7.7)
Neutrophils Relative %: 64 %
Platelets: 332 10*3/uL (ref 150–400)
RBC: 4.02 MIL/uL (ref 3.87–5.11)
RDW: 14.3 % (ref 11.5–15.5)
WBC: 6.5 10*3/uL (ref 4.0–10.5)
nRBC: 0 % (ref 0.0–0.2)

## 2022-03-18 LAB — COMPREHENSIVE METABOLIC PANEL
ALT: 22 U/L (ref 0–44)
AST: 20 U/L (ref 15–41)
Albumin: 3.8 g/dL (ref 3.5–5.0)
Alkaline Phosphatase: 66 U/L (ref 38–126)
Anion gap: 6 (ref 5–15)
BUN: 14 mg/dL (ref 8–23)
CO2: 27 mmol/L (ref 22–32)
Calcium: 8.7 mg/dL — ABNORMAL LOW (ref 8.9–10.3)
Chloride: 105 mmol/L (ref 98–111)
Creatinine, Ser: 0.76 mg/dL (ref 0.44–1.00)
GFR, Estimated: >60 mL/min (ref >60)
Glucose, Bld: 96 mg/dL (ref 70–99)
Potassium: 4 mmol/L (ref 3.5–5.1)
Sodium: 138 mmol/L (ref 135–145)
Total Bilirubin: 0.6 mg/dL (ref 0.3–1.2)
Total Protein: 6.5 g/dL (ref 6.5–8.1)

## 2022-03-18 LAB — LACTATE DEHYDROGENASE: LDH: 141 U/L (ref 98–192)

## 2022-03-18 NOTE — Patient Instructions (Signed)
[place] at [facility] Discharge Instructions  You were seen today by Tarri Abernethy PA-C for your follow-up visit to discuss your polycythemia.  Your blood counts look great today!  You should continue to take Hydrea (hydroxyurea) 1 tablet (500 mg) Monday through Friday.  >> You can temporarily STOP your Hydrea for the next week to allow the sore on your leg to heal.  If your wound has not improved in a week, you can go a maximum of 2 weeks without your Hydrea.  If you need any additional advice during this time, do not hesitate to reach out to [place].   FOLLOW-UP APPOINTMENT: Same-day labs and office visit in 4 months.  However, if you have any changes or concerns before that time, do not hesitate to reach out and request an earlier appointment if needed.  - - - - - - - - - - - - - - - - -  Thank you for choosing [place] at [facility] to provide your oncology and hematology care.  To afford each patient quality time with our provider, please arrive at least 15 minutes before your scheduled appointment time.   If you have a lab appointment with the [facility] please come in thru the Main Entrance and check in at the main information desk.  You need to re-schedule your appointment should you arrive 10 or more minutes late.  We strive to give you quality time with our providers, and arriving late affects you and other patients whose appointments are after yours.  Also, if you no show three or more times for appointments you may be dismissed from the clinic at the providers discretion.     Again, thank you for choosing  [facility].  Our hope is that these requests will decrease the amount of time that you wait before being seen by our physicians.       _____________________________________________________________  Should you have questions after your visit to [facility] please contact our office at [phone]  and follow the prompts.  Our office hours are 8:00 a.m. and 4:30 p.m. Monday - Friday.  Please note that voicemails left after 4:00 p.m. may not be returned until the following business day.  We are closed weekends and major holidays.  You do have access to a nurse 24-7, just call the main number to the clinic [phone] and do not press any options, hold on the line and a nurse will answer the phone.    For prescription refill requests, have your pharmacy contact our office and allow 72 hours.    Due to Covid, you will need to wear a mask upon entering the hospital. If you do not have a mask, a mask will be given to you at the Main Entrance upon arrival. For doctor visits, patients may have 1 support person age 38 or older with them. For treatment visits, patients can not have anyone with them due to social distancing guidelines and our immunocompromised population.

## 2022-04-04 ENCOUNTER — Telehealth: Payer: Self-pay

## 2022-04-04 NOTE — Telephone Encounter (Signed)
Pt wants to know if she needs blood work ordered she has had it through hospital and she has upcoming appt April the 5th with Hoyle Sauer wants to know if she needs blood work.   Roselee Culver - [phone]

## 2022-04-06 ENCOUNTER — Telehealth: Payer: Self-pay | Admitting: Hematology and Oncology

## 2022-04-06 NOTE — Telephone Encounter (Signed)
Per 3/19 IB reached out to patient to reschedule unable to leave voicemail.

## 2022-04-08 ENCOUNTER — Other Ambulatory Visit: Payer: Self-pay | Admitting: Nurse Practitioner

## 2022-04-08 DIAGNOSIS — Z13220 Encounter for screening for lipoid disorders: Secondary | ICD-10-CM

## 2022-04-08 DIAGNOSIS — R7301 Impaired fasting glucose: Secondary | ICD-10-CM

## 2022-04-08 DIAGNOSIS — Z1329 Encounter for screening for other suspected endocrine disorder: Secondary | ICD-10-CM

## 2022-04-14 ENCOUNTER — Other Ambulatory Visit: Payer: Self-pay | Admitting: Physician Assistant

## 2022-04-14 ENCOUNTER — Other Ambulatory Visit: Payer: Self-pay | Admitting: *Deleted

## 2022-04-14 DIAGNOSIS — Z08 Encounter for follow-up examination after completed treatment for malignant neoplasm: Secondary | ICD-10-CM | POA: Diagnosis not present

## 2022-04-14 DIAGNOSIS — Z1589 Genetic susceptibility to other disease: Secondary | ICD-10-CM

## 2022-04-14 DIAGNOSIS — Z85828 Personal history of other malignant neoplasm of skin: Secondary | ICD-10-CM | POA: Diagnosis not present

## 2022-04-14 DIAGNOSIS — D473 Essential (hemorrhagic) thrombocythemia: Secondary | ICD-10-CM

## 2022-04-14 NOTE — Telephone Encounter (Signed)
Hydrea refill approved.  Patient is tolerating and is to continue therapy. 

## 2022-04-22 ENCOUNTER — Ambulatory Visit (INDEPENDENT_AMBULATORY_CARE_PROVIDER_SITE_OTHER): Payer: PPO | Admitting: Nurse Practitioner

## 2022-04-22 VITALS — BP 112/54 | HR 68 | Temp 98.3°F | Ht 63.0 in | Wt 136.0 lb

## 2022-04-22 DIAGNOSIS — Z0000 Encounter for general adult medical examination without abnormal findings: Secondary | ICD-10-CM | POA: Diagnosis not present

## 2022-04-22 DIAGNOSIS — Z01419 Encounter for gynecological examination (general) (routine) without abnormal findings: Secondary | ICD-10-CM

## 2022-04-22 NOTE — Progress Notes (Unsigned)
   Subjective:    Patient ID: Yvonne Maynard, female    DOB: 08/16/1940, 82 y.o.   MRN: [id number]  HPI The patient comes in today for a wellness visit.    A review of their health history was completed.  A review of medications was also completed.  Any needed refills; none  Eating habits: good  Falls/  MVA accidents in past few months: no  Regular exercise: eliptical, bike, house work, Magazine features editor pt sees on regular basis:   Preventative health issues were discussed.   Additional concerns: chronic cough and rhinitis     Review of Systems     Objective:   Physical Exam        Assessment & Plan:

## 2022-04-22 NOTE — Progress Notes (Unsigned)
Subjective:    Patient ID: Yvonne Maynard, female    DOB: 04-Aug-1940, 82 y.o.   MRN: [id number]  HPI Yvonne Maynard is being seen today for her annual physical. Has healthy diet with a variety of foods. She exercises by doing housework and using the elliptical and recombinant bike. She has had some increased right knee pain for about three weeks. She injured it with kneeling while doing yardwork  and it has made her arthritis in her knee worse. Tylenol does not relieve pain. She is not currently sexually active. Denies need for pelvic exam. She does not smoke cigarettes, drink alcohol, or participate in drug use. Her sleep is good. Gets regular dental and eye exams. Up to date on colonoscopy, mammogram, and bone density scans. Follows up with regular skin checks with a dermatologist. Up to date on vaccinations.     11/16/2021    9:47 AM 12/24/2021    8:29 AM 12/29/2021    6:28 PM 01/14/2022    7:04 PM 04/22/2022   10:04 AM  Fall Risk  Falls in the past year?     0  Was there an injury with Fall?     0  Fall Risk Category Calculator     0  (RETIRED) Patient Fall Risk Level Low fall risk Low fall risk Low fall risk Low fall risk   Patient at Risk for Falls Due to     No Fall Risks  Fall risk Follow up     Falls evaluation completed    Mini-Cog - 04/22/22 1100     Normal clock drawing test? yes    How many words correct? 3            She does complain of a productive cough that has been occurring for years. She has tried several OTC cough medications in the past with no relief. She has tried allergy medicine as well, but reports that it just causes her eyes to be extremely dry with no cough relief. She has been followed up for the cough in the past including chest xray which she states was normal. No change in the cough.   Review of Systems  Constitutional:  Negative for chills, fatigue and fever.  HENT:  Negative for congestion, rhinorrhea, sinus pressure, sinus pain, sore throat and  trouble swallowing.   Respiratory:  Positive for cough. Negative for chest tightness, shortness of breath and wheezing.   Cardiovascular:  Negative for chest pain and leg swelling.  Gastrointestinal:  Negative for constipation, diarrhea, nausea and vomiting.  Genitourinary:  Negative for difficulty urinating, dysuria, enuresis, frequency, genital sores, pelvic pain, urgency, vaginal bleeding and vaginal discharge.  Musculoskeletal:  Negative for gait problem.       Positive for right knee pain and stiffness.   Psychiatric/Behavioral:  Negative for sleep disturbance. The patient is not nervous/anxious.       Objective:   Physical Exam Vitals and nursing note reviewed.  Constitutional:      General: She is not in acute distress.    Appearance: She is normal weight. She is not ill-appearing.  HENT:     Right Ear: Tympanic membrane, ear canal and external ear normal.     Left Ear: Tympanic membrane, ear canal and external ear normal.     Mouth/Throat:     Mouth: Mucous membranes are moist.     Pharynx: Oropharynx is clear. No posterior oropharyngeal erythema.  Cardiovascular:     Rate and Rhythm: Normal rate and  regular rhythm.     Heart sounds: No murmur heard. Pulmonary:     Effort: Pulmonary effort is normal. No respiratory distress.     Breath sounds: No stridor. No wheezing.  Chest:  Breasts:    Right: No inverted nipple, mass, skin change or tenderness.     Left: No inverted nipple, mass, skin change or tenderness.  Abdominal:     General: Abdomen is flat. There is no distension.     Palpations: Abdomen is soft. There is no mass.     Tenderness: There is no abdominal tenderness.  Genitourinary:    Comments: Defers pelvic exam. Denies any rash, irritation or skin changes.  Musculoskeletal:     Comments: Pain with AROM on right knee.   Lymphadenopathy:     Cervical: No cervical adenopathy.     Upper Body:     Right upper body: No supraclavicular, axillary or pectoral  adenopathy.     Left upper body: No supraclavicular, axillary or pectoral adenopathy.  Skin:    General: Skin is warm and dry.  Neurological:     Mental Status: She is alert.  Psychiatric:        Mood and Affect: Mood normal.        Behavior: Behavior normal.        Thought Content: Thought content normal.        Judgment: Judgment normal.    Vitals:   04/22/22 0935  BP: (!) 112/54  Pulse: 68  Temp: 98.3 F (36.8 C)  Height: 5\' 3"  (1.6 m)  Weight: 136 lb (61.7 kg)  SpO2: 98%  BMI (Calculated): 24.1       04/22/2022   10:04 AM  Depression screen PHQ 2/9  Decreased Interest 3  Down, Depressed, Hopeless 0  PHQ - 2 Score 3  Altered sleeping 0  Tired, decreased energy 1  Change in appetite 0  Feeling bad or failure about yourself  0  Trouble concentrating 0  Moving slowly or fidgety/restless 0  Suicidal thoughts 0  PHQ-9 Score 4  Difficult doing work/chores Not difficult at all        04/22/2022   10:04 AM 01/05/2022    3:56 PM 01/05/2022    3:44 PM  GAD 7 : Generalized Anxiety Score  Nervous, Anxious, on Edge 0 0 0  Control/stop worrying 0 0 0  Worry too much - different things 0 0 0  Trouble relaxing 0 0 0  Restless 0 0 0  Easily annoyed or irritable 0 0 0  Afraid - awful might happen 0 0 0  Total GAD 7 Score 0 0 0  Anxiety Difficulty Not difficult at all Not difficult at all Not difficult at all      Assessment & Plan:  1. Encounter for Medicare annual wellness exam  2. Well woman exam -Educated patient on continuing to follow a healthy diet and exercise.  Patient plans to get labs done that were ordered 04/08/22.  Return in about 1 year (around 04/22/2023) for physical.

## 2022-04-22 NOTE — Patient Instructions (Signed)
Ast[place]

## 2022-04-23 ENCOUNTER — Encounter: Payer: Self-pay | Admitting: Nurse Practitioner

## 2022-04-25 DIAGNOSIS — Z13220 Encounter for screening for lipoid disorders: Secondary | ICD-10-CM | POA: Diagnosis not present

## 2022-04-25 DIAGNOSIS — R7301 Impaired fasting glucose: Secondary | ICD-10-CM | POA: Diagnosis not present

## 2022-04-25 DIAGNOSIS — Z1329 Encounter for screening for other suspected endocrine disorder: Secondary | ICD-10-CM | POA: Diagnosis not present

## 2022-04-26 LAB — TSH: TSH: 1.97 u[IU]/mL (ref 0.450–4.500)

## 2022-04-26 LAB — HEMOGLOBIN A1C
Est. average glucose Bld gHb Est-mCnc: 111 mg/dL
Hgb A1c MFr Bld: 5.5 % (ref 4.8–5.6)

## 2022-04-26 LAB — LIPID PANEL
Chol/HDL Ratio: 4.1 ratio (ref 0.0–4.4)
Cholesterol, Total: 182 mg/dL (ref 100–199)
HDL: 44 mg/dL (ref >39)
LDL Chol Calc (NIH): 110 mg/dL — ABNORMAL HIGH (ref 0–99)
Triglycerides: 160 mg/dL — ABNORMAL HIGH (ref 0–149)
VLDL Cholesterol Cal: 28 mg/dL (ref 5–40)

## 2022-04-27 ENCOUNTER — Other Ambulatory Visit: Payer: Self-pay | Admitting: Family Medicine

## 2022-05-05 NOTE — Telephone Encounter (Signed)
Labs have been done.

## 2022-05-19 ENCOUNTER — Other Ambulatory Visit: Payer: Self-pay | Admitting: *Deleted

## 2022-05-19 ENCOUNTER — Other Ambulatory Visit: Payer: Self-pay | Admitting: Hematology

## 2022-05-19 DIAGNOSIS — Z1589 Genetic susceptibility to other disease: Secondary | ICD-10-CM

## 2022-05-19 DIAGNOSIS — D473 Essential (hemorrhagic) thrombocythemia: Secondary | ICD-10-CM

## 2022-05-19 NOTE — Telephone Encounter (Signed)
Hydrea refill approved.  Patient is tolerating and is to continue therapy. 

## 2022-05-27 ENCOUNTER — Telehealth: Payer: Self-pay | Admitting: Hematology and Oncology

## 2022-06-14 ENCOUNTER — Ambulatory Visit: Payer: PPO | Admitting: Hematology and Oncology

## 2022-06-24 ENCOUNTER — Other Ambulatory Visit: Payer: Self-pay | Admitting: Hematology

## 2022-06-24 DIAGNOSIS — D473 Essential (hemorrhagic) thrombocythemia: Secondary | ICD-10-CM

## 2022-06-24 DIAGNOSIS — Z1589 Genetic susceptibility to other disease: Secondary | ICD-10-CM

## 2022-06-27 DIAGNOSIS — H353211 Exudative age-related macular degeneration, right eye, with active choroidal neovascularization: Secondary | ICD-10-CM | POA: Diagnosis not present

## 2022-07-01 DIAGNOSIS — H353211 Exudative age-related macular degeneration, right eye, with active choroidal neovascularization: Secondary | ICD-10-CM | POA: Diagnosis not present

## 2022-07-01 DIAGNOSIS — H353121 Nonexudative age-related macular degeneration, left eye, early dry stage: Secondary | ICD-10-CM | POA: Diagnosis not present

## 2022-07-01 DIAGNOSIS — H43813 Vitreous degeneration, bilateral: Secondary | ICD-10-CM | POA: Diagnosis not present

## 2022-07-18 DIAGNOSIS — C50411 Malignant neoplasm of upper-outer quadrant of right female breast: Secondary | ICD-10-CM | POA: Diagnosis not present

## 2022-07-18 DIAGNOSIS — Z171 Estrogen receptor negative status [ER-]: Secondary | ICD-10-CM | POA: Diagnosis not present

## 2022-07-25 ENCOUNTER — Other Ambulatory Visit: Payer: Self-pay | Admitting: Family Medicine

## 2022-07-29 ENCOUNTER — Inpatient Hospital Stay: Payer: PPO | Attending: Physician Assistant

## 2022-07-29 DIAGNOSIS — D473 Essential (hemorrhagic) thrombocythemia: Secondary | ICD-10-CM | POA: Insufficient documentation

## 2022-07-29 DIAGNOSIS — D471 Chronic myeloproliferative disease: Secondary | ICD-10-CM

## 2022-07-29 DIAGNOSIS — D75839 Thrombocytosis, unspecified: Secondary | ICD-10-CM | POA: Insufficient documentation

## 2022-07-29 LAB — COMPREHENSIVE METABOLIC PANEL
ALT: 26 U/L (ref 0–44)
AST: 22 U/L (ref 15–41)
Albumin: 3.9 g/dL (ref 3.5–5.0)
Alkaline Phosphatase: 64 U/L (ref 38–126)
Anion gap: 6 (ref 5–15)
BUN: 13 mg/dL (ref 8–23)
CO2: 25 mmol/L (ref 22–32)
Calcium: 8.9 mg/dL (ref 8.9–10.3)
Chloride: 104 mmol/L (ref 98–111)
Creatinine, Ser: 0.87 mg/dL (ref 0.44–1.00)
GFR, Estimated: >60 mL/min (ref >60)
Glucose, Bld: 99 mg/dL (ref 70–99)
Potassium: 4 mmol/L (ref 3.5–5.1)
Sodium: 135 mmol/L (ref 135–145)
Total Bilirubin: 0.7 mg/dL (ref 0.3–1.2)
Total Protein: 6.5 g/dL (ref 6.5–8.1)

## 2022-07-29 LAB — CBC WITH DIFFERENTIAL/PLATELET
Abs Immature Granulocytes: 0.02 10*3/uL (ref 0.00–0.07)
Basophils Absolute: 0.1 10*3/uL (ref 0.0–0.1)
Basophils Relative: 2 %
Eosinophils Absolute: 0.1 10*3/uL (ref 0.0–0.5)
Eosinophils Relative: 2 %
HCT: 40.5 % (ref 36.0–46.0)
Hemoglobin: 13.7 g/dL (ref 12.0–15.0)
Immature Granulocytes: 0 %
Lymphocytes Relative: 22 %
Lymphs Abs: 1.3 10*3/uL (ref 0.7–4.0)
MCH: 34.6 pg — ABNORMAL HIGH (ref 26.0–34.0)
MCHC: 33.8 g/dL (ref 30.0–36.0)
MCV: 102.3 fL — ABNORMAL HIGH (ref 80.0–100.0)
Monocytes Absolute: 0.4 10*3/uL (ref 0.1–1.0)
Monocytes Relative: 7 %
Neutro Abs: 3.9 10*3/uL (ref 1.7–7.7)
Neutrophils Relative %: 67 %
Platelets: 244 10*3/uL (ref 150–400)
RBC: 3.96 MIL/uL (ref 3.87–5.11)
RDW: 14.6 % (ref 11.5–15.5)
WBC: 5.9 10*3/uL (ref 4.0–10.5)
nRBC: 0 % (ref 0.0–0.2)

## 2022-07-29 LAB — LACTATE DEHYDROGENASE: LDH: 164 U/L (ref 98–192)

## 2022-08-04 DIAGNOSIS — H43813 Vitreous degeneration, bilateral: Secondary | ICD-10-CM | POA: Diagnosis not present

## 2022-08-04 DIAGNOSIS — H353121 Nonexudative age-related macular degeneration, left eye, early dry stage: Secondary | ICD-10-CM | POA: Diagnosis not present

## 2022-08-04 DIAGNOSIS — H353211 Exudative age-related macular degeneration, right eye, with active choroidal neovascularization: Secondary | ICD-10-CM | POA: Diagnosis not present
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
## 2022-08-05 ENCOUNTER — Inpatient Hospital Stay (HOSPITAL_BASED_OUTPATIENT_CLINIC_OR_DEPARTMENT_OTHER): Payer: PPO | Admitting: Physician Assistant

## 2022-08-05 VITALS — BP 124/68 | HR 88 | Temp 98.3°F | Resp 16 | Wt 134.3 lb

## 2022-08-05 DIAGNOSIS — D75839 Thrombocytosis, unspecified: Secondary | ICD-10-CM | POA: Diagnosis not present

## 2022-08-05 DIAGNOSIS — D473 Essential (hemorrhagic) thrombocythemia: Secondary | ICD-10-CM

## 2022-08-05 DIAGNOSIS — Z1589 Genetic susceptibility to other disease: Secondary | ICD-10-CM | POA: Diagnosis not present

## 2022-08-05 DIAGNOSIS — D471 Chronic myeloproliferative disease: Secondary | ICD-10-CM | POA: Diagnosis not present

## 2022-09-05 DIAGNOSIS — N958 Other specified menopausal and perimenopausal disorders: Secondary | ICD-10-CM | POA: Diagnosis not present

## 2022-09-05 DIAGNOSIS — M8588 Other specified disorders of bone density and structure, other site: Secondary | ICD-10-CM | POA: Diagnosis not present

## 2022-09-05 DIAGNOSIS — C50411 Malignant neoplasm of upper-outer quadrant of right female breast: Secondary | ICD-10-CM | POA: Diagnosis not present

## 2022-09-05 DIAGNOSIS — Z1231 Encounter for screening mammogram for malignant neoplasm of breast: Secondary | ICD-10-CM | POA: Diagnosis not present

## 2022-09-08 ENCOUNTER — Encounter: Payer: Self-pay | Admitting: Hematology and Oncology

## 2022-09-08 DIAGNOSIS — H43813 Vitreous degeneration, bilateral: Secondary | ICD-10-CM | POA: Diagnosis not present

## 2022-09-08 DIAGNOSIS — H353211 Exudative age-related macular degeneration, right eye, with active choroidal neovascularization: Secondary | ICD-10-CM | POA: Diagnosis not present

## 2022-09-08 DIAGNOSIS — H353121 Nonexudative age-related macular degeneration, left eye, early dry stage: Secondary | ICD-10-CM | POA: Diagnosis not present

## 2022-09-26 NOTE — Progress Notes (Signed)
Patient Care Team: Babs Sciara, MD as PCP - General (Family Medicine) Serena Croissant, MD as Consulting Physician (Hematology and Oncology) Lonie Peak, MD as Attending Physician (Radiation Oncology) Abigail Miyamoto, MD as Consulting Physician (General Surgery)  DIAGNOSIS: No diagnosis found.  SUMMARY OF ONCOLOGIC HISTORY: Oncology History  Malignant neoplasm of upper-outer quadrant of right female breast (HCC)  08/08/2018 Cancer Staging   Staging form: Breast, AJCC 8th Edition - Clinical stage from 08/08/2018: Stage 0 (cTis (DCIS), cN0, cM0, ER: Unknown, PR: Unknown, HER2: Not Assessed) - Signed by Serena Croissant, MD on 09/04/2018   08/15/2018 Initial Diagnosis   Routine screening mammogram detected a 0.6cm mass in the right breast, 5cm from the nipple with no axillary adenopathy. Biopsy showed high grade DCIS, with insufficient tumor for receptor studies.   09/21/2018 Surgery   Right lumpectomy on 09/20/18 Ezzard Standing) [id number]): intermediate to high grade DCIS with a 0.4cm focus of invasive carcinoma, lymphovascular invasion present, clear margins. ER negative, PR negative, HER-2 positive, Ki67 40%  10/05/2018 lymph node biopsy: 2 lymph nodes negative for carcinoma   09/21/2018 Cancer Staging   Staging form: Breast, AJCC 8th Edition - Pathologic stage from 09/21/2018: Stage IA (pT1a, pN0, cM0, G1, ER-, PR-, HER2+) - Signed by Loa Socks, NP on 10/10/2018   11/05/2018 - 12/03/2018 Radiation Therapy   The patient initially received a dose of 40.05 Gy in 15 fractions to the breast using whole-breast tangent fields. This was delivered using a 3-D conformal technique. The pt received a boost delivering an additional 10 Gy in 5 fractions using a electron boost with electrons. The total dose was 50.05 Gy.     CHIEF COMPLIANT: Surveillance  INTERVAL HISTORY: Yvonne Maynard is a 82 year old with above-mentioned history of breast cancer who is currently on surveillance and  comes in annually for follow-ups    ALLERGIES:  has No Known Allergies.  MEDICATIONS:  Current Outpatient Medications  Medication Sig Dispense Refill   albuterol (VENTOLIN HFA) 108 (90 Base) MCG/ACT inhaler Inhale 2 puffs into the lungs 2 (two) times daily as needed for wheezing or shortness of breath. 18 g 0   aspirin EC 81 MG tablet Take 81 mg by mouth at bedtime.     azelastine (ASTELIN) 0.1 % nasal spray Place 2 sprays into both nostrils 2 (two) times daily. 30 mL 12   benzonatate (TESSALON) 100 MG capsule Take 1 capsule (100 mg total) by mouth 3 (three) times daily as needed for cough. 30 capsule 3   Calcium Carbonate-Vit D-Min (CALTRATE 600+D PLUS MINERALS) 600-800 MG-UNIT CHEW      cyanocobalamin (VITAMIN B12) 1000 MCG tablet Take by mouth.     cycloSPORINE (RESTASIS) 0.05 % ophthalmic emulsion Place 1 drop into both eyes 2 (two) times daily. 0.4 mL    hydroxyurea (HYDREA) 500 MG capsule Take 500 mg (1 capsule) daily Monday through Friday. May take with food to minimize GI side effects. 25 capsule 6   montelukast (SINGULAIR) 10 MG tablet TAKE ONE TABLET BY MOUTH ONCE DAILY. 90 tablet 0   Multiple Vitamins-Minerals (CENTRUM SILVER 50+WOMEN PO)      Multiple Vitamins-Minerals (PRESERVISION AREDS 2 PO) Take by mouth 2 (two) times daily.     Omega-3 Fatty Acids (FISH OIL) 1000 MG CAPS Take 1,000 mg by mouth daily.     OVER THE COUNTER MEDICATION chlorimaletae 4mg  one every 4 hours prn allergies     OVER THE COUNTER MEDICATION Cherry juice and capsules  No current facility-administered medications for this visit.    PHYSICAL EXAMINATION: ECOG PERFORMANCE STATUS: {CHL ONC ECOG PS:[id number]}  There were no vitals filed for this visit. There were no vitals filed for this visit.  BREAST:*** No palpable masses or nodules in either right or left breasts. No palpable axillary supraclavicular or infraclavicular adenopathy no breast tenderness or nipple discharge. (exam performed in the  presence of a chaperone)  LABORATORY DATA:  I have reviewed the data as listed    Latest Ref Rng & Units 07/29/2022   12:19 PM 03/18/2022    9:14 AM 11/16/2021    9:00 AM  CMP  Glucose 70 - 99 mg/dL 99  96  96   BUN 8 - 23 mg/dL 13  14  15    Creatinine 0.44 - 1.00 mg/dL 1.61  0.96  0.45   Sodium 135 - 145 mmol/L 135  138  138   Potassium 3.5 - 5.1 mmol/L 4.0  4.0  3.9   Chloride 98 - 111 mmol/L 104  105  107   CO2 22 - 32 mmol/L 25  27  25    Calcium 8.9 - 10.3 mg/dL 8.9  8.7  9.0   Total Protein 6.5 - 8.1 g/dL 6.5  6.5  6.4   Total Bilirubin 0.3 - 1.2 mg/dL 0.7  0.6  0.8   Alkaline Phos 38 - 126 U/L 64  66  62   AST 15 - 41 U/L 22  20  20    ALT 0 - 44 U/L 26  22  20      Lab Results  Component Value Date   WBC 5.9 07/29/2022   HGB 13.7 07/29/2022   HCT 40.5 07/29/2022   MCV 102.3 (H) 07/29/2022   PLT 244 07/29/2022   NEUTROABS 3.9 07/29/2022    ASSESSMENT & PLAN:  No problem-specific Assessment & Plan notes found for this encounter.    No orders of the defined types were placed in this encounter.  The patient has a good understanding of the overall plan. she agrees with it. she will call with any problems that may develop before the next visit here. Total time spent: 30 mins including face to face time and time spent for planning, charting and co-ordination of care   Sherlyn Lick, CMA 09/26/22    I Janan Ridge am acting as a Neurosurgeon for [employer]  ***

## 2022-09-29 ENCOUNTER — Ambulatory Visit (INDEPENDENT_AMBULATORY_CARE_PROVIDER_SITE_OTHER): Payer: PPO | Admitting: Nurse Practitioner

## 2022-09-29 ENCOUNTER — Inpatient Hospital Stay: Payer: PPO | Attending: Physician Assistant | Admitting: Hematology and Oncology

## 2022-09-29 ENCOUNTER — Encounter: Payer: Self-pay | Admitting: Nurse Practitioner

## 2022-09-29 VITALS — BP 123/61 | HR 86 | Temp 99.0°F | Ht 63.0 in | Wt 134.4 lb

## 2022-09-29 VITALS — BP 138/56 | HR 71 | Temp 97.8°F | Resp 18 | Ht 63.0 in | Wt 134.2 lb

## 2022-09-29 DIAGNOSIS — K219 Gastro-esophageal reflux disease without esophagitis: Secondary | ICD-10-CM | POA: Diagnosis not present

## 2022-09-29 DIAGNOSIS — Z08 Encounter for follow-up examination after completed treatment for malignant neoplasm: Secondary | ICD-10-CM | POA: Diagnosis not present

## 2022-09-29 DIAGNOSIS — C50411 Malignant neoplasm of upper-outer quadrant of right female breast: Secondary | ICD-10-CM

## 2022-09-29 DIAGNOSIS — Z923 Personal history of irradiation: Secondary | ICD-10-CM | POA: Diagnosis not present

## 2022-09-29 DIAGNOSIS — J300 Vasomotor rhinitis: Secondary | ICD-10-CM

## 2022-09-29 DIAGNOSIS — Z171 Estrogen receptor negative status [ER-]: Secondary | ICD-10-CM

## 2022-09-29 DIAGNOSIS — Z853 Personal history of malignant neoplasm of breast: Secondary | ICD-10-CM | POA: Insufficient documentation

## 2022-09-29 DIAGNOSIS — R053 Chronic cough: Secondary | ICD-10-CM

## 2022-09-29 MED ORDER — FLUTICASONE-SALMETEROL 100-50 MCG/ACT IN AEPB: 1.0000 | INHALATION_SPRAY | Freq: Two times a day (BID) | RESPIRATORY_TRACT | 2 refills | Status: DC

## 2022-09-29 NOTE — Assessment & Plan Note (Signed)
09/20/2018:Right lumpectomy on 09/20/18 Yvonne Maynard): intermediate to high grade DCIS with a 0.4cm focus of invasive carcinoma, lymphovascular invasion present, clear margins. Stage Ia: ER/PR negative HER-2 positive 10/05/2018: Sentinel lymph node biopsy: 0/2 lymph nodes negative 11/06/2018- 12/03/2018: Adjuvant radiation   Treatment plan: No role of adjuvant systemic chemotherapy or antiestrogen therapy because of the size of the tumor and the fact that it is estrogen receptor negative.   Breast cancer surveillance: 1. Breast exam   06/10/2021 benign 2. Mammogram 09/05/2022: Benign at [facility], density category B 3.  Bone density 09/05/2022: T-score -2.6 (used to be -2.1): Osteoporosis: Recommended bisphosphonate therapy along with calcium and vitamin D    Return to clinic in 1 year for follow-up

## 2022-09-29 NOTE — Progress Notes (Signed)
Subjective:    Patient ID: Yvonne Maynard, female    DOB: 1940-08-30, 82 y.o.   MRN: [id number]  HPI Pt comes in today for cough, pt states cough has been going on since she had covid last December, uncontrollable cough comes at random timing.  Presents for recheck of chronic cough that has been going on for years.  Noticed a flareup last December after she had COVID.  States that it comes and goes.  Has not identified any specific triggers other than having a cold.  No chest pain or tightness.  Slight shortness of breath only with prolonged coughing.  Sore throat with prolonged cough.  Some left ear pain especially at night.  No fevers.  Cough seems to be worse at night and first thing in the morning.  The only time the cough was completely under control was when she had a steroid shot for a rash a long time ago.  States her GERD has been under control.  Limited caffeine intake.  Denies alcohol tobacco or NSAID use.       Objective:   Physical Exam NAD.  Alert, oriented.  Right TM mildly retracted, no erythema.  Left TM significantly retracted, no erythema.  Nasal mucosa clear.  Pharynx minimally injected with cloudy PND noted.  Mucous membranes moist.  Neck supple with mild soft anterior adenopathy.  Lungs clear.  No tachypnea.  Heart regular rate rhythm.  Abdomen soft nondistended with mild upper epigastric area discomfort to deep palpation.  No rebound or guarding.  No obvious masses. Today's Vitals   09/29/22 1613  BP: 123/61  Pulse: 86  Temp: 99 F (37.2 C)  SpO2: 96%  Weight: 134 lb 6.4 oz (61 kg)  Height: 5\' 3"  (1.6 m)   Body mass index is 23.81 kg/m.  See last chest x-ray 08/29/2019.      Assessment & Plan:   Problem List Items Addressed This Visit       Respiratory   Vasomotor rhinitis     Digestive   Gastroesophageal reflux disease without esophagitis   Other Visit Diagnoses     Chronic cough    -  Primary   Relevant Medications   fluticasone-salmeterol  (WIXELA INHUB) 100-50 MCG/ACT AEPB   Other Relevant Orders   DG Chest 2 View      Start famotidine OTC as directed for possible GERD based on exam and history.  Feel this could be adding to her cough especially at night. Continue Astelin nasal spray and Singulair. Chest x-ray ordered. Feels some of the cough may be exacerbated by weather change and fall allergy season. Since steroids seem to be the best treatment for her cough, start Wixela inhaler as directed.  Rinse out mouth after use to avoid thrush.  Call back in 2 to 3 weeks if cough has not significantly improved, recommend pulmonology referral at that time. Meds ordered this encounter  Medications   fluticasone-salmeterol (WIXELA INHUB) 100-50 MCG/ACT AEPB    Sig: Inhale 1 puff into the lungs 2 (two) times daily.    Dispense:  60 each    Refill:  2    Order Specific Question:   Supervising Provider    Answer:   Lilyan Punt A [[id number]]

## 2022-09-29 NOTE — Patient Instructions (Signed)
Famotidine daily

## 2022-10-03 ENCOUNTER — Ambulatory Visit (HOSPITAL_COMMUNITY)
Admission: RE | Admit: 2022-10-03 | Discharge: 2022-10-03 | Disposition: A | Discharge status: Home / Self Care | Payer: PPO | Source: Ambulatory Visit | Attending: Nurse Practitioner | Admitting: Nurse Practitioner

## 2022-10-03 DIAGNOSIS — R053 Chronic cough: Secondary | ICD-10-CM | POA: Diagnosis not present

## 2022-10-03 DIAGNOSIS — J449 Chronic obstructive pulmonary disease, unspecified: Secondary | ICD-10-CM | POA: Diagnosis not present

## 2022-10-03 DIAGNOSIS — I700 Atherosclerosis of aorta: Secondary | ICD-10-CM | POA: Diagnosis not present

## 2022-10-06 DIAGNOSIS — C44722 Squamous cell carcinoma of skin of right lower limb, including hip: Secondary | ICD-10-CM | POA: Diagnosis not present

## 2022-10-06 DIAGNOSIS — Z85828 Personal history of other malignant neoplasm of skin: Secondary | ICD-10-CM | POA: Diagnosis not present

## 2022-10-06 DIAGNOSIS — Z08 Encounter for follow-up examination after completed treatment for malignant neoplasm: Secondary | ICD-10-CM | POA: Diagnosis not present

## 2022-10-06 DIAGNOSIS — C44712 Basal cell carcinoma of skin of right lower limb, including hip: Secondary | ICD-10-CM | POA: Diagnosis not present

## 2022-10-13 DIAGNOSIS — H353211 Exudative age-related macular degeneration, right eye, with active choroidal neovascularization: Secondary | ICD-10-CM | POA: Diagnosis not present

## 2022-10-13 DIAGNOSIS — H43813 Vitreous degeneration, bilateral: Secondary | ICD-10-CM | POA: Diagnosis not present

## 2022-10-13 DIAGNOSIS — H353121 Nonexudative age-related macular degeneration, left eye, early dry stage: Secondary | ICD-10-CM | POA: Diagnosis not present

## 2022-10-27 ENCOUNTER — Other Ambulatory Visit: Payer: Self-pay | Admitting: Family Medicine

## 2022-11-10 DIAGNOSIS — Z85828 Personal history of other malignant neoplasm of skin: Secondary | ICD-10-CM | POA: Diagnosis not present

## 2022-11-10 DIAGNOSIS — Z08 Encounter for follow-up examination after completed treatment for malignant neoplasm: Secondary | ICD-10-CM | POA: Diagnosis not present

## 2022-11-17 DIAGNOSIS — H353211 Exudative age-related macular degeneration, right eye, with active choroidal neovascularization: Secondary | ICD-10-CM | POA: Diagnosis not present

## 2022-11-17 DIAGNOSIS — H43813 Vitreous degeneration, bilateral: Secondary | ICD-10-CM | POA: Diagnosis not present

## 2022-11-17 DIAGNOSIS — H353121 Nonexudative age-related macular degeneration, left eye, early dry stage: Secondary | ICD-10-CM | POA: Diagnosis not present

## 2022-12-14 ENCOUNTER — Other Ambulatory Visit: Payer: Self-pay | Admitting: Nurse Practitioner

## 2022-12-14 DIAGNOSIS — R053 Chronic cough: Secondary | ICD-10-CM

## 2022-12-20 ENCOUNTER — Inpatient Hospital Stay: Payer: PPO | Attending: Physician Assistant

## 2022-12-20 DIAGNOSIS — D471 Chronic myeloproliferative disease: Secondary | ICD-10-CM

## 2022-12-20 DIAGNOSIS — D473 Essential (hemorrhagic) thrombocythemia: Secondary | ICD-10-CM | POA: Diagnosis not present

## 2022-12-20 DIAGNOSIS — Z1589 Genetic susceptibility to other disease: Secondary | ICD-10-CM

## 2022-12-20 LAB — CBC WITH DIFFERENTIAL/PLATELET
Abs Immature Granulocytes: 0.04 10*3/uL (ref 0.00–0.07)
Basophils Absolute: 0.1 10*3/uL (ref 0.0–0.1)
Basophils Relative: 2 %
Eosinophils Absolute: 0.2 10*3/uL (ref 0.0–0.5)
Eosinophils Relative: 2 %
HCT: 46.2 % — ABNORMAL HIGH (ref 36.0–46.0)
Hemoglobin: 15.3 g/dL — ABNORMAL HIGH (ref 12.0–15.0)
Immature Granulocytes: 1 %
Lymphocytes Relative: 21 %
Lymphs Abs: 1.5 10*3/uL (ref 0.7–4.0)
MCH: 34.7 pg — ABNORMAL HIGH (ref 26.0–34.0)
MCHC: 33.1 g/dL (ref 30.0–36.0)
MCV: 104.8 fL — ABNORMAL HIGH (ref 80.0–100.0)
Monocytes Absolute: 0.4 10*3/uL (ref 0.1–1.0)
Monocytes Relative: 6 %
Neutro Abs: 4.7 10*3/uL (ref 1.7–7.7)
Neutrophils Relative %: 68 %
Platelets: 338 10*3/uL (ref 150–400)
RBC: 4.41 MIL/uL (ref 3.87–5.11)
RDW: 14.6 % (ref 11.5–15.5)
WBC: 6.9 10*3/uL (ref 4.0–10.5)
nRBC: 0 % (ref 0.0–0.2)

## 2022-12-20 LAB — COMPREHENSIVE METABOLIC PANEL
ALT: 32 U/L (ref 0–44)
AST: 23 U/L (ref 15–41)
Albumin: 4.2 g/dL (ref 3.5–5.0)
Alkaline Phosphatase: 75 U/L (ref 38–126)
Anion gap: 9 (ref 5–15)
BUN: 14 mg/dL (ref 8–23)
CO2: 27 mmol/L (ref 22–32)
Calcium: 9.6 mg/dL (ref 8.9–10.3)
Chloride: 104 mmol/L (ref 98–111)
Creatinine, Ser: 0.88 mg/dL (ref 0.44–1.00)
GFR, Estimated: >60 mL/min (ref >60)
Glucose, Bld: 104 mg/dL — ABNORMAL HIGH (ref 70–99)
Potassium: 4.4 mmol/L (ref 3.5–5.1)
Sodium: 140 mmol/L (ref 135–145)
Total Bilirubin: 0.6 mg/dL (ref <1.2)
Total Protein: 7.1 g/dL (ref 6.5–8.1)

## 2022-12-20 LAB — LACTATE DEHYDROGENASE: LDH: 156 U/L (ref 98–192)
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
## 2022-12-26 ENCOUNTER — Inpatient Hospital Stay: Payer: PPO | Admitting: Physician Assistant

## 2022-12-26 VITALS — BP 113/67 | HR 73 | Temp 97.9°F | Resp 16 | Wt 134.9 lb

## 2022-12-26 DIAGNOSIS — D45 Polycythemia vera: Secondary | ICD-10-CM | POA: Diagnosis not present

## 2022-12-26 DIAGNOSIS — D473 Essential (hemorrhagic) thrombocythemia: Secondary | ICD-10-CM | POA: Diagnosis not present

## 2022-12-26 DIAGNOSIS — D471 Chronic myeloproliferative disease: Secondary | ICD-10-CM

## 2022-12-26 DIAGNOSIS — Z1589 Genetic susceptibility to other disease: Secondary | ICD-10-CM

## 2022-12-26 MED ORDER — HYDROXYUREA 500 MG PO CAPS: 500.0000 mg | ORAL_CAPSULE | Freq: Every day | ORAL | 5 refills | Status: DC

## 2022-12-26 NOTE — Patient Instructions (Addendum)
[facility] at [facility] Discharge Instructions  You were seen today by Rojelio Brenner PA-C for your follow-up visit to discuss your polycythemia.  Your platelet levels look great!  However, your hemoglobin (red blood cells) is higher than it should be, so we will increase your Hydrea (hydroxyurea) that you are taking 1 tablet (500 mg) each day of the week (instead of just Monday through Friday).  We will recheck your blood count once a month for the next 2 months.  We will check an ultrasound of your spleen to see if it is enlarged.  This can happen sometimes in polycythemia.  FOLLOW-UP APPOINTMENT: Same-day labs and office visit in 4 months.  However, if you have any changes or concerns before that time, do not hesitate to reach out and request an earlier appointment if needed.  - - - - - - - - - - - - - - - - -  Thank you for choosing [facility] at [facility] to provide your oncology and hematology care.  To afford each patient quality time with our provider, please arrive at least 15 minutes before your scheduled appointment time.   If you have a lab appointment with the Cancer Center please come in thru the Main Entrance and check in at the main information desk.  You need to re-schedule your appointment should you arrive 10 or more minutes late.  We strive to give you quality time with our providers, and arriving late affects you and other patients whose appointments are after yours.  Also, if you no show three or more times for appointments you may be dismissed from the clinic at the providers discretion.     Again, thank you for choosing [facility]  Our hope is that these requests will decrease the amount of time that you wait before being seen by our physicians.       _____________________________________________________________  Should you have questions after your visit to [facility], please contact our  office at [phone] and follow the prompts.  Our office hours are 8:00 a.m. and 4:30 p.m. Monday - Friday.  Please note that voicemails left after 4:00 p.m. may not be returned until the following business day.  We are closed weekends and major holidays.  You do have access to a nurse 24-7, just call the main number to the clinic [phone] and do not press any options, hold on the line and a nurse will answer the phone.    For prescription refill requests, have your pharmacy contact our office and allow 72 hours.    Due to Covid, you will need to wear a mask upon entering the hospital. If you do not have a mask, a mask will be given to you at the Main Entrance upon arrival. For doctor visits, patients may have 1 support person age 81 or older with them. For treatment visits, patients can not have anyone with them due to social distancing guidelines and our immunocompromised population.

## 2022-12-27 ENCOUNTER — Encounter: Payer: Self-pay | Admitting: Physician Assistant

## 2022-12-29 DIAGNOSIS — H353121 Nonexudative age-related macular degeneration, left eye, early dry stage: Secondary | ICD-10-CM | POA: Diagnosis not present

## 2022-12-29 DIAGNOSIS — H43813 Vitreous degeneration, bilateral: Secondary | ICD-10-CM | POA: Diagnosis not present

## 2022-12-29 DIAGNOSIS — H353211 Exudative age-related macular degeneration, right eye, with active choroidal neovascularization: Secondary | ICD-10-CM | POA: Diagnosis not present

## 2023-01-23 ENCOUNTER — Inpatient Hospital Stay: Payer: HMO | Attending: Physician Assistant

## 2023-01-23 ENCOUNTER — Encounter: Payer: Self-pay | Admitting: Physician Assistant

## 2023-01-23 ENCOUNTER — Ambulatory Visit (HOSPITAL_COMMUNITY)
Admission: RE | Admit: 2023-01-23 | Discharge: 2023-01-23 | Disposition: A | Discharge status: Home / Self Care | Payer: HMO | Source: Ambulatory Visit | Attending: Physician Assistant | Admitting: Physician Assistant

## 2023-01-23 DIAGNOSIS — D45 Polycythemia vera: Secondary | ICD-10-CM

## 2023-01-23 DIAGNOSIS — Z1589 Genetic susceptibility to other disease: Secondary | ICD-10-CM | POA: Diagnosis not present

## 2023-01-23 DIAGNOSIS — D473 Essential (hemorrhagic) thrombocythemia: Secondary | ICD-10-CM | POA: Diagnosis not present

## 2023-01-23 DIAGNOSIS — D471 Chronic myeloproliferative disease: Secondary | ICD-10-CM | POA: Insufficient documentation

## 2023-01-23 DIAGNOSIS — N281 Cyst of kidney, acquired: Secondary | ICD-10-CM | POA: Diagnosis not present

## 2023-01-23 DIAGNOSIS — R161 Splenomegaly, not elsewhere classified: Secondary | ICD-10-CM | POA: Diagnosis not present

## 2023-01-23 LAB — CBC WITH DIFFERENTIAL/PLATELET
Abs Immature Granulocytes: 0.03 10*3/uL (ref 0.00–0.07)
Basophils Absolute: 0.1 10*3/uL (ref 0.0–0.1)
Basophils Relative: 2 %
Eosinophils Absolute: 0.2 10*3/uL (ref 0.0–0.5)
Eosinophils Relative: 3 %
HCT: 43.8 % (ref 36.0–46.0)
Hemoglobin: 14.2 g/dL (ref 12.0–15.0)
Immature Granulocytes: 0 %
Lymphocytes Relative: 22 %
Lymphs Abs: 1.5 10*3/uL (ref 0.7–4.0)
MCH: 34.2 pg — ABNORMAL HIGH (ref 26.0–34.0)
MCHC: 32.4 g/dL (ref 30.0–36.0)
MCV: 105.5 fL — ABNORMAL HIGH (ref 80.0–100.0)
Monocytes Absolute: 0.4 10*3/uL (ref 0.1–1.0)
Monocytes Relative: 6 %
Neutro Abs: 4.5 10*3/uL (ref 1.7–7.7)
Neutrophils Relative %: 67 %
Platelets: 287 10*3/uL (ref 150–400)
RBC: 4.15 MIL/uL (ref 3.87–5.11)
RDW: 14.8 % (ref 11.5–15.5)
WBC: 6.8 10*3/uL (ref 4.0–10.5)
nRBC: 0 % (ref 0.0–0.2)

## 2023-01-24 ENCOUNTER — Inpatient Hospital Stay: Payer: HMO

## 2023-01-25 ENCOUNTER — Encounter: Payer: Self-pay | Admitting: Physician Assistant

## 2023-01-25 ENCOUNTER — Other Ambulatory Visit: Payer: Self-pay | Admitting: Family Medicine

## 2023-02-20 ENCOUNTER — Inpatient Hospital Stay: Payer: HMO | Attending: Physician Assistant

## 2023-02-20 DIAGNOSIS — D473 Essential (hemorrhagic) thrombocythemia: Secondary | ICD-10-CM | POA: Insufficient documentation

## 2023-02-20 DIAGNOSIS — D45 Polycythemia vera: Secondary | ICD-10-CM

## 2023-02-20 DIAGNOSIS — D471 Chronic myeloproliferative disease: Secondary | ICD-10-CM

## 2023-02-20 DIAGNOSIS — Z1589 Genetic susceptibility to other disease: Secondary | ICD-10-CM

## 2023-02-20 LAB — CBC WITH DIFFERENTIAL/PLATELET
Abs Immature Granulocytes: 0.03 10*3/uL (ref 0.00–0.07)
Basophils Absolute: 0.1 10*3/uL (ref 0.0–0.1)
Basophils Relative: 2 %
Eosinophils Absolute: 0.2 10*3/uL (ref 0.0–0.5)
Eosinophils Relative: 2 %
HCT: 43.1 % (ref 36.0–46.0)
Hemoglobin: 14 g/dL (ref 12.0–15.0)
Immature Granulocytes: 0 %
Lymphocytes Relative: 25 %
Lymphs Abs: 1.8 10*3/uL (ref 0.7–4.0)
MCH: 34.6 pg — ABNORMAL HIGH (ref 26.0–34.0)
MCHC: 32.5 g/dL (ref 30.0–36.0)
MCV: 106.4 fL — ABNORMAL HIGH (ref 80.0–100.0)
Monocytes Absolute: 0.5 10*3/uL (ref 0.1–1.0)
Monocytes Relative: 7 %
Neutro Abs: 4.7 10*3/uL (ref 1.7–7.7)
Neutrophils Relative %: 64 %
Platelets: 311 10*3/uL (ref 150–400)
RBC: 4.05 MIL/uL (ref 3.87–5.11)
RDW: 14.7 % (ref 11.5–15.5)
WBC: 7.2 10*3/uL (ref 4.0–10.5)
nRBC: 0 % (ref 0.0–0.2)

## 2023-02-22 ENCOUNTER — Inpatient Hospital Stay: Payer: HMO

## 2023-02-23 DIAGNOSIS — H353121 Nonexudative age-related macular degeneration, left eye, early dry stage: Secondary | ICD-10-CM | POA: Diagnosis not present

## 2023-02-23 DIAGNOSIS — H43813 Vitreous degeneration, bilateral: Secondary | ICD-10-CM | POA: Diagnosis not present

## 2023-02-23 DIAGNOSIS — H353211 Exudative age-related macular degeneration, right eye, with active choroidal neovascularization: Secondary | ICD-10-CM | POA: Diagnosis not present

## 2023-03-02 DIAGNOSIS — Z1283 Encounter for screening for malignant neoplasm of skin: Secondary | ICD-10-CM | POA: Diagnosis not present

## 2023-03-02 DIAGNOSIS — L570 Actinic keratosis: Secondary | ICD-10-CM | POA: Diagnosis not present

## 2023-03-02 DIAGNOSIS — D225 Melanocytic nevi of trunk: Secondary | ICD-10-CM | POA: Diagnosis not present

## 2023-03-02 DIAGNOSIS — X32XXXD Exposure to sunlight, subsequent encounter: Secondary | ICD-10-CM | POA: Diagnosis not present

## 2023-03-22 ENCOUNTER — Other Ambulatory Visit: Payer: Self-pay | Admitting: Nurse Practitioner

## 2023-03-22 DIAGNOSIS — R053 Chronic cough: Secondary | ICD-10-CM

## 2023-03-23 DIAGNOSIS — H524 Presbyopia: Secondary | ICD-10-CM | POA: Diagnosis not present

## 2023-03-23 DIAGNOSIS — H353211 Exudative age-related macular degeneration, right eye, with active choroidal neovascularization: Secondary | ICD-10-CM | POA: Diagnosis not present

## 2023-03-23 DIAGNOSIS — H26492 Other secondary cataract, left eye: Secondary | ICD-10-CM | POA: Diagnosis not present

## 2023-03-23 DIAGNOSIS — H5213 Myopia, bilateral: Secondary | ICD-10-CM | POA: Diagnosis not present

## 2023-03-23 DIAGNOSIS — H52203 Unspecified astigmatism, bilateral: Secondary | ICD-10-CM | POA: Diagnosis not present

## 2023-03-23 DIAGNOSIS — H02821 Cysts of right upper eyelid: Secondary | ICD-10-CM | POA: Diagnosis not present

## 2023-03-23 DIAGNOSIS — H04123 Dry eye syndrome of bilateral lacrimal glands: Secondary | ICD-10-CM | POA: Diagnosis not present

## 2023-03-27 ENCOUNTER — Inpatient Hospital Stay: Payer: HMO

## 2023-03-27 ENCOUNTER — Inpatient Hospital Stay: Payer: HMO | Admitting: Physician Assistant

## 2023-04-18 ENCOUNTER — Other Ambulatory Visit: Payer: Self-pay | Admitting: Nurse Practitioner

## 2023-04-18 DIAGNOSIS — R7301 Impaired fasting glucose: Secondary | ICD-10-CM | POA: Diagnosis not present

## 2023-04-18 DIAGNOSIS — E876 Hypokalemia: Secondary | ICD-10-CM | POA: Diagnosis not present

## 2023-04-19 LAB — COMPREHENSIVE METABOLIC PANEL WITH GFR
ALT: 21 IU/L (ref 0–32)
AST: 19 IU/L (ref 0–40)
Albumin: 4.4 g/dL (ref 3.7–4.7)
Alkaline Phosphatase: 97 IU/L (ref 44–121)
BUN/Creatinine Ratio: 14 (ref 12–28)
BUN: 11 mg/dL (ref 8–27)
Bilirubin Total: 0.3 mg/dL (ref 0.0–1.2)
CO2: 24 mmol/L (ref 20–29)
Calcium: 9.6 mg/dL (ref 8.7–10.3)
Chloride: 105 mmol/L (ref 96–106)
Creatinine, Ser: 0.81 mg/dL (ref 0.57–1.00)
Globulin, Total: 2.6 g/dL (ref 1.5–4.5)
Glucose: 95 mg/dL (ref 70–99)
Potassium: 4.9 mmol/L (ref 3.5–5.2)
Sodium: 142 mmol/L (ref 134–144)
Total Protein: 7 g/dL (ref 6.0–8.5)
eGFR: 72 mL/min/{1.73_m2} (ref >59)

## 2023-04-19 LAB — LIPID PANEL
Chol/HDL Ratio: 4 ratio (ref 0.0–4.4)
Cholesterol, Total: 166 mg/dL (ref 100–199)
HDL: 42 mg/dL (ref >39)
LDL Chol Calc (NIH): 98 mg/dL (ref 0–99)
Triglycerides: 149 mg/dL (ref 0–149)
VLDL Cholesterol Cal: 26 mg/dL (ref 5–40)

## 2023-04-19 LAB — HEMOGLOBIN A1C
Est. average glucose Bld gHb Est-mCnc: 108 mg/dL
Hgb A1c MFr Bld: 5.4 % (ref 4.8–5.6)

## 2023-04-20 ENCOUNTER — Other Ambulatory Visit: Payer: Self-pay | Admitting: Family Medicine

## 2023-04-20 DIAGNOSIS — H353211 Exudative age-related macular degeneration, right eye, with active choroidal neovascularization: Secondary | ICD-10-CM | POA: Diagnosis not present

## 2023-04-20 DIAGNOSIS — H353121 Nonexudative age-related macular degeneration, left eye, early dry stage: Secondary | ICD-10-CM | POA: Diagnosis not present

## 2023-04-20 DIAGNOSIS — H43813 Vitreous degeneration, bilateral: Secondary | ICD-10-CM | POA: Diagnosis not present

## 2023-04-28 ENCOUNTER — Ambulatory Visit: Payer: PPO | Admitting: Nurse Practitioner

## 2023-04-28 ENCOUNTER — Other Ambulatory Visit: Payer: Self-pay

## 2023-04-28 ENCOUNTER — Encounter: Payer: Self-pay | Admitting: Nurse Practitioner

## 2023-04-28 VITALS — BP 118/68 | HR 85 | Temp 97.2°F | Ht 63.0 in | Wt 136.0 lb

## 2023-04-28 DIAGNOSIS — Z0000 Encounter for general adult medical examination without abnormal findings: Secondary | ICD-10-CM

## 2023-04-28 DIAGNOSIS — Z0001 Encounter for general adult medical examination with abnormal findings: Secondary | ICD-10-CM | POA: Diagnosis not present

## 2023-04-28 DIAGNOSIS — M810 Age-related osteoporosis without current pathological fracture: Secondary | ICD-10-CM | POA: Diagnosis not present

## 2023-04-28 DIAGNOSIS — D473 Essential (hemorrhagic) thrombocythemia: Secondary | ICD-10-CM

## 2023-04-28 DIAGNOSIS — D45 Polycythemia vera: Secondary | ICD-10-CM

## 2023-04-28 DIAGNOSIS — Z171 Estrogen receptor negative status [ER-]: Secondary | ICD-10-CM

## 2023-04-28 MED ORDER — RISEDRONATE SODIUM 35 MG PO TABS: 35.0000 mg | ORAL_TABLET | ORAL | 3 refills | Status: AC
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
## 2023-05-01 ENCOUNTER — Inpatient Hospital Stay: Payer: HMO | Admitting: Physician Assistant

## 2023-05-01 ENCOUNTER — Inpatient Hospital Stay: Payer: HMO | Attending: Physician Assistant

## 2023-05-01 VITALS — BP 117/71 | HR 68 | Temp 97.3°F | Resp 18 | Ht 63.0 in | Wt 137.0 lb

## 2023-05-01 DIAGNOSIS — Z1589 Genetic susceptibility to other disease: Secondary | ICD-10-CM | POA: Diagnosis not present

## 2023-05-01 DIAGNOSIS — Z171 Estrogen receptor negative status [ER-]: Secondary | ICD-10-CM

## 2023-05-01 DIAGNOSIS — D473 Essential (hemorrhagic) thrombocythemia: Secondary | ICD-10-CM

## 2023-05-01 DIAGNOSIS — D45 Polycythemia vera: Secondary | ICD-10-CM

## 2023-05-01 DIAGNOSIS — D471 Chronic myeloproliferative disease: Secondary | ICD-10-CM

## 2023-05-01 DIAGNOSIS — C50411 Malignant neoplasm of upper-outer quadrant of right female breast: Secondary | ICD-10-CM | POA: Diagnosis not present

## 2023-05-01 LAB — COMPREHENSIVE METABOLIC PANEL WITH GFR
ALT: 23 U/L (ref 0–44)
AST: 20 U/L (ref 15–41)
Albumin: 4 g/dL (ref 3.5–5.0)
Alkaline Phosphatase: 77 U/L (ref 38–126)
Anion gap: 10 (ref 5–15)
BUN: 15 mg/dL (ref 8–23)
CO2: 26 mmol/L (ref 22–32)
Calcium: 9.3 mg/dL (ref 8.9–10.3)
Chloride: 104 mmol/L (ref 98–111)
Creatinine, Ser: 0.84 mg/dL (ref 0.44–1.00)
GFR, Estimated: >60 mL/min (ref >60)
Glucose, Bld: 99 mg/dL (ref 70–99)
Potassium: 4.1 mmol/L (ref 3.5–5.1)
Sodium: 140 mmol/L (ref 135–145)
Total Bilirubin: 0.4 mg/dL (ref 0.0–1.2)
Total Protein: 6.9 g/dL (ref 6.5–8.1)

## 2023-05-01 LAB — CBC WITH DIFFERENTIAL/PLATELET
Abs Immature Granulocytes: 0.02 10*3/uL (ref 0.00–0.07)
Basophils Absolute: 0.1 10*3/uL (ref 0.0–0.1)
Basophils Relative: 2 %
Eosinophils Absolute: 0.2 10*3/uL (ref 0.0–0.5)
Eosinophils Relative: 3 %
HCT: 44.4 % (ref 36.0–46.0)
Hemoglobin: 14.8 g/dL (ref 12.0–15.0)
Immature Granulocytes: 0 %
Lymphocytes Relative: 23 %
Lymphs Abs: 1.6 10*3/uL (ref 0.7–4.0)
MCH: 35.7 pg — ABNORMAL HIGH (ref 26.0–34.0)
MCHC: 33.3 g/dL (ref 30.0–36.0)
MCV: 107.2 fL — ABNORMAL HIGH (ref 80.0–100.0)
Monocytes Absolute: 0.4 10*3/uL (ref 0.1–1.0)
Monocytes Relative: 6 %
Neutro Abs: 4.5 10*3/uL (ref 1.7–7.7)
Neutrophils Relative %: 66 %
Platelets: 307 10*3/uL (ref 150–400)
RBC: 4.14 MIL/uL (ref 3.87–5.11)
RDW: 13.8 % (ref 11.5–15.5)
WBC: 6.8 10*3/uL (ref 4.0–10.5)
nRBC: 0 % (ref 0.0–0.2)

## 2023-05-01 LAB — LACTATE DEHYDROGENASE: LDH: 160 U/L (ref 98–192)

## 2023-05-01 NOTE — Patient Instructions (Signed)
 [facility] at [facility] Discharge Instructions  You were seen today by Sheril Dines PA-C for your follow-up visit to discuss your polycythemia.  Your platelet and hemoglobin/red blood cell levels look great!  Continue Hydrea (hydroxyurea) 1 tablet (500 mg) each day of the week.  We will recheck your blood count every 2 months.  FOLLOW-UP APPOINTMENT: Same-day labs and office visit in 4 months.  However, if you have any changes or concerns before that time, do not hesitate to reach out and request an earlier appointment if needed.  - - - - - - - - - - - - - - - - -  Thank you for choosing [facility] at [facility] to provide your oncology and hematology care.  To afford each patient quality time with our provider, please arrive at least 15 minutes before your scheduled appointment time.   If you have a lab appointment with the Cancer Center please come in thru the Main Entrance and check in at the main information desk.  You need to re-schedule your appointment should you arrive 10 or more minutes late.  We strive to give you quality time with our providers, and arriving late affects you and other patients whose appointments are after yours.  Also, if you no show three or more times for appointments you may be dismissed from the clinic at the providers discretion.     Again, thank you for choosing [facility].  Our hope is that these requests will decrease the amount of time that you wait before being seen by our physicians.       _____________________________________________________________  Should you have questions after your visit to [facility], please contact our office at [phone] and follow the prompts.  Our office hours are 8:00 a.m. and 4:30 p.m. Monday - Friday.  Please note that voicemails left after 4:00 p.m. may not be returned until the following business day.  We are closed weekends and major  holidays.  You do have access to a nurse 24-7, just call the main number to the clinic [phone] and do not press any options, hold on the line and a nurse will answer the phone.    For prescription refill requests, have your pharmacy contact our office and allow 72 hours.    Due to Covid, you will need to wear a mask upon entering the hospital. If you do not have a mask, a mask will be given to you at the Main Entrance upon arrival. For doctor visits, patients may have 1 support person age 19 or older with them. For treatment visits, patients can not have anyone with them due to social distancing guidelines and our immunocompromised population.

## 2023-06-22 DIAGNOSIS — H353211 Exudative age-related macular degeneration, right eye, with active choroidal neovascularization: Secondary | ICD-10-CM | POA: Diagnosis not present

## 2023-06-22 DIAGNOSIS — H353122 Nonexudative age-related macular degeneration, left eye, intermediate dry stage: Secondary | ICD-10-CM | POA: Diagnosis not present

## 2023-06-22 DIAGNOSIS — H43813 Vitreous degeneration, bilateral: Secondary | ICD-10-CM | POA: Diagnosis not present

## 2023-06-30 ENCOUNTER — Telehealth: Payer: Self-pay | Admitting: Family Medicine

## 2023-06-30 DIAGNOSIS — R053 Chronic cough: Secondary | ICD-10-CM

## 2023-06-30 MED ORDER — FLUTICASONE-SALMETEROL 100-50 MCG/ACT IN AEPB: INHALATION_SPRAY | RESPIRATORY_TRACT | 0 refills | Status: DC

## 2023-06-30 NOTE — Telephone Encounter (Signed)
 Refill on    fluticasone -salmeterol (ADVAIR) 100-50 MCG/ACT AEPB  Send to [facility]

## 2023-06-30 NOTE — Telephone Encounter (Signed)
Received via fax Rx request: Prescription sent electronically to pharmacy  

## 2023-07-03 ENCOUNTER — Inpatient Hospital Stay: Attending: Physician Assistant

## 2023-07-03 DIAGNOSIS — D473 Essential (hemorrhagic) thrombocythemia: Secondary | ICD-10-CM | POA: Insufficient documentation

## 2023-07-03 DIAGNOSIS — D45 Polycythemia vera: Secondary | ICD-10-CM

## 2023-07-03 LAB — CBC WITH DIFFERENTIAL/PLATELET
Abs Immature Granulocytes: 0.04 10*3/uL (ref 0.00–0.07)
Basophils Absolute: 0.1 10*3/uL (ref 0.0–0.1)
Basophils Relative: 2 %
Eosinophils Absolute: 0.2 10*3/uL (ref 0.0–0.5)
Eosinophils Relative: 2 %
HCT: 43.1 % (ref 36.0–46.0)
Hemoglobin: 14.1 g/dL (ref 12.0–15.0)
Immature Granulocytes: 1 %
Lymphocytes Relative: 21 %
Lymphs Abs: 1.4 10*3/uL (ref 0.7–4.0)
MCH: 35.3 pg — ABNORMAL HIGH (ref 26.0–34.0)
MCHC: 32.7 g/dL (ref 30.0–36.0)
MCV: 108 fL — ABNORMAL HIGH (ref 80.0–100.0)
Monocytes Absolute: 0.4 10*3/uL (ref 0.1–1.0)
Monocytes Relative: 6 %
Neutro Abs: 4.7 10*3/uL (ref 1.7–7.7)
Neutrophils Relative %: 68 %
Platelets: 276 10*3/uL (ref 150–400)
RBC: 3.99 MIL/uL (ref 3.87–5.11)
RDW: 13.6 % (ref 11.5–15.5)
WBC: 6.8 10*3/uL (ref 4.0–10.5)
nRBC: 0 % (ref 0.0–0.2)

## 2023-07-18 DIAGNOSIS — C50411 Malignant neoplasm of upper-outer quadrant of right female breast: Secondary | ICD-10-CM | POA: Diagnosis not present

## 2023-07-18 DIAGNOSIS — Z171 Estrogen receptor negative status [ER-]: Secondary | ICD-10-CM | POA: Diagnosis not present

## 2023-07-19 ENCOUNTER — Other Ambulatory Visit: Payer: Self-pay

## 2023-07-19 MED ORDER — MONTELUKAST SODIUM 10 MG PO TABS: 10.0000 mg | ORAL_TABLET | Freq: Every day | ORAL | 2 refills | Status: AC

## 2023-07-26 ENCOUNTER — Other Ambulatory Visit: Payer: Self-pay | Admitting: *Deleted

## 2023-07-26 DIAGNOSIS — D473 Essential (hemorrhagic) thrombocythemia: Secondary | ICD-10-CM

## 2023-07-26 DIAGNOSIS — Z1589 Genetic susceptibility to other disease: Secondary | ICD-10-CM

## 2023-07-26 MED ORDER — HYDROXYUREA 500 MG PO CAPS: 500.0000 mg | ORAL_CAPSULE | Freq: Every day | ORAL | 5 refills | Status: DC

## 2023-08-24 ENCOUNTER — Other Ambulatory Visit: Payer: Self-pay | Admitting: Family Medicine

## 2023-08-24 DIAGNOSIS — R053 Chronic cough: Secondary | ICD-10-CM

## 2023-08-24 MED ORDER — FLUTICASONE-SALMETEROL 100-50 MCG/ACT IN AEPB: INHALATION_SPRAY | RESPIRATORY_TRACT | 3 refills | Status: DC

## 2023-08-24 NOTE — Telephone Encounter (Signed)
 Copied from CRM #[id number]. Topic: Clinical - Medication Refill >> Aug 24, 2023 10:11 AM Rosaria E wrote: Medication: fluticasone -salmeterol (ADVAIR) 100-50 MCG/ACT AEPB  Has the patient contacted their pharmacy? Yes (Agent: If no, request that the patient contact the pharmacy for the refill. If patient does not wish to contact the pharmacy document the reason why and proceed with request.) (Agent: If yes, when and what did the pharmacy advise?)  This is the patient's preferred pharmacy:  [facility] [place], [place] - [id number] [place] [address] Phone: [phone] Fax: [phone]  Is this the correct pharmacy for this prescription? Yes If no, delete pharmacy and type the correct one.   Has the prescription been filled recently? Yes  Is the patient out of the medication? Yes, has been out for 2 weeks   Has the patient been seen for an appointment in the last year OR does the patient have an upcoming appointment? Yes  Can we respond through MyChart? Yes  Agent: Please be advised that Rx refills may take up to 3 business days. We ask that you follow-up with your pharmacy.

## 2023-09-07 DIAGNOSIS — H353211 Exudative age-related macular degeneration, right eye, with active choroidal neovascularization: Secondary | ICD-10-CM | POA: Diagnosis not present

## 2023-09-07 DIAGNOSIS — H353122 Nonexudative age-related macular degeneration, left eye, intermediate dry stage: Secondary | ICD-10-CM | POA: Diagnosis not present

## 2023-09-07 DIAGNOSIS — H43813 Vitreous degeneration, bilateral: Secondary | ICD-10-CM | POA: Diagnosis not present

## 2023-09-11 ENCOUNTER — Inpatient Hospital Stay

## 2023-09-11 ENCOUNTER — Inpatient Hospital Stay: Admitting: Physician Assistant

## 2023-09-11 DIAGNOSIS — Z1231 Encounter for screening mammogram for malignant neoplasm of breast: Secondary | ICD-10-CM | POA: Diagnosis not present

## 2023-09-11 LAB — HM MAMMOGRAPHY

## 2023-09-15 ENCOUNTER — Encounter: Payer: Self-pay | Admitting: Hematology and Oncology
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
## 2023-09-25 ENCOUNTER — Inpatient Hospital Stay (HOSPITAL_BASED_OUTPATIENT_CLINIC_OR_DEPARTMENT_OTHER): Admitting: Physician Assistant

## 2023-09-25 ENCOUNTER — Inpatient Hospital Stay: Attending: Physician Assistant

## 2023-09-25 DIAGNOSIS — D471 Chronic myeloproliferative disease: Secondary | ICD-10-CM

## 2023-09-25 DIAGNOSIS — Z853 Personal history of malignant neoplasm of breast: Secondary | ICD-10-CM | POA: Insufficient documentation

## 2023-09-25 DIAGNOSIS — D45 Polycythemia vera: Secondary | ICD-10-CM | POA: Diagnosis not present

## 2023-09-25 DIAGNOSIS — C50411 Malignant neoplasm of upper-outer quadrant of right female breast: Secondary | ICD-10-CM

## 2023-09-25 DIAGNOSIS — D473 Essential (hemorrhagic) thrombocythemia: Secondary | ICD-10-CM

## 2023-09-25 DIAGNOSIS — Z171 Estrogen receptor negative status [ER-]: Secondary | ICD-10-CM

## 2023-09-25 DIAGNOSIS — Z1589 Genetic susceptibility to other disease: Secondary | ICD-10-CM | POA: Diagnosis not present

## 2023-09-25 LAB — CBC WITH DIFFERENTIAL/PLATELET
Abs Immature Granulocytes: 0.03 K/uL (ref 0.00–0.07)
Basophils Absolute: 0.1 K/uL (ref 0.0–0.1)
Basophils Relative: 1 %
Eosinophils Absolute: 0.1 K/uL (ref 0.0–0.5)
Eosinophils Relative: 1 %
HCT: 43 % (ref 36.0–46.0)
Hemoglobin: 14.2 g/dL (ref 12.0–15.0)
Immature Granulocytes: 0 %
Lymphocytes Relative: 20 %
Lymphs Abs: 1.6 K/uL (ref 0.7–4.0)
MCH: 36.3 pg — ABNORMAL HIGH (ref 26.0–34.0)
MCHC: 33 g/dL (ref 30.0–36.0)
MCV: 110 fL — ABNORMAL HIGH (ref 80.0–100.0)
Monocytes Absolute: 0.5 K/uL (ref 0.1–1.0)
Monocytes Relative: 6 %
Neutro Abs: 5.5 K/uL (ref 1.7–7.7)
Neutrophils Relative %: 72 %
Platelets: 322 K/uL (ref 150–400)
RBC: 3.91 MIL/uL (ref 3.87–5.11)
RDW: 13.8 % (ref 11.5–15.5)
WBC: 7.7 K/uL (ref 4.0–10.5)
nRBC: 0 % (ref 0.0–0.2)

## 2023-09-25 LAB — COMPREHENSIVE METABOLIC PANEL WITH GFR
ALT: 22 U/L (ref 0–44)
AST: 19 U/L (ref 15–41)
Albumin: 3.8 g/dL (ref 3.5–5.0)
Alkaline Phosphatase: 77 U/L (ref 38–126)
Anion gap: 8 (ref 5–15)
BUN: 12 mg/dL (ref 8–23)
CO2: 25 mmol/L (ref 22–32)
Calcium: 9.2 mg/dL (ref 8.9–10.3)
Chloride: 107 mmol/L (ref 98–111)
Creatinine, Ser: 0.8 mg/dL (ref 0.44–1.00)
GFR, Estimated: >60 mL/min (ref >60)
Glucose, Bld: 101 mg/dL — ABNORMAL HIGH (ref 70–99)
Potassium: 4.1 mmol/L (ref 3.5–5.1)
Sodium: 140 mmol/L (ref 135–145)
Total Bilirubin: 0.8 mg/dL (ref 0.0–1.2)
Total Protein: 6.7 g/dL (ref 6.5–8.1)

## 2023-09-25 LAB — LACTATE DEHYDROGENASE: LDH: 165 U/L (ref 98–192)

## 2023-09-25 MED ORDER — HYDROXYUREA 500 MG PO CAPS: 500.0000 mg | ORAL_CAPSULE | Freq: Every day | ORAL | 1 refills | Status: AC

## 2023-09-25 NOTE — Patient Instructions (Signed)
 [facility] Discharge Instructions  You were seen today by Pleasant Barefoot PA-C for your follow-up visit to discuss your polycythemia.  Your platelet and hemoglobin/red blood cell levels look great!  Continue Hydrea  (hydroxyurea ) 1 tablet (500 mg) each day of the week.  Some of your symptoms are related to your polycythemia.  If these symptoms get worse, please let me know so that we can discuss treatment options. Itching after a shower Night sweats Bone pain and leg pain  Other symptoms (such as mouth sores and skin sores) can be related to your Hydrea .  Let me know if these occur.  We will recheck your blood count every 2 months.  FOLLOW-UP APPOINTMENT: Same-day labs and office visit in 4 months.  However, if you have any changes or concerns before that time, do not hesitate to reach out and request an earlier appointment if needed.   ** Thank you for trusting me with your healthcare!  I strive to provide all of my patients with quality care at each visit.  If you receive a survey for this visit, I would be so grateful to you for taking the time to provide feedback.  Thank you in advance!  ~ Hermann Dottavio                                        Dr. Mickiel Davonna Pleasant Barefoot, PA-C          Delon Hope, NP   - - - - - - - - - - - - - - - - - -    Thank you for choosing [facility] at [facility] to provide your oncology and hematology care.  To afford each patient quality time with our provider, please arrive at least 15 minutes before your scheduled appointment time.   If you have a lab appointment with the Cancer Center please come in thru the Main Entrance and check in at the main information desk.  You need to re-schedule your appointment should you arrive 10 or more minutes late.  We strive to give you quality time with our providers, and arriving late affects you and other patients whose appointments are  after yours.  Also, if you no show three or more times for appointments you may be dismissed from the clinic at the providers discretion.     Again, thank you for choosing [facility].  Our hope is that these requests will decrease the amount of time that you wait before being seen by our physicians.       _____________________________________________________________  Should you have questions after your visit to [facility], please contact our office at [phone] and follow the prompts.  Our office hours are 8:00 a.m. and 4:30 p.m. Monday - Friday.  Please note that voicemails left after 4:00 p.m. may not be returned until the following business day.  We are closed weekends and major holidays.  You do have access to a nurse 24-7, just call the main number to the clinic [phone] and do not press any options, hold on the line and a nurse will answer the phone.    For prescription refill requests, have your pharmacy contact our office and allow 72 hours.    Due to Covid, you will need to wear a  mask upon entering the hospital. If you do not have a mask, a mask will be given to you at the Main Entrance upon arrival. For doctor visits, patients may have 1 support person age 103 or older with them. For treatment visits, patients can not have anyone with them due to social distancing guidelines and our immunocompromised population.

## 2023-10-05 ENCOUNTER — Inpatient Hospital Stay (HOSPITAL_BASED_OUTPATIENT_CLINIC_OR_DEPARTMENT_OTHER): Payer: PPO | Admitting: Hematology and Oncology

## 2023-10-05 VITALS — BP 131/59 | HR 65 | Temp 97.6°F | Resp 17 | Wt 132.5 lb

## 2023-10-05 DIAGNOSIS — Z171 Estrogen receptor negative status [ER-]: Secondary | ICD-10-CM

## 2023-10-05 DIAGNOSIS — C50411 Malignant neoplasm of upper-outer quadrant of right female breast: Secondary | ICD-10-CM

## 2023-10-05 DIAGNOSIS — D473 Essential (hemorrhagic) thrombocythemia: Secondary | ICD-10-CM | POA: Diagnosis not present

## 2023-10-05 NOTE — Assessment & Plan Note (Signed)
 09/20/2018:Right lumpectomy on 09/20/18 [facility]): intermediate to high grade DCIS with a 0.4cm focus of invasive carcinoma, lymphovascular invasion present, clear margins. Stage Ia: ER/PR negative HER-2 positive 10/05/2018: Sentinel lymph node biopsy: 0/2 lymph nodes negative 11/06/2018- 12/03/2018: Adjuvant radiation   Treatment plan: No role of adjuvant systemic chemotherapy or antiestrogen therapy because of the size of the tumor and the fact that it is estrogen receptor negative.   Breast cancer surveillance: 1. Breast exam 10/05/2023 benign 2. Mammogram 09/11/2023: Benign at [facility], density category B 3.  Bone density 09/05/2022: T-score -2.6 (used to be -2.1): Osteoporosis: Recommended bisphosphonate therapy along with calcium and vitamin D 

## 2023-10-05 NOTE — Progress Notes (Signed)
 Patient Care Team: Alphonsa Glendia LABOR, MD as PCP - General (Family Medicine) Odean Potts, MD as Consulting Physician (Hematology and Oncology) Izell Domino, MD as Attending Physician (Radiation Oncology) Vernetta Berg, MD as Consulting Physician (General Surgery)  DIAGNOSIS:  Encounter Diagnosis  Name Primary?   Malignant neoplasm of upper-outer quadrant of right breast in female, estrogen receptor negative (HCC) Yes    SUMMARY OF ONCOLOGIC HISTORY: Oncology History  Malignant neoplasm of upper-outer quadrant of right female breast (HCC)  08/08/2018 Cancer Staging   Staging form: Breast, AJCC 8th Edition - Clinical stage from 08/08/2018: Stage 0 (cTis (DCIS), cN0, cM0, ER: Unknown, PR: Unknown, HER2: Not Assessed) - Signed by Odean Potts, MD on 09/04/2018   08/15/2018 Initial Diagnosis   Routine screening mammogram detected a 0.6cm mass in the right breast, 5cm from the nipple with no axillary adenopathy. Biopsy showed high grade DCIS, with insufficient tumor for receptor studies.   09/21/2018 Surgery   Right lumpectomy on 09/20/18 [facility]) [phone]): intermediate to high grade DCIS with a 0.4cm focus of invasive carcinoma, lymphovascular invasion present, clear margins. ER negative, PR negative, HER-2 positive, Ki67 40%  10/05/2018 lymph node biopsy: 2 lymph nodes negative for carcinoma   09/21/2018 Cancer Staging   Staging form: Breast, AJCC 8th Edition - Pathologic stage from 09/21/2018: Stage IA (pT1a, pN0, cM0, G1, ER-, PR-, HER2+) - Signed by Crawford Morna Pickle, NP on 10/10/2018   11/05/2018 - 12/03/2018 Radiation Therapy   The patient initially received a dose of 40.05 Gy in 15 fractions to the breast using whole-breast tangent fields. This was delivered using a 3-D conformal technique. The pt received a boost delivering an additional 10 Gy in 5 fractions using a electron boost with electrons. The total dose was 50.05 Gy.     CHIEF COMPLIANT:   HISTORY OF  PRESENT ILLNESS: Surveillance of breast cancer  History of Present Illness Yvonne Maynard is an 83 year old female who presents for follow-up of her history of breast cancer.  She experiences decreased energy, which she attributes to aging. Her recent blood work shows normal hemoglobin and white blood cell counts, with no anemia. Her platelet count was previously in the 500s and was diagnosed with JAK2 mutation positive essential thrombocytosis. She is on Hydrea  and is being seen at [facility] oncology clinic and continues regular follow-ups for her blood work. Her recent mammogram was normal.     ALLERGIES:  has no known allergies.  MEDICATIONS:  Current Outpatient Medications  Medication Sig Dispense Refill   aspirin  EC 81 MG tablet Take 81 mg by mouth at bedtime.     Calcium Carbonate-Vit D-Min (CALTRATE 600+D PLUS MINERALS) 600-800 MG-UNIT CHEW      cyanocobalamin (VITAMIN B12) 1000 MCG tablet Take by mouth.     cycloSPORINE  (RESTASIS ) 0.05 % ophthalmic emulsion Place 1 drop into both eyes 2 (two) times daily. 0.4 mL    famotidine (PEPCID) 10 MG tablet Take 10 mg by mouth daily.     fluticasone -salmeterol (ADVAIR) 100-50 MCG/ACT AEPB INHALE ONE PUFF INTO THE LUNGS TWICE DAILY. 60 each 3   hydroxyurea  (HYDREA ) 500 MG capsule Take 1 capsule (500 mg total) by mouth daily. May take with food to minimize GI side effects. 90 capsule 1   montelukast  (SINGULAIR ) 10 MG tablet Take 1 tablet (10 mg total) by mouth daily. 90 tablet 2   Multiple Vitamins-Minerals (CENTRUM SILVER 50+WOMEN PO)      Multiple Vitamins-Minerals (PRESERVISION AREDS 2 PO)  Take by mouth 2 (two) times daily.     Omega-3 Fatty Acids (FISH OIL) 1000 MG CAPS Take 1,000 mg by mouth daily.     risedronate  (ACTONEL ) 35 MG tablet Take 1 tablet (35 mg total) by mouth every 7 (seven) days. with water on empty stomach, nothing by mouth or lie down for next 30 minutes. 12 tablet 3   No current facility-administered  medications for this visit.    PHYSICAL EXAMINATION: ECOG PERFORMANCE STATUS: 1 - Symptomatic but completely ambulatory  Vitals:   10/05/23 1056  BP: (!) 131/59  Pulse: 65  Resp: 17  Temp: 97.6 F (36.4 C)  SpO2: 99%   Filed Weights   10/05/23 1056  Weight: 132 lb 8 oz (60.1 kg)     LABORATORY DATA:  I have reviewed the data as listed    Latest Ref Rng & Units 09/25/2023    8:32 AM 05/01/2023    9:23 AM 04/18/2023    8:13 AM  CMP  Glucose 70 - 99 mg/dL 898  99  95   BUN 8 - 23 mg/dL 12  15  11    Creatinine 0.44 - 1.00 mg/dL 9.19  9.15  9.18   Sodium 135 - 145 mmol/L 140  140  142   Potassium 3.5 - 5.1 mmol/L 4.1  4.1  4.9   Chloride 98 - 111 mmol/L 107  104  105   CO2 22 - 32 mmol/L 25  26  24    Calcium 8.9 - 10.3 mg/dL 9.2  9.3  9.6   Total Protein 6.5 - 8.1 g/dL 6.7  6.9  7.0   Total Bilirubin 0.0 - 1.2 mg/dL 0.8  0.4  0.3   Alkaline Phos 38 - 126 U/L 77  77  97   AST 15 - 41 U/L 19  20  19    ALT 0 - 44 U/L 22  23  21      Lab Results  Component Value Date   WBC 7.7 09/25/2023   HGB 14.2 09/25/2023   HCT 43.0 09/25/2023   MCV 110.0 (H) 09/25/2023   PLT 322 09/25/2023   NEUTROABS 5.5 09/25/2023    ASSESSMENT & PLAN:  Malignant neoplasm of upper-outer quadrant of right female breast (HCC) 09/20/2018:Right lumpectomy on 09/20/18 [facility]): intermediate to high grade DCIS with a 0.4cm focus of invasive carcinoma, lymphovascular invasion present, clear margins. Stage Ia: ER/PR negative HER-2 positive 10/05/2018: Sentinel lymph node biopsy: 0/2 lymph nodes negative 11/06/2018- 12/03/2018: Adjuvant radiation   Treatment plan: No role of adjuvant systemic chemotherapy or antiestrogen therapy because of the size of the tumor and the fact that it is estrogen receptor negative.   Breast cancer surveillance: 1. Breast exam 10/05/2023 benign 2. Mammogram 09/11/2023: Benign at [facility], density category B 3.  Bone density 09/05/2022: T-score -2.6 (used to be -2.1): Osteoporosis:  Recommended bisphosphonate therapy along with calcium and vitamin D   Return to clinic on an as-needed basis.      No orders of the defined types were placed in this encounter.  The patient has a good understanding of the overall plan. she agrees with it. she will call with any problems that may develop before the next visit here. Total time spent: 30 mins including face to face time and time spent for planning, charting and co-ordination of care   Naomi MARLA Chad, MD 10/05/23

## 2023-11-27 ENCOUNTER — Other Ambulatory Visit: Payer: Self-pay | Admitting: *Deleted

## 2023-11-27 ENCOUNTER — Inpatient Hospital Stay: Attending: Physician Assistant

## 2023-11-27 ENCOUNTER — Telehealth: Payer: Self-pay | Admitting: *Deleted

## 2023-11-27 DIAGNOSIS — D45 Polycythemia vera: Secondary | ICD-10-CM

## 2023-11-27 DIAGNOSIS — Z853 Personal history of malignant neoplasm of breast: Secondary | ICD-10-CM | POA: Insufficient documentation

## 2023-11-27 DIAGNOSIS — M810 Age-related osteoporosis without current pathological fracture: Secondary | ICD-10-CM | POA: Insufficient documentation

## 2023-11-27 LAB — CBC WITH DIFFERENTIAL/PLATELET
Abs Immature Granulocytes: 0.03 K/uL (ref 0.00–0.07)
Basophils Absolute: 0.1 K/uL (ref 0.0–0.1)
Basophils Relative: 1 %
Eosinophils Absolute: 0.1 K/uL (ref 0.0–0.5)
Eosinophils Relative: 2 %
HCT: 42.2 % (ref 36.0–46.0)
Hemoglobin: 14 g/dL (ref 12.0–15.0)
Immature Granulocytes: 0 %
Lymphocytes Relative: 18 %
Lymphs Abs: 1.6 K/uL (ref 0.7–4.0)
MCH: 35.7 pg — ABNORMAL HIGH (ref 26.0–34.0)
MCHC: 33.2 g/dL (ref 30.0–36.0)
MCV: 107.7 fL — ABNORMAL HIGH (ref 80.0–100.0)
Monocytes Absolute: 0.6 K/uL (ref 0.1–1.0)
Monocytes Relative: 6 %
Neutro Abs: 6.6 K/uL (ref 1.7–7.7)
Neutrophils Relative %: 73 %
Platelets: 345 K/uL (ref 150–400)
RBC: 3.92 MIL/uL (ref 3.87–5.11)
RDW: 13.8 % (ref 11.5–15.5)
WBC: 9.1 K/uL (ref 4.0–10.5)
nRBC: 0 % (ref 0.0–0.2)

## 2023-11-27 MED ORDER — HYDROXYZINE HCL 10 MG PO TABS: 10.0000 mg | ORAL_TABLET | Freq: Three times a day (TID) | ORAL | 0 refills | Status: DC | PRN

## 2023-11-27 NOTE — Telephone Encounter (Signed)
 Patient came to office this morning.  No symptoms this morning, however she came in to get her lab work and spoke to me about itching and burning periodically to neck, face and sometimes back. States it started when she began taking Hydrea . Per Pleasant Barefoot, PAC will send in hydroxyzine 10 mg Q 8H PRN.  Advised to take first dose in the evening in the event it makes her drowsy.  Verbalized understanding.

## 2023-11-30 DIAGNOSIS — H353122 Nonexudative age-related macular degeneration, left eye, intermediate dry stage: Secondary | ICD-10-CM | POA: Diagnosis not present

## 2023-11-30 DIAGNOSIS — H353211 Exudative age-related macular degeneration, right eye, with active choroidal neovascularization: Secondary | ICD-10-CM | POA: Diagnosis not present

## 2023-11-30 DIAGNOSIS — H43813 Vitreous degeneration, bilateral: Secondary | ICD-10-CM | POA: Diagnosis not present

## 2023-12-05 ENCOUNTER — Other Ambulatory Visit: Payer: Self-pay | Admitting: Family Medicine

## 2023-12-05 ENCOUNTER — Telehealth: Payer: Self-pay

## 2023-12-05 MED ORDER — HYDROCODONE BIT-HOMATROP MBR 5-1.5 MG/5ML PO SOLN: ORAL | 0 refills | Status: AC

## 2023-12-05 NOTE — Telephone Encounter (Signed)
 Need more information How long has she been sick?  What type of symptoms that she having?  If any shortness of breath fevers or difficulty breathing it would be wise to be seen In regards to prescription cough medication with hydrocodone I can send in a limited quantity The prescription medicine uses hydrocodone-Hycodan 1 teaspoon every 6 hours as needed evening use only  Please talk with patient first regarding her symptomatology

## 2023-12-05 NOTE — Telephone Encounter (Signed)
 Copied from CRM #[id number]. Topic: Clinical - Medication Question >> Dec 05, 2023 10:05 AM Gustabo D wrote: Every yr pt get's a bad cough. Pt wants cough medicine with codeine in it. Says it's the only thing to get rid of it. Wants to know if her pcp can call in a cough medicine with codeine in.

## 2023-12-29 ENCOUNTER — Other Ambulatory Visit: Payer: Self-pay | Admitting: Family Medicine

## 2023-12-29 DIAGNOSIS — R053 Chronic cough: Secondary | ICD-10-CM

## 2024-01-15 ENCOUNTER — Encounter: Payer: Self-pay | Admitting: *Deleted
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
## 2024-01-29 ENCOUNTER — Inpatient Hospital Stay

## 2024-01-29 ENCOUNTER — Inpatient Hospital Stay: Attending: Physician Assistant | Admitting: Physician Assistant

## 2024-01-29 VITALS — BP 125/60 | HR 76 | Temp 98.1°F | Resp 18 | Ht 63.5 in | Wt 131.0 lb

## 2024-01-29 DIAGNOSIS — D473 Essential (hemorrhagic) thrombocythemia: Secondary | ICD-10-CM

## 2024-01-29 DIAGNOSIS — K120 Recurrent oral aphthae: Secondary | ICD-10-CM | POA: Insufficient documentation

## 2024-01-29 DIAGNOSIS — Z171 Estrogen receptor negative status [ER-]: Secondary | ICD-10-CM | POA: Diagnosis not present

## 2024-01-29 DIAGNOSIS — L2989 Other pruritus: Secondary | ICD-10-CM | POA: Diagnosis not present

## 2024-01-29 DIAGNOSIS — D471 Chronic myeloproliferative disease: Secondary | ICD-10-CM

## 2024-01-29 DIAGNOSIS — C50411 Malignant neoplasm of upper-outer quadrant of right female breast: Secondary | ICD-10-CM

## 2024-01-29 DIAGNOSIS — Z853 Personal history of malignant neoplasm of breast: Secondary | ICD-10-CM | POA: Insufficient documentation

## 2024-01-29 DIAGNOSIS — D45 Polycythemia vera: Secondary | ICD-10-CM

## 2024-01-29 DIAGNOSIS — Z7982 Long term (current) use of aspirin: Secondary | ICD-10-CM | POA: Insufficient documentation

## 2024-01-29 DIAGNOSIS — Z1589 Genetic susceptibility to other disease: Secondary | ICD-10-CM | POA: Diagnosis not present

## 2024-01-29 LAB — CBC WITH DIFFERENTIAL/PLATELET
Abs Immature Granulocytes: 0.04 K/uL (ref 0.00–0.07)
Basophils Absolute: 0.1 K/uL (ref 0.0–0.1)
Basophils Relative: 1 %
Eosinophils Absolute: 0.2 K/uL (ref 0.0–0.5)
Eosinophils Relative: 2 %
HCT: 44.4 % (ref 36.0–46.0)
Hemoglobin: 14.6 g/dL (ref 12.0–15.0)
Immature Granulocytes: 0 %
Lymphocytes Relative: 18 %
Lymphs Abs: 1.7 K/uL (ref 0.7–4.0)
MCH: 35.4 pg — ABNORMAL HIGH (ref 26.0–34.0)
MCHC: 32.9 g/dL (ref 30.0–36.0)
MCV: 107.8 fL — ABNORMAL HIGH (ref 80.0–100.0)
Monocytes Absolute: 0.6 K/uL (ref 0.1–1.0)
Monocytes Relative: 6 %
Neutro Abs: 7.1 K/uL (ref 1.7–7.7)
Neutrophils Relative %: 73 %
Platelets: 335 K/uL (ref 150–400)
RBC: 4.12 MIL/uL (ref 3.87–5.11)
RDW: 14.1 % (ref 11.5–15.5)
WBC: 9.7 K/uL (ref 4.0–10.5)
nRBC: 0 % (ref 0.0–0.2)

## 2024-01-29 LAB — LACTATE DEHYDROGENASE: LDH: 218 U/L (ref 105–235)

## 2024-01-29 LAB — COMPREHENSIVE METABOLIC PANEL WITH GFR
ALT: 27 U/L (ref 0–44)
AST: 24 U/L (ref 15–41)
Albumin: 4.5 g/dL (ref 3.5–5.0)
Alkaline Phosphatase: 92 U/L (ref 38–126)
Anion gap: 13 (ref 5–15)
BUN: 12 mg/dL (ref 8–23)
CO2: 24 mmol/L (ref 22–32)
Calcium: 9.6 mg/dL (ref 8.9–10.3)
Chloride: 105 mmol/L (ref 98–111)
Creatinine, Ser: 0.8 mg/dL (ref 0.44–1.00)
GFR, Estimated: >60 mL/min
Glucose, Bld: 101 mg/dL — ABNORMAL HIGH (ref 70–99)
Potassium: 4.4 mmol/L (ref 3.5–5.1)
Sodium: 142 mmol/L (ref 135–145)
Total Bilirubin: 0.4 mg/dL (ref 0.0–1.2)
Total Protein: 7.3 g/dL (ref 6.5–8.1)

## 2024-01-29 MED ORDER — DULOXETINE HCL 30 MG PO CPEP: 30.0000 mg | ORAL_CAPSULE | Freq: Every day | ORAL | 2 refills | Status: AC

## 2024-01-29 NOTE — Patient Instructions (Signed)
 [facility] at [facility] Discharge Instructions  You were seen today by Pleasant Barefoot PA-C for your follow-up visit to discuss your polycythemia.  Your platelet and hemoglobin/red blood cell levels look great!  Continue Hydrea  (hydroxyurea ) 1 tablet (500 mg) each day of the week.  Many of your symptoms are related to your polycythemia.  Consider the following strategies to help manage your symptoms. Itching after a shower:  STOP taking hydroxyzine  (Atarax ), since this was not helping. START taking duloxetine  30 mg once daily. (If this dose is well-tolerated, we can increase this dose at your next visit.) Monitor for common side effects of duloxetine  including nausea, constipation, and elevated blood pressure. Night sweats: Supportive measures such as cool sleeping environment and moisture wicking clothing can provide relief. Leg cramps and leg pain: Try stretching exercises (such as calf raises) before bedtime.  You could also use stationary bicycle for a few minutes before bedtime.  Make sure you are drinking plenty of water.  Other symptoms (such as mouth sores and skin sores) can be related to your Hydrea .  Let me know if these occur.  We will recheck your blood count every 2 months.  FOLLOW-UP APPOINTMENT: Same-day labs and office visit in 4 months.  However, if you have any changes or concerns before that time, do not hesitate to reach out and request an earlier appointment if needed.   ** Thank you for trusting me with your healthcare!  I strive to provide all of my patients with quality care at each visit.  If you receive a survey for this visit, I would be so grateful to you for taking the time to provide feedback.  Thank you in advance!  ~ Corryn Madewell                                        Dr. Mickiel Davonna Pleasant Barefoot, PA-C          Delon Hope, NP   - - - - - - - - - - - - - - - - - -    Thank you for choosing [facility] at [facility] to provide your oncology and hematology care.  To afford each patient quality time with our provider, please arrive at least 15 minutes before your scheduled appointment time.   If you have a lab appointment with the Cancer Center please come in thru the Main Entrance and check in at the main information desk.  You need to re-schedule your appointment should you arrive 10 or more minutes late.  We strive to give you quality time with our providers, and arriving late affects you and other patients whose appointments are after yours.  Also, if you no show three or more times for appointments you may be dismissed from the clinic at the providers discretion.     Again, thank you for choosing [facility].  Our hope is that these requests will decrease the amount of time that you wait before being seen by our physicians.       _____________________________________________________________  Should you have questions after your visit to [facility], please contact our office at [phone] and follow the prompts.  Our office hours are 8:00 a.m. and 4:30 p.m. Monday - Friday.  Please note that voicemails left after  4:00 p.m. may not be returned until the following business day.  We are closed weekends and major holidays.  You do have access to a nurse 24-7, just call the main number to the clinic [phone] and do not press any options, hold on the line and a nurse will answer the phone.    For prescription refill requests, have your pharmacy contact our office and allow 72 hours.    Due to Covid, you will need to wear a mask upon entering the hospital. If you do not have a mask, a mask will be given to you at the Main Entrance upon arrival. For doctor visits, patients may have 1 support person age 44 or older with them. For treatment visits, patients can not have anyone with them due to social distancing guidelines and our immunocompromised population.

## 2024-04-01 ENCOUNTER — Inpatient Hospital Stay: Attending: Physician Assistant

## 2024-05-28 ENCOUNTER — Inpatient Hospital Stay: Attending: Physician Assistant | Admitting: Physician Assistant

## 2024-05-28 ENCOUNTER — Inpatient Hospital Stay
